# Patient Record
Sex: Male | Born: 1940 | Race: Black or African American | Hispanic: No | Marital: Married | Smoking: Former smoker
Health system: Southern US, Community
[De-identification: ages and names within clinical notes are randomized; demographics above are authoritative.]

## PROBLEM LIST (undated history)

## (undated) DIAGNOSIS — Z8669 Personal history of other diseases of the nervous system and sense organs: Secondary | ICD-10-CM

## (undated) DIAGNOSIS — E119 Type 2 diabetes mellitus without complications: Secondary | ICD-10-CM

## (undated) DIAGNOSIS — I6529 Occlusion and stenosis of unspecified carotid artery: Secondary | ICD-10-CM

## (undated) DIAGNOSIS — E785 Hyperlipidemia, unspecified: Secondary | ICD-10-CM

## (undated) DIAGNOSIS — I10 Essential (primary) hypertension: Secondary | ICD-10-CM

## (undated) HISTORY — DX: Personal history of other diseases of the nervous system and sense organs: Z86.69

## (undated) HISTORY — DX: Occlusion and stenosis of unspecified carotid artery: I65.29

## (undated) HISTORY — DX: Hyperlipidemia, unspecified: E78.5

## (undated) HISTORY — DX: Type 2 diabetes mellitus without complications: E11.9

## (undated) HISTORY — DX: Essential (primary) hypertension: I10

---

## 1998-04-22 ENCOUNTER — Ambulatory Visit (HOSPITAL_COMMUNITY): Admission: RE | Admit: 1998-04-22 | Discharge: 1998-04-22 | Payer: Self-pay | Admitting: General Surgery

## 1999-12-16 ENCOUNTER — Encounter: Payer: Self-pay | Admitting: *Deleted

## 1999-12-16 ENCOUNTER — Inpatient Hospital Stay (HOSPITAL_COMMUNITY): Admission: EM | Admit: 1999-12-16 | Discharge: 1999-12-17 | Payer: Self-pay | Admitting: *Deleted

## 2009-08-12 HISTORY — PX: COLONOSCOPY: SHX174

## 2010-11-17 ENCOUNTER — Ambulatory Visit (INDEPENDENT_AMBULATORY_CARE_PROVIDER_SITE_OTHER): Payer: Medicare Other | Admitting: Family Medicine

## 2010-11-17 ENCOUNTER — Encounter: Payer: Self-pay | Admitting: Family Medicine

## 2010-11-17 VITALS — BP 140/90 | HR 60 | Temp 98.3°F | Ht 67.0 in | Wt 206.0 lb

## 2010-11-17 DIAGNOSIS — M79604 Pain in right leg: Secondary | ICD-10-CM | POA: Insufficient documentation

## 2010-11-17 DIAGNOSIS — R683 Clubbing of fingers: Secondary | ICD-10-CM

## 2010-11-17 DIAGNOSIS — I10 Essential (primary) hypertension: Secondary | ICD-10-CM | POA: Insufficient documentation

## 2010-11-17 DIAGNOSIS — M79609 Pain in unspecified limb: Secondary | ICD-10-CM

## 2010-11-17 DIAGNOSIS — M79606 Pain in leg, unspecified: Secondary | ICD-10-CM

## 2010-11-17 DIAGNOSIS — L608 Other nail disorders: Secondary | ICD-10-CM

## 2010-11-17 MED ORDER — HYDROCHLOROTHIAZIDE 25 MG PO TABS: 25.0000 mg | ORAL_TABLET | Freq: Every day | ORAL | Status: DC

## 2010-11-17 MED ORDER — ATENOLOL 50 MG PO TABS: 50.0000 mg | ORAL_TABLET | Freq: Every day | ORAL | Status: DC

## 2010-11-17 NOTE — Assessment & Plan Note (Signed)
Borderline on check today. On atenolol daily and HCTZ daily. Not big fan of atenolol daily. Will await records and review prior to making any changes today (new pt, first time meeting new doctor). ROS neg.

## 2010-11-17 NOTE — Assessment & Plan Note (Signed)
Will need to check O2 sat next visit, consider xray.

## 2010-11-17 NOTE — Assessment & Plan Note (Signed)
Seems to be stemming from lower back.  Anticipate component of DDD.  No claudication sxs.   Recommended tylenol (pt states no h/o liver problems) instead of alleve to prevent worsening of HTN.   Stretching/strengthening exercises for lower back pain from [facility] pt advisor provided today. If not improved, begin with lumbar xrays. Neuro nonfocal today, no red flags.  Also - followed by urologist for yearly prostate check.  Will see if records in old PCP chart regarding reason for f/u with uro.  No fmhx prostate cancer, denies personal hx cancer.

## 2010-11-17 NOTE — Progress Notes (Addendum)
Subjective:    Patient ID: Jeffrey Villarreal, male    DOB: [birth year], 70 y.o.   MRN: [id number]  HPI CC: new medicare patient, establish  Presents alone today.  Previously saw [facility] on [address].  Leg pain - >1 mo h/o this.  When walking, gets pain lower back L>R then may travel bilateral outer thighs and down to anterior legs.  Worse when standing from sitting as well as first steps.  No numbness or weakness in legs.  Some lower back pain left sided.  No bowel/bladder accidents.  No fevers/chills.  No claudication sxs.  Worse in am first steps stiff.  Takes alleve prn.  No other joint pains, no erythema, swelling or redness of joints.  Preventative: Colonoscopy 05/2010, told good for 10 years. Prostate - gets yearly in September. Gets done at [facility]. Tetanus - unsure Unsure last CPE. Blood work a few months ago.  Medications and allergies reviewed and updated in chart. Past Medical History  Diagnosis Date  . Hypertension    No past surgical history on file.  reports that he quit smoking about 25 years ago. He has never used smokeless tobacco. He reports that he does not drink alcohol or use illicit drugs. family history includes Hypertension in his father and mother.  There is no history of Cancer, and Coronary artery disease, and Stroke, and Diabetes, and Kidney disease, and Hyperlipidemia, . No Known Allergies  Review of Systems  Constitutional: Negative for fever, chills, activity change, appetite change, fatigue and unexpected weight change.  HENT: Negative for hearing loss and neck pain.   Eyes: Negative for visual disturbance.  Respiratory: Negative for cough, choking, chest tightness, shortness of breath and wheezing.   Cardiovascular: Negative for chest pain, palpitations and leg swelling.  Gastrointestinal: Positive for anal bleeding (h/o hemmorhoids, seen at [facility], resolved). Negative for nausea, vomiting, abdominal pain, diarrhea, constipation, blood in stool  and abdominal distention.  Genitourinary: Negative for hematuria and difficulty urinating.  Musculoskeletal: Negative for myalgias and arthralgias.  Skin: Negative for rash.  Neurological: Negative for dizziness, seizures, syncope and headaches.  Hematological: Does not bruise/bleed easily.  Psychiatric/Behavioral: Negative for dysphoric mood. The patient is not nervous/anxious.        Objective:   Physical Exam  Nursing note and vitals reviewed. Constitutional: He is oriented to person, place, and time. He appears well-developed and well-nourished. No distress.  HENT:  Head: Normocephalic and atraumatic.  Right Ear: External ear normal.  Left Ear: External ear normal.  Nose: Nose normal.  Mouth/Throat: Oropharynx is clear and moist.       Cerumen covering TMs bilateraly  Eyes: Conjunctivae and EOM are normal. Pupils are equal, round, and reactive to light.  Neck: Normal range of motion. Neck supple. Carotid bruit is not present. No thyromegaly present.  Cardiovascular: Normal rate, regular rhythm, normal heart sounds and intact distal pulses.   No murmur heard. Pulses:      Radial pulses are 2+ on the right side, and 2+ on the left side.  Pulmonary/Chest: Effort normal and breath sounds normal. No respiratory distress. He has no wheezes. He has no rales.  Abdominal: Soft. Bowel sounds are normal. He exhibits no distension and no mass. There is no tenderness. There is no rebound and no guarding.       No abd/renal bruits  Musculoskeletal: Normal range of motion.       No midline spinal tenderness.  No paraspinous mm tenderness/tightness. No SIJ, GTB pain. Neg SLR  bilaterally. FROM at hip and knee. FROM at spine. Minimal tenderness with testing bilat hips int rotation.  Lymphadenopathy:    He has no cervical adenopathy.  Neurological: He is alert and oriented to person, place, and time. He has normal strength. No sensory deficit. Coordination normal.  Reflex Scores:       Patellar reflexes are 2+ on the right side and 2+ on the left side.      Achilles reflexes are 1+ on the right side and 1+ on the left side.      CN grossly intact, station and gait intact  Skin: Skin is warm and dry. No rash noted.       clubbing  Psychiatric: He has a normal mood and affect. His behavior is normal. Judgment and thought content normal.          Assessment & Plan:

## 2010-11-17 NOTE — Patient Instructions (Addendum)
Return in 1-2 months for blood pressure recheck with me. I will request records from [facility]. Continue meds for now. Call us with questions.  Good to meet you today. For back - tylenol extra strength 500mg  2-3 times daily as needed.  Stretching exercises provided.  Reassess next visit.

## 2010-11-27 ENCOUNTER — Telehealth: Payer: Self-pay | Admitting: *Deleted

## 2010-11-27 MED ORDER — TRAMADOL HCL 50 MG PO TABS: 50.0000 mg | ORAL_TABLET | Freq: Two times a day (BID) | ORAL | Status: AC | PRN

## 2010-11-27 NOTE — Telephone Encounter (Signed)
Patient was seen on 11-18-10. Wife says that the tylenol is not helping with patients back pain at all. She is asking if he can get something called in. Please advise. Uses [facility] on [address].

## 2010-11-27 NOTE — Telephone Encounter (Signed)
Is he taking tylenol 500mg  tid?  If so may increase to 1000mg  tid.  May add alleve to tylenol as well. If not helping, have sent stronger medicine (tramadol) to pharmacy, sedation precautions though.

## 2010-11-27 NOTE — Telephone Encounter (Signed)
Patient notified. He was taking 500 mg tid, he is going to increase to 1000 tid before trying the tramadol.

## 2011-01-22 ENCOUNTER — Ambulatory Visit: Payer: Medicare Other | Admitting: Family Medicine

## 2011-01-25 ENCOUNTER — Ambulatory Visit: Payer: Medicare Other | Admitting: Family Medicine

## 2011-03-18 ENCOUNTER — Ambulatory Visit: Payer: Medicare Other | Admitting: Family Medicine

## 2011-06-15 HISTORY — PX: CATARACT EXTRACTION: SUR2

## 2011-08-05 ENCOUNTER — Other Ambulatory Visit: Payer: Self-pay | Admitting: Family Medicine

## 2011-08-05 NOTE — Telephone Encounter (Signed)
Triage Record Num: [id number] Operator: Jeraldine Loots Patient Name: Jeffrey Villarreal Call Date & Time: 08/05/2011 12:13:31PM Patient Phone: [phone] PCP: Eustaquio Boyden Patient Gender: Male PCP Fax : [phone] Patient DOB: 04-01-1941 Practice Name: [facility] Reason for Call: Caller: Ruby/Spouse; CB#: [phone]. [facility] on [address]. Needs a refill on the HCTZ and Atenolol. He is completely out and has been x 3 days. No future appt. scheduled. She will have him call to make an appt. Protocol(s) Used: Office Note Recommended Outcome per Protocol: Information Noted and Sent to Office Reason for Outcome: Caller information to office

## 2011-08-05 NOTE — Telephone Encounter (Signed)
Already refilled

## 2011-08-26 ENCOUNTER — Encounter: Payer: Self-pay | Admitting: Family Medicine

## 2011-08-26 ENCOUNTER — Ambulatory Visit (INDEPENDENT_AMBULATORY_CARE_PROVIDER_SITE_OTHER): Payer: Medicare Other | Admitting: Family Medicine

## 2011-08-26 VITALS — BP 140/88 | HR 60 | Temp 97.8°F | Wt 201.5 lb

## 2011-08-26 DIAGNOSIS — I10 Essential (primary) hypertension: Secondary | ICD-10-CM

## 2011-08-26 DIAGNOSIS — H6120 Impacted cerumen, unspecified ear: Secondary | ICD-10-CM

## 2011-08-26 DIAGNOSIS — M79606 Pain in leg, unspecified: Secondary | ICD-10-CM

## 2011-08-26 DIAGNOSIS — M79609 Pain in unspecified limb: Secondary | ICD-10-CM

## 2011-08-26 MED ORDER — ATENOLOL 50 MG PO TABS: 50.0000 mg | ORAL_TABLET | Freq: Every day | ORAL | Status: DC

## 2011-08-26 MED ORDER — NAPROXEN 500 MG PO TABS: ORAL_TABLET | ORAL | Status: DC

## 2011-08-26 MED ORDER — HYDROCHLOROTHIAZIDE 25 MG PO TABS: 25.0000 mg | ORAL_TABLET | Freq: Every day | ORAL | Status: DC

## 2011-08-26 NOTE — Assessment & Plan Note (Signed)
Exam today consistent with left sciatica. Doubt stemming from lumbar spine but possibility. No red flags today. Endorses 2 mo h/o sxs. Treat with NSAIDs, stretching exercises for piriformis syndrome provided from [facility] pt advisor. If not better with this, check lumbar xrays, consider PT.

## 2011-08-26 NOTE — Assessment & Plan Note (Signed)
Chronic, stable. Refilled meds.  Due for blood work.  Asked to return fasting for blood work, afterwards for [employer] visit.

## 2011-08-26 NOTE — Progress Notes (Signed)
  Subjective:    Patient ID: Jeffrey Villarreal, male    DOB: 04/11/1941, 71 y.o.   MRN: [id number]  HPI CC: L leg pain, check hearing  HTN - keeps eye at home.  Running ok.  No HA, vision changes, CP/tightness, SOB, leg swelling.  Requests 1 year script renewal of meds.  L leg pain - for 2 months now - last visit here 11/2010 also endorsed leg pain.  This pain starts in lower back and then travels down left leg to mid calf, pain described as ache.  Has been taking tylenol/aleve.  Denies tingling or numbness down leg.  Denies fevers/chills, bowel/bladder incontinence.  Occasionally unstable on left leg.  Denies inciting injury/trauma.  The more he walks the better pain gets.  pain worse when standing up from prolonged sitting.  Hearing - trouble hearing out of left ear.  Normal screen on right today (25Hz  at all decibels), failed left side today.  Endorses blood in stool, told hemorrhoid related.  Colonoscopy done 2 yrs ago by Dr. Elnoria Howard in [place].  Told good for 10 yrs. Prostate - sees urologist at [facility].  Told good exam 02/2011.  Never received records from [facility].  Review of Systems Per HPI    Objective:   Physical Exam  Vitals reviewed. Constitutional: He appears well-developed and well-nourished. No distress.  HENT:  Head: Normocephalic and atraumatic.  Right Ear: Hearing and external ear normal.  Left Ear: External ear normal. Decreased hearing is noted.  Mouth/Throat: Oropharynx is clear and moist. No oropharyngeal exudate.       Bilateral cerumen impaction L>R   Eyes: Conjunctivae and EOM are normal. Pupils are equal, round, and reactive to light. No scleral icterus.  Neck: Normal range of motion. Neck supple. Carotid bruit is not present.  Cardiovascular: Normal rate, regular rhythm, normal heart sounds and intact distal pulses.   No murmur heard. Pulmonary/Chest: Breath sounds normal. No respiratory distress. He has no wheezes. He has no rales.  Abdominal: Soft. There is no  tenderness.       No abd/renal bruits  Musculoskeletal:       No midline spine pain. No paraspinous mm tenderness. No pain with palpation at GTB, SIJ. + tender L sciatic notch. Neg SLR bilaterally. No pain with int/ext rotation at hip. Neg FABER test.  Neurological: He has normal strength. No sensory deficit. Coordination and gait normal.       Decreased DTRs BLE symmetrical  Skin: Skin is warm and dry. No rash noted.       Assessment & Plan:

## 2011-08-26 NOTE — Patient Instructions (Signed)
You're due for physical - return at your convenience for this, return a few days prior fasting for blood work. Sign release form for records of colonoscopy (Dr. Elnoria Howard) and [facility] For hearing - cleaned out left ear. For left leg - I think you have left sciatica.  Treat with course of naprosyn twice daily with food for 5-7 days then as needed.  Do stretching exercises provided.  If not better in 3-4 weeks, let us know.  If any worsening or numbness or weakness of leg, please return sooner. Good to see you today, call us with questions.

## 2011-08-26 NOTE — Assessment & Plan Note (Signed)
L ear canal irrigated.  Resolved hearing loss. Still unable to visualize TM, asked pt to use dilute H2O2 at home 3-4 drops per week for next 1-2 mo. Will recheck next visit.

## 2011-09-15 ENCOUNTER — Other Ambulatory Visit: Payer: Self-pay | Admitting: Family Medicine

## 2011-09-15 DIAGNOSIS — Z125 Encounter for screening for malignant neoplasm of prostate: Secondary | ICD-10-CM

## 2011-09-15 DIAGNOSIS — I10 Essential (primary) hypertension: Secondary | ICD-10-CM

## 2011-09-17 ENCOUNTER — Other Ambulatory Visit (INDEPENDENT_AMBULATORY_CARE_PROVIDER_SITE_OTHER): Payer: Medicare Other

## 2011-09-17 DIAGNOSIS — Z125 Encounter for screening for malignant neoplasm of prostate: Secondary | ICD-10-CM

## 2011-09-17 DIAGNOSIS — I10 Essential (primary) hypertension: Secondary | ICD-10-CM

## 2011-09-17 LAB — CBC WITH DIFFERENTIAL/PLATELET
Basophils Relative: 0.4 % (ref 0.0–3.0)
Eosinophils Absolute: 0.4 10*3/uL (ref 0.0–0.7)
Lymphocytes Relative: 29.5 % (ref 12.0–46.0)
MCHC: 33.1 g/dL (ref 30.0–36.0)
MCV: 85.1 fl (ref 78.0–100.0)
Monocytes Absolute: 0.4 10*3/uL (ref 0.1–1.0)
Neutrophils Relative %: 56.1 % (ref 43.0–77.0)
Platelets: 258 10*3/uL (ref 150.0–400.0)
RBC: 5.02 Mil/uL (ref 4.22–5.81)
WBC: 5.5 10*3/uL (ref 4.5–10.5)

## 2011-09-17 LAB — LIPID PANEL: Cholesterol: 204 mg/dL — ABNORMAL HIGH (ref 0–200)

## 2011-09-17 LAB — COMPREHENSIVE METABOLIC PANEL
ALT: 41 U/L (ref 0–53)
Albumin: 3.8 g/dL (ref 3.5–5.2)
BUN: 15 mg/dL (ref 6–23)
CO2: 27 mEq/L (ref 19–32)
Calcium: 9.1 mg/dL (ref 8.4–10.5)
Chloride: 103 mEq/L (ref 96–112)
Creatinine, Ser: 1.3 mg/dL (ref 0.4–1.5)
GFR: 70.64 mL/min (ref >60.00)
Potassium: 3.7 mEq/L (ref 3.5–5.1)

## 2011-09-17 LAB — LDL CHOLESTEROL, DIRECT: Direct LDL: 144.5 mg/dL

## 2011-09-18 LAB — PSA, MEDICARE: PSA: 1.75 ng/mL (ref <=4.00)

## 2011-09-19 ENCOUNTER — Encounter: Payer: Self-pay | Admitting: Family Medicine

## 2011-09-24 ENCOUNTER — Encounter: Payer: Self-pay | Admitting: Family Medicine

## 2011-09-24 ENCOUNTER — Ambulatory Visit (INDEPENDENT_AMBULATORY_CARE_PROVIDER_SITE_OTHER): Payer: Medicare Other | Admitting: Family Medicine

## 2011-09-24 VITALS — BP 140/80 | HR 58 | Temp 97.5°F | Ht 67.0 in | Wt 200.4 lb

## 2011-09-24 DIAGNOSIS — I10 Essential (primary) hypertension: Secondary | ICD-10-CM

## 2011-09-24 DIAGNOSIS — E1169 Type 2 diabetes mellitus with other specified complication: Secondary | ICD-10-CM | POA: Insufficient documentation

## 2011-09-24 DIAGNOSIS — Z0000 Encounter for general adult medical examination without abnormal findings: Secondary | ICD-10-CM

## 2011-09-24 DIAGNOSIS — H918X9 Other specified hearing loss, unspecified ear: Secondary | ICD-10-CM

## 2011-09-24 DIAGNOSIS — R7309 Other abnormal glucose: Secondary | ICD-10-CM

## 2011-09-24 DIAGNOSIS — R739 Hyperglycemia, unspecified: Secondary | ICD-10-CM

## 2011-09-24 DIAGNOSIS — H6120 Impacted cerumen, unspecified ear: Secondary | ICD-10-CM

## 2011-09-24 DIAGNOSIS — E785 Hyperlipidemia, unspecified: Secondary | ICD-10-CM

## 2011-09-24 NOTE — Progress Notes (Signed)
Subjective:    Patient ID: Jeffrey Villarreal, male    DOB: 1940-08-31, 71 y.o.   MRN: [id number]  HPI CC: medicare wellness  No concerns today.  Leg pain much improved, intermittent use of NSAID.  No falls in last year. Denies depression, anhedonia.  Caffeine: 1 cup coffee. Lives with wife, step-daughter (29yo), no pets Occupation: retired Activity: walks daily Diet: good water, fruits/vegetables daily  Preventative:  Colonoscopy 08/2009, rpt due 10 yrs (Dr. Elnoria Howard) Prostate - gets yearly in September. Gets done at [facility].  No h/o prostate problems, no family hx prostate cancer.  Nocturia x1.  Regular stream. Tetanus - unsure, thinks 3-4 yrs ago (?2009). zostavax - never received.  Not currently intersted Pneumovax - never received.  Discussed reasons to immunize.  Currently declines. Never taken flu shot. Hearing - doing well.  Cerumen disimpaction performed last visit.  Denies problems with hearing. Vision doing well - 07/2011 vision screen, told normal.  Cataract removed this year.  Placed in glasses.  Thinks checked for glaucoma. Advanced directives - no HCPOA . Would want wife to make decisions for him.  Provided with advanced directive handout.  Medications and allergies reviewed and updated in chart.  Past histories reviewed and updated if relevant as below. Patient Active Problem List  Diagnoses  . Hypertension  . Leg pain  . Clubbing of nails  . 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 Hypertension   . Hyperglycemia    Past Surgical History  Procedure Date  . Colonoscopy 08/2009    large int hemorrhoids, benign lymphoid polyp, rpt due 10 yrs Elnoria Howard)   History  Substance Use Topics  . Smoking status: Former Smoker    Quit date: 06/14/1985  . Smokeless tobacco: Never Used  . Alcohol Use: No     Quit 1987   Family History  Problem Relation Age of Onset  . Hypertension Mother   . Hypertension Father   .  Cancer Neg Hx   . Coronary artery disease Neg Hx   . Stroke Neg Hx   . Diabetes Neg Hx   . Kidney disease Neg Hx   . Hyperlipidemia Neg Hx    No Known Allergies Current Outpatient Prescriptions on File Prior to Visit  Medication Sig Dispense Refill  . atenolol (TENORMIN) 50 MG tablet Take 1 tablet (50 mg total) by mouth daily.  90 tablet  3  . hydrochlorothiazide (HYDRODIURIL) 25 MG tablet Take 1 tablet (25 mg total) by mouth daily.  90 tablet  3  . naproxen (NAPROSYN) 500 MG tablet Take one po bid x 1 week then prn pain, take with food  60 tablet  0    Review of Systems  Constitutional: Negative for fever, chills, activity change, appetite change, fatigue and unexpected weight change.  HENT: Negative for hearing loss and neck pain.   Eyes: Negative for visual disturbance.  Respiratory: Negative for cough, chest tightness, shortness of breath and wheezing.   Cardiovascular: Negative for chest pain, palpitations and leg swelling.  Gastrointestinal: Negative for nausea, vomiting, abdominal pain, diarrhea, constipation, blood in stool and abdominal distention.  Genitourinary: Negative for hematuria and difficulty urinating.  Musculoskeletal: Negative for myalgias and arthralgias.  Skin: Negative for rash.  Neurological: Negative for dizziness, seizures, syncope and headaches.  Hematological: Does not bruise/bleed easily.  Psychiatric/Behavioral: Negative for dysphoric mood. The patient is not nervous/anxious.        Objective:   Physical Exam  Nursing note and vitals reviewed. Constitutional: He is oriented to person, place, and time. He appears well-developed and well-nourished. No distress.  HENT:  Head: Normocephalic and atraumatic.  Right Ear: External ear normal.  Left Ear: Tympanic membrane and external ear normal.  Nose: Nose normal. No mucosal edema or rhinorrhea.  Mouth/Throat: Uvula is midline, oropharynx is clear and moist and mucous membranes are normal. No  oropharyngeal exudate, posterior oropharyngeal edema, posterior oropharyngeal erythema or tonsillar abscesses.       Cerumen impaction bilaterally but able to visualize L TM.  Attempted cerumen disimpaction with curette, pt did not tolerate well so D/C without full cleaning.  Eyes: Conjunctivae and EOM are normal. Pupils are equal, round, and reactive to light. No scleral icterus.  Neck: Normal range of motion. Neck supple. Carotid bruit is not present.  Cardiovascular: Normal rate, regular rhythm, normal heart sounds and intact distal pulses.   No murmur heard. Pulses:      Radial pulses are 2+ on the right side, and 2+ on the left side.  Pulmonary/Chest: Effort normal and breath sounds normal. No respiratory distress. He has no wheezes. He has no rales.  Abdominal: Soft. Bowel sounds are normal. He exhibits no distension and no mass. There is no tenderness. There is no rebound and no guarding.  Genitourinary:       Deferred: per pt gets yearly exam with urology every september  Musculoskeletal: Normal range of motion. He exhibits no edema.  Lymphadenopathy:    He has no cervical adenopathy.  Neurological: He is alert and oriented to person, place, and time.       CN grossly intact, station and gait intact  Skin: Skin is warm and dry. No rash noted.  Psychiatric: He has a normal mood and affect. His behavior is normal. Judgment and thought content normal.      Assessment & Plan:

## 2011-09-24 NOTE — Patient Instructions (Addendum)
Low cholesterol diet handout provided. Sugar was too high - possible prediabetes - work on backing off of sweets, sugars, too many carbs. Look into information on advanced directives provided today. Return in 6 months for follow up, a few days prior for fasting blood work to recheck sugar and cholesterol. you did not hear well out of left ear today but I was unable to fully clean out all the wax in your ears. Let us know how ears are doing - if noted worsening hearing, let me know for referral to ENT doctor. Good to see you today, call us with questions.

## 2011-09-25 DIAGNOSIS — E785 Hyperlipidemia, unspecified: Secondary | ICD-10-CM | POA: Insufficient documentation

## 2011-09-25 DIAGNOSIS — Z0000 Encounter for general adult medical examination without abnormal findings: Secondary | ICD-10-CM | POA: Insufficient documentation

## 2011-09-25 NOTE — Assessment & Plan Note (Signed)
Again unable to fully clean ears with curette 2/2 poor tolerance by patient. Last visit passed hearing screen, this visit fails. Anticipate due to cerumen. Discussed referrral to ENT for cleaning, as pt doesn't notice problem with hearing, prefers to monitor for now.   If changes mind, will let me know.

## 2011-09-25 NOTE — Assessment & Plan Note (Addendum)
Reviewed blood work, discussed goal LDL <130. rtc 6 mo for recheck. Discussed low chol diet and provided with handout

## 2011-09-25 NOTE — Assessment & Plan Note (Signed)
Discussed elevated sugar - return in 6 months for recheck chol and sugar - check A1c. rec dietary changes to lower glucose.

## 2011-09-25 NOTE — Assessment & Plan Note (Addendum)
I have personally reviewed the Medicare Annual Wellness questionnaire and have noted 1. The patient's medical and social history 2. Their use of alcohol, tobacco or illicit drugs 3. Their current medications and supplements 4. The patient's functional ability including ADL's, fall risks, home safety risks and hearing or visual impairment. 5. Diet and physical activities 6. Evidence for depression or mood disorders The patients weight, height, BMI have been recorded in the chart.  Hearing and vision has been addressed. I have made referrals, counseling and provided education to the patient based review of the above and I have provided the pt with a written personalized care plan for preventive services. See scanned form.  Preventative protocols reviewed and updated unless pt declined. Blood work reviewed in detail. Hearing- see below. Vision - gets yearly ophtho screen since cataract surgery. Prostate exam with [facility] - will fax PSA result to them. Return in 6 months for recheck glu and LDL.

## 2011-09-25 NOTE — Assessment & Plan Note (Signed)
Stable on current regimen, continue. 

## 2011-11-05 ENCOUNTER — Ambulatory Visit (INDEPENDENT_AMBULATORY_CARE_PROVIDER_SITE_OTHER)
Admission: RE | Admit: 2011-11-05 | Discharge: 2011-11-05 | Disposition: A | Discharge status: Home / Self Care | Payer: Medicare Other | Source: Ambulatory Visit | Attending: Family Medicine | Admitting: Family Medicine

## 2011-11-05 ENCOUNTER — Encounter: Payer: Self-pay | Admitting: Family Medicine

## 2011-11-05 ENCOUNTER — Ambulatory Visit (INDEPENDENT_AMBULATORY_CARE_PROVIDER_SITE_OTHER): Payer: Medicare Other | Admitting: Family Medicine

## 2011-11-05 VITALS — BP 114/72 | HR 60 | Temp 98.2°F | Wt 205.5 lb

## 2011-11-05 DIAGNOSIS — M79605 Pain in left leg: Secondary | ICD-10-CM

## 2011-11-05 DIAGNOSIS — M79609 Pain in unspecified limb: Secondary | ICD-10-CM

## 2011-11-05 DIAGNOSIS — M79604 Pain in right leg: Secondary | ICD-10-CM

## 2011-11-05 MED ORDER — CYCLOBENZAPRINE HCL 10 MG PO TABS: 10.0000 mg | ORAL_TABLET | Freq: Two times a day (BID) | ORAL | Status: AC | PRN

## 2011-11-05 MED ORDER — NAPROXEN 500 MG PO TABS: 500.0000 mg | ORAL_TABLET | Freq: Two times a day (BID) | ORAL | Status: DC

## 2011-11-05 NOTE — Patient Instructions (Addendum)
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 Treat with flexeril (muscle relaxant that may make you sleepy so take at home and don't drive with this) and refilled naprosyn.  If naprosyn is not helping, let me know for stronger pain medicine. Pass by Marion's office for referral to physical therapy. I also recommend starting regular pool exercise as this will likely help overall. Return in 1-2 months for follow up.

## 2011-11-05 NOTE — Progress Notes (Signed)
  Subjective:    Patient ID: Jeffrey Villarreal, male    DOB: June 14, 1941, 71 y.o.   MRN: [id number]  HPI CC: leg pains  Longstanding h/o leg pains (since at least 11/2010).  Initially left sided thought sciatica, now bilateral.  Exacerbated over last 2-3 wks.  Denies falls/injury to incite.  Pain starts lower left back, travels down lateral legs bilaterally, goes down to knees.  Prior treated with naprosyn and improved.  Worse with more pressure on legs.  Worst pain is at lower buttock.  Walking improves pain.  Laying down with pressure on buttocks causes worsening of pain.  Currently taking naprosyn some.  Denies leg numbness or weakness.  No fevers/chills, bowel/bladder incontinence.  No nausea/vomiting.  [facility]  Wt Readings from Last 3 Encounters:  11/05/11 205 lb 8 oz (93.214 kg)  09/24/11 200 lb 6.4 oz (90.901 kg)  08/26/11 201 lb 8 oz (91.4 kg)    No h/o xrays per pt.  Past Medical History  Diagnosis Date  . Hypertension   . Hyperglycemia     Review of Systems Per HPI    Objective:   Physical Exam  Nursing note and vitals reviewed. Constitutional: He appears well-developed and well-nourished. No distress.  Musculoskeletal:       Midline mid lumbar pain to palpation as well as left paraspinous mm tenderness and tightness. All muscles significantly tight, but no outright spasm appreciated. Mildly tender at SIJ, GTB, and sciatic notch bilaterally. Neg FABER. Mildly tender at hip with int/ext rotation bilaterally Neg SLR bilaterally.  Neurological: He has normal strength. No sensory deficit. Gait (stiff) abnormal. Coordination normal.       Diminished DTRs throughout upper and lower extremities Able to heel and toe walk.  Heel walk elicits pain.       Assessment & Plan:

## 2011-11-05 NOTE — Assessment & Plan Note (Addendum)
Longstanding leg pain, waxing and waning, initially left now bilateral. Prior though sciatica and did improve with naprosyn. No red flags on story or neurological deficits on exam. Will start with lumbar spine film to eval arthritic load, and refer to physical therapy. Significant muscle tightness throughout exam of lower extremities.  Will treat with naprosyn refill and flexeril - sedation precautions discussed. Return in 1-2 mo for f/u.  If no better, consider referral. Lab Results  Component Value Date   CREATININE 1.3 09/17/2011

## 2012-01-07 ENCOUNTER — Ambulatory Visit: Payer: Medicare Other | Admitting: Family Medicine

## 2012-01-17 ENCOUNTER — Ambulatory Visit: Payer: Medicare Other | Admitting: Family Medicine

## 2012-03-24 ENCOUNTER — Other Ambulatory Visit: Payer: Medicare Other

## 2012-03-31 ENCOUNTER — Ambulatory Visit: Payer: Medicare Other | Admitting: Family Medicine

## 2012-05-17 ENCOUNTER — Ambulatory Visit: Payer: Medicare Other | Admitting: Family Medicine

## 2012-08-28 ENCOUNTER — Other Ambulatory Visit: Payer: Self-pay | Admitting: Family Medicine

## 2012-08-28 ENCOUNTER — Telehealth: Payer: Self-pay

## 2012-08-28 NOTE — Telephone Encounter (Signed)
Pt called to request refill of bp meds until 07/14 appt. Advised pt he has fasting lab appt on 09/21/12 at 9:15 am and 09/28/12 CPX'; refill of bp meds already sent to [facility]. Pt voiced understanding.

## 2012-09-21 ENCOUNTER — Other Ambulatory Visit (INDEPENDENT_AMBULATORY_CARE_PROVIDER_SITE_OTHER): Payer: Medicare Other

## 2012-09-21 DIAGNOSIS — R7309 Other abnormal glucose: Secondary | ICD-10-CM

## 2012-09-21 DIAGNOSIS — E785 Hyperlipidemia, unspecified: Secondary | ICD-10-CM

## 2012-09-21 DIAGNOSIS — R739 Hyperglycemia, unspecified: Secondary | ICD-10-CM

## 2012-09-21 LAB — BASIC METABOLIC PANEL
BUN: 18 mg/dL (ref 6–23)
Calcium: 9.2 mg/dL (ref 8.4–10.5)
GFR: 68.59 mL/min (ref >60.00)
Glucose, Bld: 116 mg/dL — ABNORMAL HIGH (ref 70–99)
Potassium: 4.1 mEq/L (ref 3.5–5.1)
Sodium: 138 mEq/L (ref 135–145)

## 2012-09-21 LAB — LDL CHOLESTEROL, DIRECT: Direct LDL: 213.6 mg/dL

## 2012-09-28 ENCOUNTER — Ambulatory Visit (INDEPENDENT_AMBULATORY_CARE_PROVIDER_SITE_OTHER): Payer: Medicare Other | Admitting: Family Medicine

## 2012-09-28 ENCOUNTER — Encounter: Payer: Self-pay | Admitting: Family Medicine

## 2012-09-28 VITALS — BP 140/80 | HR 60 | Temp 97.9°F | Ht 66.0 in | Wt 204.5 lb

## 2012-09-28 DIAGNOSIS — E119 Type 2 diabetes mellitus without complications: Secondary | ICD-10-CM

## 2012-09-28 DIAGNOSIS — E785 Hyperlipidemia, unspecified: Secondary | ICD-10-CM

## 2012-09-28 DIAGNOSIS — I10 Essential (primary) hypertension: Secondary | ICD-10-CM

## 2012-09-28 DIAGNOSIS — Z0000 Encounter for general adult medical examination without abnormal findings: Secondary | ICD-10-CM

## 2012-09-28 MED ORDER — ATENOLOL 50 MG PO TABS: ORAL_TABLET | ORAL | Status: DC

## 2012-09-28 MED ORDER — HYDROCHLOROTHIAZIDE 25 MG PO TABS: ORAL_TABLET | ORAL | Status: DC

## 2012-09-28 NOTE — Progress Notes (Signed)
Subjective:    Patient ID: Jeffrey Villarreal, male    DOB: [birth year], 72 y.o.   MRN: [id number]  HPI CC: medicare wellness  Wt Readings from Last 3 Encounters:  09/28/12 204 lb 8 oz (92.761 kg)  11/05/11 205 lb 8 oz (93.214 kg)  09/24/11 200 lb 6.4 oz (90.901 kg)   Body mass index is 33.02 kg/(m^2).  No falls in last year. Denies depression, anhedonia. Hearing - failed L side today.  H/o cerumen impaction, will irrigate today.  Denies problems with hearing. Vision doing well - failed left eye.  S/p R cataract extraction.  Pending L cataract surgery ([facility]).  Caffeine: 1 cup coffee.  Lives with wife, step-daughter (72yo), no pets  Occupation: retired  Activity: walks daily  Diet: good water, fruits/vegetables daily  Seal belt use 100%.  Preventative:  Colonoscopy 08/2009, rpt due 10 yrs (Dr. Elnoria Howard)  Prostate - gets yearly in September. Gets done at [facility] u[facility]. No h/o prostate problems, no family hx prostate cancer. Nocturia x1. Regular stream.  Tetanus - unsure, thinks 4-5 yrs ago (?2009).  zostavax - never received. Not currently interested  Pneumovax - never received. Discussed reasons to immunize. Currently declines.  Never taken flu shot. Advanced directives - no HCPOA . Would want wife to make decisions for him. Provided with advanced directive handout.  Medications and allergies reviewed and updated in chart.  Past histories reviewed and updated if relevant as below. Patient Active Problem List  Diagnosis  . Hypertension  . Leg pain, bilateral  . Clubbing of nails  . 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 Hyperglycemia  . Medicare annual wellness visit, initial  . Hyperlipidemia, mild off meds   Past Medical History  Diagnosis Date  . Hypertension   . Hyperglycemia    Past Surgical History  Procedure Laterality Date  . Colonoscopy  08/2009    large int hemorrhoids, benign lymphoid polyp, rpt due 10 yrs Elnoria Howard)   History  Substance Use Topics  .  Smoking status: Former Smoker    Quit date: 06/14/1985  . Smokeless tobacco: Never Used  . Alcohol Use: No     Comment: Quit 1987   Family History  Problem Relation Age of Onset  . Hypertension Mother   . Hypertension Father   . Cancer Neg Hx   . Coronary artery disease Neg Hx   . Stroke Neg Hx   . Diabetes Neg Hx   . Kidney disease Neg Hx   . Hyperlipidemia Neg Hx    No Known Allergies Current Outpatient Prescriptions on File Prior to Visit  Medication Sig Dispense Refill  . atenolol (TENORMIN) 50 MG tablet TAKE ONE TABLET BY MOUTH EVERY DAY  30 tablet  0  . hydrochlorothiazide (HYDRODIURIL) 25 MG tablet TAKE ONE TABLET BY MOUTH EVERY DAY  30 tablet  0   No current facility-administered medications on file prior to visit.     Review of Systems  Constitutional: Negative for fever, chills, activity change, appetite change, fatigue and unexpected weight change.  HENT: Negative for hearing loss and neck pain.   Eyes: Negative for visual disturbance.  Respiratory: Negative for cough (light), chest tightness, shortness of breath and wheezing.   Cardiovascular: Negative for chest pain, palpitations and leg swelling.  Gastrointestinal: Negative for nausea, vomiting, abdominal pain, diarrhea, constipation, blood in stool and abdominal distention.  Genitourinary: Negative for hematuria and difficulty urinating.  Musculoskeletal: Negative for myalgias and arthralgias.  Skin: Negative for rash.  Neurological: Negative  for dizziness, seizures, syncope and headaches.  Hematological: Does not bruise/bleed easily.  Psychiatric/Behavioral: Negative for dysphoric mood. The patient is not nervous/anxious.        Objective:   Physical Exam  Nursing note and vitals reviewed. Constitutional: He is oriented to person, place, and time. He appears well-developed and well-nourished. No distress.  HENT:  Head: Normocephalic and atraumatic.  Right Ear: Hearing and external ear normal.  Left  Ear: Hearing and external ear normal.  Nose: Nose normal.  Mouth/Throat: Oropharynx is clear and moist. No oropharyngeal exudate.  Cerumen impaction bilaterally L>R  Eyes: Conjunctivae and EOM are normal. Pupils are equal, round, and reactive to light. No scleral icterus.  Neck: Normal range of motion. Neck supple. Carotid bruit is not present. No thyromegaly present.  Cardiovascular: Normal rate, regular rhythm, normal heart sounds and intact distal pulses.   No murmur heard. Pulses:      Radial pulses are 2+ on the right side, and 2+ on the left side.  Pulmonary/Chest: Effort normal and breath sounds normal. No respiratory distress. He has no wheezes. He has no rales.  Abdominal: Soft. Bowel sounds are normal. He exhibits no distension and no mass. There is no tenderness. There is no rebound and no guarding.  Genitourinary:  Deferred - gets done at [facility]  Musculoskeletal: Normal range of motion. He exhibits no edema.  Lymphadenopathy:    He has no cervical adenopathy.  Neurological: He is alert and oriented to person, place, and time.  CN grossly intact, station and gait intact  Skin: Skin is warm and dry. No rash noted.  Psychiatric: He has a normal mood and affect. His behavior is normal. Judgment and thought content normal.       Assessment & Plan:

## 2012-09-28 NOTE — Patient Instructions (Addendum)
Start aspirin 81mg  enteric coated daily. Let's start colace or docusate over the counter (stool softeners) ok to take daily. Sugar returned elevated with A1c 6.6% - concern for developing diabetes.  Work on avoiding added sugars and simple carbs (white bread, pasta, rice and potatoes - change to whole grain bread, whole wheat pasta, brown rice, sweet potatoes). Cholesterol was also very high - low chol hand out provided. Advance directive packet provided. Return in 4 months for fasting blood work , and afterwards for follow up visit to discuss sugar and cholesterol levels

## 2012-09-29 ENCOUNTER — Encounter: Payer: Self-pay | Admitting: Family Medicine

## 2012-09-29 NOTE — Assessment & Plan Note (Signed)
I have personally reviewed the Medicare Annual Wellness questionnaire and have noted 1. The patient's medical and social history 2. Their use of alcohol, tobacco or illicit drugs 3. Their current medications and supplements 4. The patient's functional ability including ADL's, fall risks, home safety risks and hearing or visual impairment. 5. Diet and physical activity 6. Evidence for depression or mood disorders The patients weight, height, BMI have been recorded in the chart.  Hearing and vision has been addressed. I have made referrals, counseling and provided education to the patient based review of the above and I have provided the pt with a written personalized care plan for preventive services. See scanned questionairre. Advanced directives discussed: provided with handout.  Reviewed preventative protocols and updated unless pt declined. Hearing - did not perform irrigation - will need next visit along with hearing recheck.  Pt denies problems with hearing. Vision failed, pending cataract surgery. Prostate exam with [facility]. UTD colonoscopy.

## 2012-09-29 NOTE — Assessment & Plan Note (Signed)
Chronic, stable. Continue med. BP Readings from Last 3 Encounters:  09/28/12 140/80  11/05/11 114/72  09/24/11 140/80

## 2012-09-29 NOTE — Assessment & Plan Note (Signed)
Reviewed direct LDL of 200s, discussed goal in diabetes <100. Provided with low chol handout, if not better by next visit, discussed will recommend starting statin.

## 2012-09-29 NOTE — Assessment & Plan Note (Signed)
Has developed diabetes with A1c 6.6%.  Discussed this. Discussed dietary change necessary for diabetes to remain controlled. rtc 4 mo to recheck and further discuss, consider referring to diabetes education.

## 2013-01-30 ENCOUNTER — Ambulatory Visit: Payer: Medicare Other | Admitting: Family Medicine

## 2013-02-01 ENCOUNTER — Ambulatory Visit (INDEPENDENT_AMBULATORY_CARE_PROVIDER_SITE_OTHER): Payer: Medicare Other | Admitting: Family Medicine

## 2013-02-01 ENCOUNTER — Other Ambulatory Visit: Payer: Self-pay | Admitting: Family Medicine

## 2013-02-01 ENCOUNTER — Encounter: Payer: Self-pay | Admitting: Family Medicine

## 2013-02-01 VITALS — BP 146/92 | HR 60 | Temp 98.3°F | Wt 202.2 lb

## 2013-02-01 DIAGNOSIS — E785 Hyperlipidemia, unspecified: Secondary | ICD-10-CM

## 2013-02-01 DIAGNOSIS — I10 Essential (primary) hypertension: Secondary | ICD-10-CM

## 2013-02-01 DIAGNOSIS — E119 Type 2 diabetes mellitus without complications: Secondary | ICD-10-CM

## 2013-02-01 LAB — BASIC METABOLIC PANEL
BUN: 12 mg/dL (ref 6–23)
CO2: 30 mEq/L (ref 19–32)
Chloride: 98 mEq/L (ref 96–112)
Creatinine, Ser: 1.3 mg/dL (ref 0.4–1.5)
Glucose, Bld: 98 mg/dL (ref 70–99)
Potassium: 3.5 mEq/L (ref 3.5–5.1)

## 2013-02-01 LAB — LIPID PANEL: Cholesterol: 238 mg/dL — ABNORMAL HIGH (ref 0–200)

## 2013-02-01 MED ORDER — ATORVASTATIN CALCIUM 40 MG PO TABS: 40.0000 mg | ORAL_TABLET | Freq: Every day | ORAL | Status: DC

## 2013-02-01 MED ORDER — LISINOPRIL 10 MG PO TABS: 10.0000 mg | ORAL_TABLET | Freq: Every day | ORAL | Status: DC

## 2013-02-01 NOTE — Assessment & Plan Note (Signed)
Recent onset diabetes - discussed dx. Recheck A1c today - if persistently >6.5% - will refer to diabetes education. Pt agrees with plan. Discussed pathophysiology of disease as well as monitoring parameters. No glucometer for now.

## 2013-02-01 NOTE — Progress Notes (Signed)
  Subjective:    Patient ID: Jeffrey Villarreal, male    DOB: 11-27-1940, 72 y.o.   MRN: [id number]  HPI CC: 4 mo f/u  At last medicare wellness visit, was found to have new dx DM with A1c 6.6%.  Presents today for f/u Lab Results  Component Value Date   HGBA1C 6.6* 09/21/2012    DM - denies paresthesias.  Does not check sugars.  Recent dx.  No recent eye or foot exam.  HTN - No HA, vision changes, CP/tightness, SOB, leg swelling. Compliant with atenolol and hydrochlorothiazide once daily. BP Readings from Last 3 Encounters:  02/01/13 146/92  09/28/12 140/80  11/05/11 114/72    HLD - LDL found to be 200s last check, prior 140s.  Endorses improving diet slowly.  Wt Readings from Last 3 Encounters:  02/01/13 202 lb 4 oz (91.74 kg)  09/28/12 204 lb 8 oz (92.761 kg)  11/05/11 205 lb 8 oz (93.214 kg)    Past Medical History  Diagnosis Date  . Hypertension   . Diet-controlled type 2 diabetes mellitus   . Hyperlipidemia      Review of Systems Per HPI    Objective:   Physical Exam  Nursing note and vitals reviewed. Constitutional: He appears well-developed and well-nourished. No distress.  HENT:  Head: Normocephalic and atraumatic.  Mouth/Throat: Oropharynx is clear and moist. No oropharyngeal exudate.  Eyes: Conjunctivae and EOM are normal. Pupils are equal, round, and reactive to light. No scleral icterus.  Neck: Normal range of motion. Neck supple.  Cardiovascular: Normal rate, regular rhythm, normal heart sounds and intact distal pulses.   No murmur heard. Pulmonary/Chest: Effort normal and breath sounds normal. No respiratory distress. He has no wheezes. He has no rales.  Musculoskeletal: He exhibits no edema.  Diabetic foot exam: skin breakdown between 4th/5th digits bilateraly calluses  Present bilaterally Normal DP/PT pulses Normal sensation to light touch and monofilament Nails thickened elongated and onychomycotic  Skin: Skin is warm and dry. No rash noted.   Psychiatric: He has a normal mood and affect.       Assessment & Plan:

## 2013-02-01 NOTE — Assessment & Plan Note (Signed)
Chronic, elevated today. Recommended start lisinopril 10mg  daily.discussed common side effects including but not limited to cough, angioedema. Return in 10d for recheck Cr.  Check Cr today. Hopeful to transition to ACEI/HCTZ combo pill alone for blood pressure control.

## 2013-02-01 NOTE — Patient Instructions (Signed)
Start lisinopril at 10 mg daily.  Watch for dry cough (side effect).  If any swelling, stop right away and let us know. Prescription for lipitor 40 (atorvatatin) to take after you've been on new blood pressure medicine for at least 1 month. We will draw blood work today to check kidneys and sugar. Return in 10 days to recheck kidney function. Good to see you today, call us with questions. Return to see me in 4 months.

## 2013-02-01 NOTE — Assessment & Plan Note (Signed)
Very high on last check - discussed statin. Will start ACEI and 1 mo after this start statin.  Provided with prescription for lipitor 40mg 

## 2013-02-08 ENCOUNTER — Other Ambulatory Visit: Payer: Self-pay | Admitting: Family Medicine

## 2013-02-08 DIAGNOSIS — E119 Type 2 diabetes mellitus without complications: Secondary | ICD-10-CM

## 2013-02-08 DIAGNOSIS — I10 Essential (primary) hypertension: Secondary | ICD-10-CM

## 2013-02-13 ENCOUNTER — Other Ambulatory Visit (INDEPENDENT_AMBULATORY_CARE_PROVIDER_SITE_OTHER): Payer: Medicare Other

## 2013-02-13 ENCOUNTER — Telehealth: Payer: Self-pay | Admitting: *Deleted

## 2013-02-13 DIAGNOSIS — I10 Essential (primary) hypertension: Secondary | ICD-10-CM

## 2013-02-13 NOTE — Telephone Encounter (Signed)
Pt came in c/o severe dizziness for one day after taking lisinopril 10 mg once, over a week ago. He took the pill between 9:30 and 10am  and c/o dizziness until the next day. He d/c med after that. He says he has been checking his blood pressure at home and it "hasn't been too bad", but he couldn't recall any actual readings.  Pt also had labs today to check creatinine, should it still be sent out since he only took med for one day? Please advise?

## 2013-02-13 NOTE — Telephone Encounter (Signed)
We do not need to send lab off as pt did not take lisinopril regularly. I'd suggest we slowly take him off atenolol - he should take 1/2 tablet of atenolol 50mg  for 1 week then stop, and start 1/2 tablet of lisinopril 10mg  daily. Return in 10 days to recheck kidney levels

## 2013-02-15 ENCOUNTER — Encounter: Payer: Self-pay | Admitting: *Deleted

## 2013-02-15 NOTE — Telephone Encounter (Signed)
Pt and his wife advised, he will call back to schedule appt. The labs were already sent in, but I will send in a test credit request. I will also mail a copy of the new med instructions to him.

## 2013-02-15 NOTE — Telephone Encounter (Signed)
Noted. Thanks.

## 2013-02-16 ENCOUNTER — Encounter: Payer: Self-pay | Admitting: *Deleted

## 2013-02-26 LAB — PSA: PSA: 0.49

## 2013-03-13 ENCOUNTER — Encounter: Payer: Medicare Other | Attending: Family Medicine | Admitting: *Deleted

## 2013-03-13 ENCOUNTER — Encounter: Payer: Self-pay | Admitting: *Deleted

## 2013-03-13 VITALS — Ht 67.5 in | Wt 199.6 lb

## 2013-03-13 DIAGNOSIS — E119 Type 2 diabetes mellitus without complications: Secondary | ICD-10-CM | POA: Insufficient documentation

## 2013-03-13 DIAGNOSIS — Z713 Dietary counseling and surveillance: Secondary | ICD-10-CM | POA: Insufficient documentation

## 2013-03-13 NOTE — Progress Notes (Signed)
Appt start time: 1130 end time:  1300.  Assessment:  Patient was seen on  03/13/13 for individual diabetes education. Lives with wife Mora Bellman who is here with him, and daughter. Daughter shops for food and he prepares the meals. He cares for his mother in her own home from 9:30 to 4:30 most days of the week. Newly diagnosed with diabetes, not monitoring BG yet.   Current HbA1c: 6.6%  MEDICATIONS: see list   DIETARY INTAKE:  Usual eating pattern includes 3 meals and 0-2 snacks per day.  Everyday foods include easily prepared foods.  Avoided foods include white bread, most starchy foods since diagnosed with DM.    24-hr recall:  B ( AM): bologna sandwich to eat with medications in AM, regular soda, coffee with Splenda or juice  Snk ( AM): occasionally another sandwich  L ( PM): varies daily, fresh fruit, snack foods at [employer] house Snk ( PM): same as AM D ( PM): lean meat, vegetables, occasionally bread, regular clear soda or lite fruit juice Snk ( PM): snack foods or sandwich Beverages: soda, coffee or fruit juice  Usual physical activity: nothing lately. Wife is a member of [employer].  Estimated energy needs: 1500 calories 170 g carbohydrates 112 g protein 42 g fat  Progress Towards Goal(s):  In progress.   Nutritional Diagnosis:  NB-1.1 Food and nutrition-related knowledge deficit As related to new diagnosis of DM2.  As evidenced by A1c of 6.6%.    Intervention:  Nutrition counseling provided.  Discussed diabetes disease process and treatment options.  Discussed physiology of diabetes and role of obesity on insulin resistance.  Encouraged moderate weight reduction to improve glucose levels.  Discussed role of medications and diet in glucose control  Provided education on macronutrients on glucose levels.  Provided education on carb counting, importance of regularly scheduled meals/snacks, and meal planning  Discussed effects of physical activity on glucose levels and  long-term glucose control.  Recommended 150 minutes of physical activity/week.  Discussed blood glucose monitoring and interpretation.  Discussed recommended target ranges and individual ranges.    Provided info on short-term complications: hyper- and hypo-glycemia.  Discussed causes,symptoms, and treatment options.  Provided info on prevention, detection, and treatment of long-term complications.  Discussed the role of prolonged elevated glucose levels on body systems.  Provided info on role of stress on blood glucose levels and discussed strategies to manage psychosocial issues.  Provided info on recommendations for long-term diabetes self-care.  Established checklist for medical, dental, and emotional self-care. Plan:  Aim for 3 Carb Choices per meal (45 grams) +/- 1 either way  Aim for 0-2 Carbs per snack if hungry  Consider options on how you would like to increase your activity level by walking daily as tolerated  Handouts given during visit include: Living Well with Diabetes Carb Counting and Food Label handouts Meal Plan Card  Barriers to learning/adherance to lifestyle change: patient dealing with new diagnosis of diabetes  Diabetes self-care support plan:   [facility] support group available  Monitoring/Evaluation:  Dietary intake, exercise, reading food labels, and body weight prn. Patient to call after next A1c as needed

## 2013-03-13 NOTE — Patient Instructions (Addendum)
Plan:  Aim for 3 Carb Choices per meal (45 grams) +/- 1 either way  Aim for 0-2 Carbs per snack if hungry  Consider options on how you would like to increase your activity level by walking daily as tolerated

## 2013-03-14 ENCOUNTER — Encounter: Payer: Self-pay | Admitting: *Deleted

## 2013-03-20 ENCOUNTER — Ambulatory Visit (INDEPENDENT_AMBULATORY_CARE_PROVIDER_SITE_OTHER): Payer: Medicare Other | Admitting: Family Medicine

## 2013-03-20 ENCOUNTER — Encounter: Payer: Self-pay | Admitting: Family Medicine

## 2013-03-20 VITALS — BP 118/72 | HR 64 | Temp 98.1°F | Wt 197.8 lb

## 2013-03-20 DIAGNOSIS — E785 Hyperlipidemia, unspecified: Secondary | ICD-10-CM

## 2013-03-20 DIAGNOSIS — E119 Type 2 diabetes mellitus without complications: Secondary | ICD-10-CM

## 2013-03-20 DIAGNOSIS — I10 Essential (primary) hypertension: Secondary | ICD-10-CM

## 2013-03-20 MED ORDER — LISINOPRIL 5 MG PO TABS: 5.0000 mg | ORAL_TABLET | Freq: Every day | ORAL | Status: DC

## 2013-03-20 MED ORDER — HYDROCHLOROTHIAZIDE 25 MG PO TABS: ORAL_TABLET | ORAL | Status: DC

## 2013-03-20 MED ORDER — ATORVASTATIN CALCIUM 40 MG PO TABS: 40.0000 mg | ORAL_TABLET | Freq: Every day | ORAL | Status: DC

## 2013-03-20 NOTE — Progress Notes (Signed)
  Subjective:    Patient ID: Jeffrey Villarreal, male    DOB: 12-11-1940, 72 y.o.   MRN: [id number]  HPI CC: DM f/u 2 mo  At last medicare wellness visit, was found to have new dx DM with A1c 6.6%. Presents today for f/u. Lab Results   Component  Value  Date    HGBA1C  6.6*  09/21/2012   DM - denies paresthesias. Does not check sugars. Recent dx - no meds needed. No recent eye exam.   HTN - No HA, vision changes, CP/tightness, SOB, leg swelling. Compliant with lisinopril 5mg  and hydrochlorothiazide once daily.   HLD- LDL found to be 200s last check, prior 140s. Endorses improving diet slowly.  Has not started lipitor.  Declines flu and pneumonia shots today.  Past Medical History  Diagnosis Date  . Hypertension   . Diet-controlled type 2 diabetes mellitus     DSME 02/2013  . Hyperlipidemia     Past Surgical History  Procedure Laterality Date  . Colonoscopy  08/2009    large int hemorrhoids, benign lymphoid polyp, rpt due 10 yrs Elnoria Howard)   Review of Systems Per HPI    Objective:   Physical Exam  Nursing note and vitals reviewed. Constitutional: He appears well-developed and well-nourished.  HENT:  Mouth/Throat: Oropharynx is clear and moist. No oropharyngeal exudate.  Eyes: Conjunctivae and EOM are normal. Pupils are equal, round, and reactive to light.  Cardiovascular: Normal rate, regular rhythm, normal heart sounds and intact distal pulses.   No murmur heard. Pulmonary/Chest: Effort normal and breath sounds normal. No respiratory distress. He has no wheezes. He has no rales.  Musculoskeletal: He exhibits no edema.       Assessment & Plan:

## 2013-03-20 NOTE — Assessment & Plan Note (Signed)
Chronic, stable. Wonderful control today with transition from atenolol to lisinopril. Continue hctz 25mg  daily. Continue lisinopril 5mg  daily.

## 2013-03-20 NOTE — Assessment & Plan Note (Signed)
Recently went to diabetes education.  Changing diet. Anticipate persistent good control of diet controlled diabetes. Discussed let eye doctor know about new dx at upcoming appt.

## 2013-03-20 NOTE — Assessment & Plan Note (Signed)
rec start lipitor - has not picked up from pharmacy.

## 2013-03-20 NOTE — Patient Instructions (Signed)
I've sent in lisinopril 5mg  daily as well as refilled hydrochlorothiazide 25mg  daily. Start lipitor daily (for cholesterol). Let your eye doctor know about new diagnosis of diabetes. Good to see you today, call us with questions.  Return in 4 months for blood work and office visit.

## 2013-06-22 ENCOUNTER — Ambulatory Visit: Payer: Medicare Other | Admitting: Family Medicine

## 2013-07-15 ENCOUNTER — Other Ambulatory Visit: Payer: Self-pay | Admitting: Family Medicine

## 2013-07-15 DIAGNOSIS — E785 Hyperlipidemia, unspecified: Secondary | ICD-10-CM

## 2013-07-15 DIAGNOSIS — I10 Essential (primary) hypertension: Secondary | ICD-10-CM

## 2013-07-15 DIAGNOSIS — E119 Type 2 diabetes mellitus without complications: Secondary | ICD-10-CM

## 2013-07-17 ENCOUNTER — Other Ambulatory Visit: Payer: Medicare Other

## 2013-07-17 ENCOUNTER — Other Ambulatory Visit (INDEPENDENT_AMBULATORY_CARE_PROVIDER_SITE_OTHER): Payer: Medicare Other

## 2013-07-17 DIAGNOSIS — E785 Hyperlipidemia, unspecified: Secondary | ICD-10-CM

## 2013-07-17 DIAGNOSIS — I10 Essential (primary) hypertension: Secondary | ICD-10-CM

## 2013-07-17 DIAGNOSIS — E119 Type 2 diabetes mellitus without complications: Secondary | ICD-10-CM

## 2013-07-17 LAB — LIPID PANEL
Cholesterol: 209 mg/dL — ABNORMAL HIGH (ref 0–200)
HDL: 42.2 mg/dL (ref >39.00)
Total CHOL/HDL Ratio: 5
Triglycerides: 99 mg/dL (ref 0.0–149.0)
VLDL: 19.8 mg/dL (ref 0.0–40.0)

## 2013-07-17 LAB — COMPREHENSIVE METABOLIC PANEL
ALT: 34 U/L (ref 0–53)
AST: 30 U/L (ref 0–37)
Albumin: 4 g/dL (ref 3.5–5.2)
Alkaline Phosphatase: 95 U/L (ref 39–117)
BUN: 14 mg/dL (ref 6–23)
CHLORIDE: 104 meq/L (ref 96–112)
CO2: 29 meq/L (ref 19–32)
Calcium: 9.5 mg/dL (ref 8.4–10.5)
Creatinine, Ser: 1.2 mg/dL (ref 0.4–1.5)
GFR: 74.95 mL/min (ref >60.00)
Glucose, Bld: 107 mg/dL — ABNORMAL HIGH (ref 70–99)
Potassium: 3.8 mEq/L (ref 3.5–5.1)
SODIUM: 140 meq/L (ref 135–145)
TOTAL PROTEIN: 7.5 g/dL (ref 6.0–8.3)
Total Bilirubin: 0.5 mg/dL (ref 0.3–1.2)

## 2013-07-17 LAB — HEMOGLOBIN A1C: Hgb A1c MFr Bld: 6.3 % (ref 4.6–6.5)

## 2013-07-17 LAB — LDL CHOLESTEROL, DIRECT: Direct LDL: 151.9 mg/dL

## 2013-07-24 ENCOUNTER — Ambulatory Visit (INDEPENDENT_AMBULATORY_CARE_PROVIDER_SITE_OTHER): Payer: Medicare Other | Admitting: Family Medicine

## 2013-07-24 ENCOUNTER — Encounter: Payer: Self-pay | Admitting: Family Medicine

## 2013-07-24 VITALS — BP 128/66 | HR 72 | Temp 98.1°F | Wt 198.5 lb

## 2013-07-24 DIAGNOSIS — E119 Type 2 diabetes mellitus without complications: Secondary | ICD-10-CM

## 2013-07-24 DIAGNOSIS — E785 Hyperlipidemia, unspecified: Secondary | ICD-10-CM

## 2013-07-24 DIAGNOSIS — I10 Essential (primary) hypertension: Secondary | ICD-10-CM

## 2013-07-24 MED ORDER — ATORVASTATIN CALCIUM 20 MG PO TABS: 20.0000 mg | ORAL_TABLET | Freq: Every day | ORAL | Status: DC

## 2013-07-24 MED ORDER — LISINOPRIL 5 MG PO TABS: 5.0000 mg | ORAL_TABLET | Freq: Every day | ORAL | Status: DC

## 2013-07-24 MED ORDER — HYDROCHLOROTHIAZIDE 25 MG PO TABS: ORAL_TABLET | ORAL | Status: DC

## 2013-07-24 NOTE — Assessment & Plan Note (Signed)
Chronic, stable. Controlled on lisinopril and hctz.  Continue meds.

## 2013-07-24 NOTE — Assessment & Plan Note (Signed)
Wonderful control with diet changes.  Did complete DSME 02/2013.  Now prediabetic. rtc 6 mo for AMW, recheck at that time.

## 2013-07-24 NOTE — Patient Instructions (Signed)
I've refilled your lipitor (cholesterol) and blood pressure medicines Good job with sugar control! Keep up the good work.  You have controlled your sugars and are now back in Pre-diabetes range. Return in 6 months for next wellness exam.

## 2013-07-24 NOTE — Progress Notes (Signed)
BP 128/66  Pulse 72  Temp(Src) 98.1 F (36.7 C) (Oral)  Wt 198 lb 8 oz (90.039 kg)   CC: 4 mo f/u  Subjective:    Patient ID: Jeffrey Villarreal, male    DOB: 31-Oct-1940, 73 y.o.   MRN: [id number]  HPI: Jeffrey Villarreal is a 73 y.o. male presenting on 07/24/2013 with 4 month Follow-up  HTN - Compliant with current antihypertensive regimen of lisinopril 5mg  daily.  Ran out of hctz 25.  Atenolol is on his list but he states he is not taking atenolol.  Does not check blood pressures at home.  No low blood pressure symptoms of dizziness/syncope.  Denies HA, vision changes, CP/tightness, SOB, leg swelling.    DM - regularly does not check sugars.  Compliant with antihyperglycemic regimen which includes: diet changes.  Denies low sugars or hypoglycemic symptoms.  Denies paresthesias. Last diabetic eye exam ?.  Pneumovax: declines.     HLD - he thinks he did try lipitorbut has run out over last month.  Denies myalgias when he was taking med.  Some R lower back pain - improved with aleve.  Relevant past medical, surgical, family and social history reviewed and updated. Allergies and medications reviewed and updated. Current Outpatient Prescriptions on File Prior to Visit  Medication Sig  . aspirin EC 81 MG tablet Take 81 mg by mouth daily.   No current facility-administered medications on file prior to visit.    Review of Systems Per HPI unless specifically indicated above    Objective:    BP 128/66  Pulse 72  Temp(Src) 98.1 F (36.7 C) (Oral)  Wt 198 lb 8 oz (90.039 kg)  Physical Exam  Nursing note and vitals reviewed. Constitutional: He appears well-developed and well-nourished. No distress.  Neck: Carotid bruit is not present.  Cardiovascular: Normal rate, regular rhythm, normal heart sounds and intact distal pulses.   No murmur heard. Pulmonary/Chest: Effort normal and breath sounds normal. No respiratory distress. He has no wheezes. He has no rales.  Musculoskeletal: He  exhibits no edema.   Results for orders placed in visit on 07/17/13  HEMOGLOBIN A1C      Result Value Range   Hemoglobin A1C 6.3  4.6 - 6.5 %  LIPID PANEL      Result Value Range   Cholesterol 209 (*) 0 - 200 mg/dL   Triglycerides 40.9  0.0 - 149.0 mg/dL   HDL 81.19  >14.78 mg/dL   VLDL 29.5  0.0 - 62.1 mg/dL   Total CHOL/HDL Ratio 5    COMPREHENSIVE METABOLIC PANEL      Result Value Range   Sodium 140  135 - 145 mEq/L   Potassium 3.8  3.5 - 5.1 mEq/L   Chloride 104  96 - 112 mEq/L   CO2 29  19 - 32 mEq/L   Glucose, Bld 107 (*) 70 - 99 mg/dL   BUN 14  6 - 23 mg/dL   Creatinine, Ser 1.2  0.4 - 1.5 mg/dL   Total Bilirubin 0.5  0.3 - 1.2 mg/dL   Alkaline Phosphatase 95  39 - 117 U/L   AST 30  0 - 37 U/L   ALT 34  0 - 53 U/L   Total Protein 7.5  6.0 - 8.3 g/dL   Albumin 4.0  3.5 - 5.2 g/dL   Calcium 9.5  8.4 - 30.8 mg/dL   GFR 65.78  >46.96 mL/min  LDL CHOLESTEROL, DIRECT      Result Value  Range   Direct LDL 151.9        Assessment & Plan:   Problem List Items Addressed This Visit   Diabetes type 2, controlled     Wonderful control with diet changes.  Did complete DSME 02/2013.  Now prediabetic. rtc 6 mo for AMW, recheck at that time.    Relevant Medications      lisinopril (PRINIVIL,ZESTRIL) tablet      atorvastatin (LIPITOR) tablet   Hyperlipidemia     He did take lipitor for a few months but has been off this for last month. Recommended restart, ok to start at lower dose of 20mg  daily - and sent this into his pharmacy.    Relevant Medications      hydrochlorothiazide tablet   Hypertension - Primary (Chronic)     Chronic, stable. Controlled on lisinopril and hctz.  Continue meds.        Follow up plan: Return in about 6 months (around 01/21/2014), or as needed, for wellness exam, prior fasting for blood work.

## 2013-07-24 NOTE — Assessment & Plan Note (Signed)
He did take lipitor for a few months but has been off this for last month. Recommended restart, ok to start at lower dose of 20mg  daily - and sent this into his pharmacy.

## 2013-07-24 NOTE — Progress Notes (Signed)
Pre-visit discussion using our clinic review tool. No additional management support is needed unless otherwise documented below in the visit note.  

## 2013-07-25 ENCOUNTER — Telehealth: Payer: Self-pay | Admitting: Family Medicine

## 2013-07-25 NOTE — Telephone Encounter (Signed)
Relevant patient education mailed to patient.  

## 2013-12-13 ENCOUNTER — Ambulatory Visit (INDEPENDENT_AMBULATORY_CARE_PROVIDER_SITE_OTHER): Payer: Medicare Other | Admitting: Family Medicine

## 2013-12-13 ENCOUNTER — Encounter: Payer: Self-pay | Admitting: Family Medicine

## 2013-12-13 VITALS — BP 126/78 | HR 58 | Temp 98.0°F | Wt 195.5 lb

## 2013-12-13 DIAGNOSIS — K5900 Constipation, unspecified: Secondary | ICD-10-CM

## 2013-12-13 MED ORDER — MAGNESIUM HYDROXIDE 400 MG/5ML PO SUSP: 15.0000 mL | Freq: Every day | ORAL | Status: DC | PRN

## 2013-12-13 MED ORDER — POLYETHYLENE GLYCOL 3350 17 GM/SCOOP PO POWD: 17.0000 g | Freq: Every day | ORAL | Status: DC | PRN

## 2013-12-13 NOTE — Assessment & Plan Note (Signed)
No red flags. Treat with miralax 17gm in 8oz water daily prn constipation, hold for diarrhea. Discussed need to increase water with miralax. If not improved after a few days of this, may add on MOM. Update if not improving or if any worsening sxs. Pt agrees with plan.

## 2013-12-13 NOTE — Patient Instructions (Addendum)
Let's start miralax 1 capful daily for constipation (hold for diarrhea). Take this for next few weeks. If no improvement after a few days of miralax, may add on milk of magnesium (over the counter) which will cause diarrhea but also help with stool. If any worsening pain or not improving with this, please let me know.  Constipation Constipation is when a person has fewer than three bowel movements a week, has difficulty having a bowel movement, or has stools that are dry, hard, or larger than normal. As people grow older, constipation is more common. If you try to fix constipation with medicines that make you have a bowel movement (laxatives), the problem may get worse. Long-term laxative use may cause the muscles of the colon to become weak. A low-fiber diet, not taking in enough fluids, and taking certain medicines may make constipation worse.  CAUSES   Certain medicines, such as antidepressants, pain medicine, iron supplements, antacids, and water pills.   Certain diseases, such as diabetes, irritable bowel syndrome (IBS), thyroid disease, or depression.   Not drinking enough water.   Not eating enough fiber-rich foods.   Stress or travel.   Lack of physical activity or exercise.   Ignoring the urge to have a bowel movement.   Using laxatives too much.  SIGNS AND SYMPTOMS   Having fewer than three bowel movements a week.   Straining to have a bowel movement.   Having stools that are hard, dry, or larger than normal.   Feeling full or bloated.   Pain in the lower abdomen.   Not feeling relief after having a bowel movement.  DIAGNOSIS  Your health care provider will take a medical history and perform a physical exam. Further testing may be done for severe constipation. Some tests may include:  A barium enema X-ray to examine your rectum, colon, and, sometimes, your small intestine.   A sigmoidoscopy to examine your lower colon.   A colonoscopy to examine  your entire colon. TREATMENT  Treatment will depend on the severity of your constipation and what is causing it. Some dietary treatments include drinking more fluids and eating more fiber-rich foods. Lifestyle treatments may include regular exercise. If these diet and lifestyle recommendations do not help, your health care provider may recommend taking over-the-counter laxative medicines to help you have bowel movements. Prescription medicines may be prescribed if over-the-counter medicines do not work.  HOME CARE INSTRUCTIONS   Eat foods that have a lot of fiber, such as fruits, vegetables, whole grains, and beans.  Limit foods high in fat and processed sugars, such as french fries, hamburgers, cookies, candies, and soda.   A fiber supplement may be added to your diet if you cannot get enough fiber from foods.   Drink enough fluids to keep your urine clear or pale yellow.   Exercise regularly or as directed by your health care provider.   Go to the restroom when you have the urge to go. Do not hold it.   Only take over-the-counter or prescription medicines as directed by your health care provider. Do not take other medicines for constipation without talking to your health care provider first.  SEEK IMMEDIATE MEDICAL CARE IF:   You have bright red blood in your stool.   Your constipation lasts for more than 4 days or gets worse.   You have abdominal or rectal pain.   You have thin, pencil-like stools.   You have unexplained weight loss. MAKE SURE YOU:   Understand these  instructions.  Will watch your condition.  Will get help right away if you are not doing well or get worse. Document Released: 02/27/2004 Document Revised: 06/05/2013 Document Reviewed: 03/12/2013 [facility]Exit[facility] Patient Information 2015 [place]ExitCare, [place]. This information is not intended to replace advice given to you by your health care provider. Make sure you discuss any questions you have with your health  care provider.

## 2013-12-13 NOTE — Progress Notes (Signed)
Pre visit review using our clinic review tool, if applicable. No additional management support is needed unless otherwise documented below in the visit note. 

## 2013-12-13 NOTE — Progress Notes (Signed)
   BP 126/78  Pulse 58  Temp(Src) 98 F (36.7 C) (Oral)  Wt 195 lb 8 oz (88.678 kg)  SpO2 98%   CC: constipation Subjective:    Patient ID: Jeffrey Villarreal Foutz, male    DOB: 05-25-1941, 73 y.o.   MRN: [id number]  HPI: Jeffrey Villarreal Reek is a 73 y.o. male presenting on 12/13/2013 for Constipation and GI Problem   Change in bowel habits over last week - occasionally good BM but occasionally just small solid or liquid stool. Incomplete emptying feeling. No abd pain, occasional RUQ pressure/bloating relieved with belching. Drinking carbonated diet soda helps. Has increased fiber in diet. Good vegetable intake. So far has tried no meds for this. Normal flatus.  Prior to last week, regular with 1 soft stool a day.  No fevers/chills, nausea/vomiting, dysphagia, heartburn. No weight loss, no early satiety, no indigestion. Wt Readings from Last 3 Encounters:  12/13/13 195 lb 8 oz (88.678 kg)  07/24/13 198 lb 8 oz (90.039 kg)  03/20/13 197 lb 12 oz (89.699 kg)  Body mass index is 30.15 kg/(m^2).  COLONOSCOPY Date: 08/2009 large int hemorrhoids, benign lymphoid polyp, rpt due 10 yrs El[facility]([place])  Relevant past medical, surgical, family and social history reviewed and updated as indicated.  Allergies and medications reviewed and updated. Current Outpatient Prescriptions on File Prior to Visit  Medication Sig  . aspirin EC 81 MG tablet Take 81 mg by mouth daily.  Marland Kitchen. atorvastatin (LIPITOR) 20 MG tablet Take 1 tablet (20 mg total) by mouth daily.  . hydrochlorothiazide (HYDRODIURIL) 25 MG tablet TAKE ONE TABLET BY MOUTH EVERY DAY  . lisinopril (PRINIVIL,ZESTRIL) 5 MG tablet Take 1 tablet (5 mg total) by mouth daily.   No current facility-administered medications on file prior to visit.    Review of Systems Per HPI unless specifically indicated above    Objective:    BP 126/78  Pulse 58  Temp(Src) 98 F (36.7 C) (Oral)  Wt 195 lb 8 oz (88.678 kg)  SpO2 98%  Physical Exam  Nursing note and vitals  reviewed. Constitutional: He appears well-developed and well-nourished. No distress.  HENT:  Mouth/Throat: Oropharynx is clear and moist. No oropharyngeal exudate.  Cardiovascular: Normal rate, regular rhythm, normal heart sounds and intact distal pulses.   No murmur heard. Pulmonary/Chest: Effort normal and breath sounds normal. No respiratory distress. He has no wheezes. He has no rales.  Abdominal: Soft. Normal appearance. He exhibits distension (mild). He exhibits no mass. Bowel sounds are increased. There is no hepatosplenomegaly. There is generalized tenderness (mild). There is no rigidity, no rebound, no guarding, no CVA tenderness and negative Murphy's sign. A hernia (umbilical) is present.       Assessment & Plan:   Problem List Items Addressed This Visit   Constipation - Primary     No red flags. Treat with miralax 17gm in 8oz water daily prn constipation, hold for diarrhea. Discussed need to increase water with miralax. If not improved after a few days of this, may add on MOM. Update if not improving or if any worsening sxs. Pt agrees with plan.        Follow up plan: Return if symptoms worsen or fail to improve, for follow up visit.

## 2014-01-13 ENCOUNTER — Other Ambulatory Visit: Payer: Self-pay | Admitting: Family Medicine

## 2014-01-13 DIAGNOSIS — I10 Essential (primary) hypertension: Secondary | ICD-10-CM

## 2014-01-13 DIAGNOSIS — E119 Type 2 diabetes mellitus without complications: Secondary | ICD-10-CM

## 2014-01-13 DIAGNOSIS — E785 Hyperlipidemia, unspecified: Secondary | ICD-10-CM

## 2014-01-18 ENCOUNTER — Other Ambulatory Visit: Payer: Medicare Other

## 2014-01-21 ENCOUNTER — Other Ambulatory Visit (INDEPENDENT_AMBULATORY_CARE_PROVIDER_SITE_OTHER): Payer: Medicare Other

## 2014-01-21 DIAGNOSIS — E119 Type 2 diabetes mellitus without complications: Secondary | ICD-10-CM

## 2014-01-21 DIAGNOSIS — E785 Hyperlipidemia, unspecified: Secondary | ICD-10-CM

## 2014-01-21 DIAGNOSIS — I10 Essential (primary) hypertension: Secondary | ICD-10-CM

## 2014-01-21 LAB — BASIC METABOLIC PANEL
BUN: 15 mg/dL (ref 6–23)
CO2: 30 meq/L (ref 19–32)
Calcium: 9.3 mg/dL (ref 8.4–10.5)
Chloride: 97 mEq/L (ref 96–112)
Creatinine, Ser: 1.3 mg/dL (ref 0.4–1.5)
GFR: 68.94 mL/min (ref >60.00)
GLUCOSE: 92 mg/dL (ref 70–99)
Potassium: 3.9 mEq/L (ref 3.5–5.1)
SODIUM: 136 meq/L (ref 135–145)

## 2014-01-21 LAB — LDL CHOLESTEROL, DIRECT: LDL DIRECT: 165.9 mg/dL

## 2014-01-21 LAB — HEMOGLOBIN A1C: Hgb A1c MFr Bld: 6.3 % (ref 4.6–6.5)

## 2014-01-22 ENCOUNTER — Telehealth: Payer: Self-pay | Admitting: Family Medicine

## 2014-01-22 NOTE — Telephone Encounter (Signed)
Pt says someone called stating he has high cholesterol and asked about a medication, but he doesn't know who it was. Could you please call him back? Thank you.

## 2014-01-22 NOTE — Telephone Encounter (Signed)
Spoke with patient.

## 2014-01-25 ENCOUNTER — Encounter: Payer: Medicare Other | Admitting: Family Medicine

## 2014-02-12 ENCOUNTER — Encounter: Payer: Medicare Other | Admitting: Family Medicine

## 2014-02-15 ENCOUNTER — Ambulatory Visit (INDEPENDENT_AMBULATORY_CARE_PROVIDER_SITE_OTHER): Payer: Medicare Other | Admitting: Family Medicine

## 2014-02-15 ENCOUNTER — Encounter: Payer: Self-pay | Admitting: Family Medicine

## 2014-02-15 VITALS — BP 136/82 | HR 64 | Temp 98.1°F | Ht 66.0 in | Wt 200.2 lb

## 2014-02-15 DIAGNOSIS — R7303 Prediabetes: Secondary | ICD-10-CM

## 2014-02-15 DIAGNOSIS — E785 Hyperlipidemia, unspecified: Secondary | ICD-10-CM

## 2014-02-15 DIAGNOSIS — H5462 Unqualified visual loss, left eye, normal vision right eye: Secondary | ICD-10-CM | POA: Insufficient documentation

## 2014-02-15 DIAGNOSIS — I10 Essential (primary) hypertension: Secondary | ICD-10-CM

## 2014-02-15 DIAGNOSIS — Z7189 Other specified counseling: Secondary | ICD-10-CM

## 2014-02-15 DIAGNOSIS — Z0000 Encounter for general adult medical examination without abnormal findings: Secondary | ICD-10-CM

## 2014-02-15 DIAGNOSIS — H9193 Unspecified hearing loss, bilateral: Secondary | ICD-10-CM

## 2014-02-15 MED ORDER — ATORVASTATIN CALCIUM 40 MG PO TABS: 40.0000 mg | ORAL_TABLET | Freq: Every day | ORAL | Status: DC

## 2014-02-15 NOTE — Progress Notes (Signed)
BP 136/82  Pulse 64  Temp(Src) 98.1 F (36.7 C) (Oral)  Ht  (1.676 m)  Wt 200 lb 4 oz (90.833 kg)  BMI 32.34 kg/m2   CC: medicare wellness Subjective:    Patient ID: Jeffrey Villarreal, male    DOB: [birth year], 73 y.o.   MRN: [id number]  HPI: Jeffrey Villarreal is a 73 y.o. male presenting on 02/15/2014 for Annual Exam   Wt Readings from Last 3 Encounters:  02/15/14 200 lb 4 oz (90.833 kg)  12/13/13 195 lb 8 oz (88.678 kg)  07/24/13 198 lb 8 oz (90.039 kg)   Body mass index is 32.34 kg/(m^2).  Hearing - failed today. H/o cerumen impaction, will evaluate today. Occasional problems with hearing.  Vision screen - failed left eye. S/p R cataract extraction. Pending L cataract surgery ([facility]). Due to f/u with [facility]. No falls in last year.  Denies depression, anhedonia.   Seat belt use 100%.   Preventative: Colonoscopy 08/2009, large int hemorrhoids, benign lymphoid polyp, rpt due 10 yrs Elnoria Howard)  Prostate - screening done at [facility] urology, has appt scheduled. No h/o prostate problems, no family hx prostate cancer. Nocturia x1. Regular stream.  Tetanus - unsure, thinks ~2009.  zostavax - never received. Not currently interested  Pneumovax - never received. Discussed reasons to immunize. Currently declines.  Never taken flu shot.  Advanced directives - no directive. Would want wife to be HCproxy.   Caffeine: 1 cup coffee.  Lives with wife, step-daughter (29yo), no pets  Occupation: retired  Activity: walks daily  Diet: good water, fruits/vegetables daily   Relevant past medical, surgical, family and social history reviewed and updated as indicated.  Allergies and medications reviewed and updated. Current Outpatient Prescriptions on File Prior to Visit  Medication Sig  . aspirin EC 81 MG tablet Take 81 mg by mouth daily.  . hydrochlorothiazide (HYDRODIURIL) 25 MG tablet TAKE ONE TABLET BY MOUTH EVERY DAY  . lisinopril (PRINIVIL,ZESTRIL) 5 MG tablet Take 1 tablet (5 mg total)  by mouth daily.  . magnesium hydroxide (MILK OF MAGNESIA) 400 MG/5ML suspension Take 15 mLs by mouth daily as needed for moderate constipation.  . polyethylene glycol powder (GLYCOLAX/MIRALAX) powder Take 17 g by mouth daily as needed for moderate constipation.   No current facility-administered medications on file prior to visit.    Review of Systems  Constitutional: Negative for fever, chills, activity change, appetite change, fatigue and unexpected weight change.  HENT: Negative for hearing loss.   Eyes: Negative for visual disturbance.  Respiratory: Positive for cough (minimal cough, not bothersome). Negative for chest tightness, shortness of breath and wheezing.   Cardiovascular: Negative for chest pain, palpitations and leg swelling.  Gastrointestinal: Negative for nausea, vomiting, abdominal pain, diarrhea, constipation, blood in stool and abdominal distention.  Genitourinary: Negative for hematuria and difficulty urinating.  Musculoskeletal: Negative for arthralgias, myalgias and neck pain.  Skin: Negative for rash.  Neurological: Negative for dizziness, seizures, syncope and headaches.  Hematological: Negative for adenopathy. Does not bruise/bleed easily.  Psychiatric/Behavioral: Negative for dysphoric mood. The patient is not nervous/anxious.    Per HPI unless specifically indicated above    Objective:    BP 136/82  Pulse 64  Temp(Src) 98.1 F (36.7 C) (Oral)  Ht  (1.676 m)  Wt 200 lb 4 oz (90.833 kg)  BMI 32.34 kg/m2  Physical Exam  Nursing note and vitals reviewed. Constitutional: He is oriented to person, place, and time. He appears well-developed and well-nourished. No  distress.  HENT:  Head: Normocephalic and atraumatic.  Right Ear: Hearing, tympanic membrane, external ear and ear canal normal.  Left Ear: Hearing, tympanic membrane, external ear and ear canal normal.  Nose: Nose normal.  Mouth/Throat: Uvula is midline, oropharynx is clear and moist and  mucous membranes are normal. No oropharyngeal exudate, posterior oropharyngeal edema or posterior oropharyngeal erythema.  Eyes: Conjunctivae and EOM are normal. Pupils are equal, round, and reactive to light. No scleral icterus.  Neck: Normal range of motion. Neck supple. Carotid bruit is not present. No thyromegaly present.  Cardiovascular: Normal rate, regular rhythm, normal heart sounds and intact distal pulses.   No murmur heard. Pulses:      Radial pulses are 2+ on the right side, and 2+ on the left side.  Pulmonary/Chest: Effort normal and breath sounds normal. No respiratory distress. He has no wheezes. He has no rales.  Abdominal: Soft. Bowel sounds are normal. He exhibits no distension and no mass. There is no tenderness. There is no rebound and no guarding.  Genitourinary:  Deferred - per urology  Musculoskeletal: Normal range of motion. He exhibits no edema.  Lymphadenopathy:    He has no cervical adenopathy.  Neurological: He is alert and oriented to person, place, and time.  CN grossly intact, station and gait intact Recall 3/3 Calculation 5/5 serial 3s  Skin: Skin is warm and dry. No rash noted.  Psychiatric: He has a normal mood and affect. His behavior is normal. Judgment and thought content normal.   Results for orders placed in visit on 01/21/14  BASIC METABOLIC PANEL      Result Value Ref Range   Sodium 136  135 - 145 mEq/L   Potassium 3.9  3.5 - 5.1 mEq/L   Chloride 97  96 - 112 mEq/L   CO2 30  19 - 32 mEq/L   Glucose, Bld 92  70 - 99 mg/dL   BUN 15  6 - 23 mg/dL   Creatinine, Ser 1.3  0.4 - 1.5 mg/dL   Calcium 9.3  8.4 - 16.1 mg/dL   GFR 09.60  >45.40 mL/min  HEMOGLOBIN A1C      Result Value Ref Range   Hemoglobin A1C 6.3  4.6 - 6.5 %  LDL CHOLESTEROL, DIRECT      Result Value Ref Range   Direct LDL 165.9        Assessment & Plan:   Problem List Items Addressed This Visit   Vision loss, left eye     In known cataract - pt will schedule appt with Dr.  Dione Booze.    Prediabetes     Wonderful control - actually prediabetic now with diet changes - will change diagnosis.    Medicare annual wellness visit, subsequent - Primary     I have personally reviewed the Medicare Annual Wellness questionnaire and have noted 1. The patient's medical and social history 2. Their use of alcohol, tobacco or illicit drugs 3. Their current medications and supplements 4. The patient's functional ability including ADL's, fall risks, home safety risks and hearing or visual impairment. 5. Diet and physical activity 6. Evidence for depression or mood disorders The patients weight, height, BMI have been recorded in the chart.  Hearing and vision has been addressed. I have made referrals, counseling and provided education to the patient based review of the above and I have provided the pt with a written personalized care plan for preventive services. Provider list updated - see scanned questionairre. Advanced directives -  no HCPOA. Would want wife to be HCproxy. Provided with advanced directive handout.   Reviewed preventative protocols and updated unless pt declined.    Hypertension (Chronic)     Chronic, stable. Continue lisinopril and hctz    Relevant Medications      atorvastatin (LIPITOR) tablet   Hyperlipidemia     Uncontrolled - increase lipitor to  daily, rec take at night time.    Relevant Medications      atorvastatin (LIPITOR) tablet   Health maintenance examination     Preventative protocols reviewed and updated unless pt declined. Discussed healthy diet and lifestyle.  Declines all immunizations.    Decreased hearing of both ears     Bilateral irrigation performed today.    Advanced care planning/counseling discussion     Advanced directives - no directive. Would want wife to be HCproxy.         Follow up plan: Return in about 6 months (around 08/16/2014), or as needed, for follow up visit.

## 2014-02-15 NOTE — Assessment & Plan Note (Signed)
Chronic, stable. Continue lisinopril and hctz

## 2014-02-15 NOTE — Assessment & Plan Note (Signed)
Bilateral irrigation performed today.

## 2014-02-15 NOTE — Assessment & Plan Note (Signed)
Wonderful control - actually prediabetic now with diet changes - will change diagnosis.

## 2014-02-15 NOTE — Progress Notes (Signed)
Pre visit review using our clinic review tool, if applicable. No additional management support is needed unless otherwise documented below in the visit note. 

## 2014-02-15 NOTE — Assessment & Plan Note (Signed)
In known cataract - pt will schedule appt with Dr. Dione Booze.

## 2014-02-15 NOTE — Assessment & Plan Note (Signed)
I have personally reviewed the Medicare Annual Wellness questionnaire and have noted 1. The patient's medical and social history 2. Their use of alcohol, tobacco or illicit drugs 3. Their current medications and supplements 4. The patient's functional ability including ADL's, fall risks, home safety risks and hearing or visual impairment. 5. Diet and physical activity 6. Evidence for depression or mood disorders The patients weight, height, BMI have been recorded in the chart.  Hearing and vision has been addressed. I have made referrals, counseling and provided education to the patient based review of the above and I have provided the pt with a written personalized care plan for preventive services. Provider list updated - see scanned questionairre. Advanced directives - no HCPOA. Would want wife to be HCproxy. Provided with advanced directive handout.   Reviewed preventative protocols and updated unless pt declined.

## 2014-02-15 NOTE — Assessment & Plan Note (Signed)
Uncontrolled - increase lipitor to  daily, rec take at night time.

## 2014-02-15 NOTE — Assessment & Plan Note (Signed)
Advanced directives - no directive. Would want wife to be HCproxy.

## 2014-02-15 NOTE — Patient Instructions (Addendum)
Call to schedule Dr. Dione Booze. Think about flu and pneumonia shots. Let's increase lipitor to  - try to take 1 tablet every night if you can remember. Good to see you today, call us with quesitons. Return in 6 months for follow up visit or sooner if needed.

## 2014-02-15 NOTE — Assessment & Plan Note (Addendum)
Preventative protocols reviewed and updated unless pt declined. Discussed healthy diet and lifestyle.  Declines all immunizations. 

## 2014-03-21 ENCOUNTER — Telehealth: Payer: Self-pay

## 2014-03-21 NOTE — Telephone Encounter (Signed)
Left a message for call back.  Called patient regarding diabetic eye exam.  When patient calls back please ask:  Have you had a recent (2014-2015) eye exam?    Date of Exam?  Where?    

## 2014-03-27 ENCOUNTER — Other Ambulatory Visit: Payer: Self-pay | Admitting: Family Medicine

## 2014-03-28 ENCOUNTER — Encounter (HOSPITAL_COMMUNITY): Payer: Self-pay | Admitting: Emergency Medicine

## 2014-03-28 ENCOUNTER — Emergency Department (HOSPITAL_COMMUNITY)
Admission: EM | Admit: 2014-03-28 | Discharge: 2014-03-29 | Disposition: A | Discharge status: Home / Self Care | Payer: Medicare Other | Attending: Emergency Medicine | Admitting: Emergency Medicine

## 2014-03-28 DIAGNOSIS — Z7982 Long term (current) use of aspirin: Secondary | ICD-10-CM | POA: Insufficient documentation

## 2014-03-28 DIAGNOSIS — Y9389 Activity, other specified: Secondary | ICD-10-CM | POA: Diagnosis not present

## 2014-03-28 DIAGNOSIS — E119 Type 2 diabetes mellitus without complications: Secondary | ICD-10-CM | POA: Insufficient documentation

## 2014-03-28 DIAGNOSIS — S299XXA Unspecified injury of thorax, initial encounter: Secondary | ICD-10-CM | POA: Diagnosis present

## 2014-03-28 DIAGNOSIS — E785 Hyperlipidemia, unspecified: Secondary | ICD-10-CM | POA: Diagnosis not present

## 2014-03-28 DIAGNOSIS — Z79899 Other long term (current) drug therapy: Secondary | ICD-10-CM | POA: Insufficient documentation

## 2014-03-28 DIAGNOSIS — I10 Essential (primary) hypertension: Secondary | ICD-10-CM | POA: Insufficient documentation

## 2014-03-28 DIAGNOSIS — Z87891 Personal history of nicotine dependence: Secondary | ICD-10-CM | POA: Insufficient documentation

## 2014-03-28 DIAGNOSIS — Y92410 Unspecified street and highway as the place of occurrence of the external cause: Secondary | ICD-10-CM | POA: Diagnosis not present
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
## 2014-03-29 ENCOUNTER — Emergency Department (HOSPITAL_COMMUNITY): Payer: Medicare Other

## 2014-03-29 MED ORDER — TRAMADOL HCL 50 MG PO TABS: 50.0000 mg | ORAL_TABLET | Freq: Four times a day (QID) | ORAL | Status: DC | PRN
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
## 2014-04-18 NOTE — Telephone Encounter (Signed)
Left a message for call back.  

## 2014-05-13 NOTE — Telephone Encounter (Signed)
No call back from patient.  Encounter closed.   

## 2014-05-20 ENCOUNTER — Other Ambulatory Visit: Payer: Self-pay | Admitting: Oncology

## 2014-06-01 ENCOUNTER — Telehealth: Payer: Self-pay

## 2014-06-01 NOTE — Telephone Encounter (Signed)
Spoke to pt and pt stated that he has an appt for his eye exam on 07/02/14.   [address] D[address].

## 2014-06-14 HISTORY — PX: DOBUTAMINE STRESS ECHO: SHX5426

## 2014-06-27 ENCOUNTER — Encounter: Payer: Self-pay | Admitting: Family Medicine

## 2014-06-27 ENCOUNTER — Ambulatory Visit (INDEPENDENT_AMBULATORY_CARE_PROVIDER_SITE_OTHER): Payer: Medicare Other | Admitting: Family Medicine

## 2014-06-27 VITALS — BP 130/70 | HR 64 | Temp 98.3°F | Wt 201.0 lb

## 2014-06-27 DIAGNOSIS — B9789 Other viral agents as the cause of diseases classified elsewhere: Principal | ICD-10-CM

## 2014-06-27 DIAGNOSIS — J069 Acute upper respiratory infection, unspecified: Secondary | ICD-10-CM

## 2014-06-27 MED ORDER — BENZONATATE 100 MG PO CAPS: 100.0000 mg | ORAL_CAPSULE | Freq: Three times a day (TID) | ORAL | Status: DC | PRN

## 2014-06-27 MED ORDER — AZITHROMYCIN 250 MG PO TABS: ORAL_TABLET | ORAL | Status: DC

## 2014-06-27 NOTE — Assessment & Plan Note (Signed)
Anticipate viral given duration and improving. Treat with supportive care as per instructions and tessalon perls. WASP for zpack provided today - with indications of when to fill. Pt agrees with plan.

## 2014-06-27 NOTE — Patient Instructions (Signed)
Sounds like you have a viral chest cold or possible early bronchitis, still viral. Antibiotics are not needed for this.  Viral infections usually take 7-10 days to resolve.  The cough can last a few weeks to go away. Use medication as prescribed: tessalon perls for cough. Fill zpack antibiotic (provided today) if not improving as expected or any worsening Push fluids and plenty of rest. Please return if you are not improving as expected, or if you have high fevers (>101.5) or difficulty swallowing or worsening productive cough. Call clinic with questions.  Good to see you today.

## 2014-06-27 NOTE — Progress Notes (Signed)
BP 130/70 mmHg  Pulse 64  Temp(Src) 98.3 F (36.8 C) (Oral)  Wt 201 lb (91.173 kg)  SpO2 96%   CC: chest cold  Subjective:    Patient ID: Jeffrey Villarreal, male    DOB: December 30, 1940, 74 y.o.   MRN: [id number]  HPI: Jeffrey Villarreal is a 74 y.o. male presenting on 06/27/2014 for URI   10d h/o chest cold, seems to be improving. Worse cough at night time, sinus congestion < chest congestion.   No fevers/chills, ear or tooth pain, dyspnea or wheezing, ST or PNdrainage, or headache.   Has tried OTC remedies of alka seltzer plus and OTC robitussin without improvement. This caused dizziness.  Patient has diet controlled diabetes. No sick contacts at home. No smokers at home.  Relevant past medical, surgical, family and social history reviewed and updated as indicated. Interim medical history since our last visit reviewed. Allergies and medications reviewed and updated. Current Outpatient Prescriptions on File Prior to Visit  Medication Sig  . aspirin EC 81 MG tablet Take 81 mg by mouth daily.  Marland Kitchen. atorvastatin (LIPITOR) 40 MG tablet Take 40 mg by mouth daily.  . hydrochlorothiazide (HYDRODIURIL) 25 MG tablet Take 25 mg by mouth daily.  Marland Kitchen. lisinopril (PRINIVIL,ZESTRIL) 5 MG tablet Take 5 mg by mouth daily.   No current facility-administered medications on file prior to visit.    Review of Systems Per HPI unless specifically indicated above     Objective:    BP 130/70 mmHg  Pulse 64  Temp(Src) 98.3 F (36.8 C) (Oral)  Wt 201 lb (91.173 kg)  SpO2 96%  Wt Readings from Last 3 Encounters:  06/27/14 201 lb (91.173 kg)  02/15/14 200 lb 4 oz (90.833 kg)  12/13/13 195 lb 8 oz (88.678 kg)    Physical Exam  Constitutional: He appears well-developed and well-nourished. No distress.  HENT:  Head: Normocephalic and atraumatic.  Right Ear: Hearing, tympanic membrane, external ear and ear canal normal.  Left Ear: Hearing, tympanic membrane, external ear and ear canal normal.  Nose:  Mucosal edema (mild) present. No rhinorrhea. Right sinus exhibits no maxillary sinus tenderness and no frontal sinus tenderness. Left sinus exhibits no maxillary sinus tenderness and no frontal sinus tenderness.  Mouth/Throat: Uvula is midline and mucous membranes are normal. Posterior oropharyngeal edema and posterior oropharyngeal erythema present. No oropharyngeal exudate or tonsillar abscesses.  Eyes: Conjunctivae and EOM are normal. Pupils are equal, round, and reactive to light. No scleral icterus.  Neck: Normal range of motion. Neck supple.  Cardiovascular: Normal rate, regular rhythm, normal heart sounds and intact distal pulses.   No murmur heard. Pulmonary/Chest: Effort normal and breath sounds normal. No respiratory distress. He has no wheezes. He has no rales.  Lymphadenopathy:    He has no cervical adenopathy.  Skin: Skin is warm and dry. No rash noted.  Nursing note and vitals reviewed.  Results for orders placed or performed in visit on 01/21/14  Basic metabolic panel  Result Value Ref Range   Sodium 136 135 - 145 mEq/L   Potassium 3.9 3.5 - 5.1 mEq/L   Chloride 97 96 - 112 mEq/L   CO2 30 19 - 32 mEq/L   Glucose, Bld 92 70 - 99 mg/dL   BUN 15 6 - 23 mg/dL   Creatinine, Ser 1.3 0.4 - 1.5 mg/dL   Calcium 9.3 8.4 - 40.910.5 mg/dL   GFR 81.1968.94 >14.78>60.00 mL/min  Hemoglobin A1c  Result Value Ref Range   Hgb  A1c MFr Bld 6.3 4.6 - 6.5 %  LDL Cholesterol, Direct  Result Value Ref Range   Direct LDL 165.9 mg/dL      Assessment & Plan:   Problem List Items Addressed This Visit    Viral URI with cough - Primary    Anticipate viral given duration and improving. Treat with supportive care as per instructions and tessalon perls. WASP for zpack provided today - with indications of when to fill. Pt agrees with plan.      Relevant Medications   azithromycin (ZITHROMAX) tablet       Follow up plan: Return if symptoms worsen or fail to improve.

## 2014-06-27 NOTE — Progress Notes (Signed)
Pre visit review using our clinic review tool, if applicable. No additional management support is needed unless otherwise documented below in the visit note. 

## 2014-07-02 DIAGNOSIS — Z961 Presence of intraocular lens: Secondary | ICD-10-CM | POA: Diagnosis not present

## 2014-07-02 DIAGNOSIS — H2512 Age-related nuclear cataract, left eye: Secondary | ICD-10-CM | POA: Diagnosis not present

## 2014-08-11 ENCOUNTER — Other Ambulatory Visit: Payer: Self-pay | Admitting: Family Medicine

## 2014-08-13 DIAGNOSIS — I6529 Occlusion and stenosis of unspecified carotid artery: Secondary | ICD-10-CM | POA: Insufficient documentation

## 2014-08-13 HISTORY — DX: Occlusion and stenosis of unspecified carotid artery: I65.29

## 2014-08-16 ENCOUNTER — Ambulatory Visit (INDEPENDENT_AMBULATORY_CARE_PROVIDER_SITE_OTHER): Payer: Medicare Other | Admitting: Family Medicine

## 2014-08-16 ENCOUNTER — Encounter: Payer: Self-pay | Admitting: Family Medicine

## 2014-08-16 VITALS — BP 110/66 | HR 72 | Temp 98.0°F | Wt 203.5 lb

## 2014-08-16 DIAGNOSIS — R7309 Other abnormal glucose: Secondary | ICD-10-CM | POA: Diagnosis not present

## 2014-08-16 DIAGNOSIS — E669 Obesity, unspecified: Secondary | ICD-10-CM

## 2014-08-16 DIAGNOSIS — R7303 Prediabetes: Secondary | ICD-10-CM

## 2014-08-16 DIAGNOSIS — E785 Hyperlipidemia, unspecified: Secondary | ICD-10-CM

## 2014-08-16 DIAGNOSIS — I10 Essential (primary) hypertension: Secondary | ICD-10-CM | POA: Diagnosis not present

## 2014-08-16 MED ORDER — HYDROCHLOROTHIAZIDE 12.5 MG PO TABS: 12.5000 mg | ORAL_TABLET | Freq: Every day | ORAL | Status: DC

## 2014-08-16 MED ORDER — ATORVASTATIN CALCIUM 40 MG PO TABS: 40.0000 mg | ORAL_TABLET | Freq: Every day | ORAL | Status: DC

## 2014-08-16 NOTE — Progress Notes (Signed)
Pre visit review using our clinic review tool, if applicable. No additional management support is needed unless otherwise documented below in the visit note. 

## 2014-08-16 NOTE — Assessment & Plan Note (Signed)
Body mass index is 32.86 kg/(m^2). will discuss at next viist.

## 2014-08-16 NOTE — Assessment & Plan Note (Signed)
Chronic, stable. Continue lisinopril. Chronically well controlled. Decrease hctz to 12.5mg  daily and pt will monitor bp at home to ensure doesn't start trending upwards.

## 2014-08-16 NOTE — Progress Notes (Signed)
BP 110/66 mmHg  Pulse 72  Temp(Src) 98 F (36.7 C) (Oral)  Wt 203 lb 8 oz (92.307 kg)   CC: 30mo f/u visit Subjective:    Patient ID: Jeffrey Villarreal, male    DOB: 09/03/1940, 74 y.o.   MRN: [id number]  HPI: Jeffrey Villarreal is a 74 y.o. male presenting on 08/16/2014 for Follow-up   Prediabetes - last visit with healthy diet and lifestyle changes pt was able to change dx from diet controlled diabetes to prediabetes.  Lab Results  Component Value Date   HGBA1C 6.3 01/21/2014    HLD - last visit, chol levels returned elevated so we increased lipitor from  to  daily. Compliant without myalgias Lab Results  Component Value Date   CHOL 209* 07/17/2013   HDL 42.20 07/17/2013   LDLDIRECT 165.9 01/21/2014   TRIG 99.0 07/17/2013   CHOLHDL 5 07/17/2013   HTN - Compliant with current antihypertensive regimen of lisinopril  daily and hctz  daily.  Does not regularly check blood pressures at home: doesn't know readings.  No low blood pressure readings or symptoms of dizziness/syncope.  Denies HA, vision changes, CP/tightness, SOB, leg swelling.  BP Readings from Last 3 Encounters:  08/16/14 110/66  06/27/14 130/70  03/29/14 115/61   Relevant past medical, surgical, family and social history reviewed and updated as indicated. Interim medical history since our last visit reviewed. Allergies and medications reviewed and updated. Current Outpatient Prescriptions on File Prior to Visit  Medication Sig  . aspirin EC 81 MG tablet Take 81 mg by mouth daily.  Marland Kitchen lisinopril (PRINIVIL,ZESTRIL) 5 MG tablet TAKE ONE TABLET BY MOUTH ONCE DAILY   No current facility-administered medications on file prior to visit.    Review of Systems Per HPI unless specifically indicated above     Objective:    BP 110/66 mmHg  Pulse 72  Temp(Src) 98 F (36.7 C) (Oral)  Wt 203 lb 8 oz (92.307 kg)  Wt Readings from Last 3 Encounters:  08/16/14 203 lb 8 oz (92.307 kg)  06/27/14 201 lb (91.173  kg)  02/15/14 200 lb 4 oz (90.833 kg)   Body mass index is 32.86 kg/(m^2).  Physical Exam  Constitutional: He appears well-developed and well-nourished. No distress.  HENT:  Head: Normocephalic and atraumatic.  Mouth/Throat: Oropharynx is clear and moist. No oropharyngeal exudate.  Cardiovascular: Normal rate, regular rhythm, normal heart sounds and intact distal pulses.   No murmur heard. Pulmonary/Chest: Effort normal and breath sounds normal. No respiratory distress. He has no wheezes. He has no rales.  Musculoskeletal: He exhibits no edema.  Skin: Skin is warm and dry. No rash noted.  Psychiatric: He has a normal mood and affect.  Nursing note and vitals reviewed.      Assessment & Plan:   Problem List Items Addressed This Visit    Prediabetes    Great control - prediabetes. Continue monitoring diet. Check A1c today.      Relevant Orders   Hemoglobin A1c   Obesity    Body mass index is 32.86 kg/(m^2). will discuss at next viist.      Hypertension - Primary (Chronic)    Chronic, stable. Continue lisinopril. Chronically well controlled. Decrease hctz to 12.5mg  daily and pt will monitor bp at home to ensure doesn't start trending upwards.       Relevant Medications   atorvastatin (LIPITOR) tablet   hydrochlorothiazide (HYDRODIURIL) tablet   Other Relevant Orders   Basic metabolic panel   Microalbumin /  creatinine urine ratio   Hyperlipidemia    Tolerating lipitor 40mg  daily generic. Check FLP today.      Relevant Medications   atorvastatin (LIPITOR) tablet   hydrochlorothiazide (HYDRODIURIL) tablet   Other Relevant Orders   Lipid panel       Follow up plan: Return in about 6 months (around 02/16/2015), or if symptoms worsen or fail to improve, for medicare wellness.

## 2014-08-16 NOTE — Patient Instructions (Addendum)
Blood work today. Cut hydrochlorothiazide pills in half and new dose of 12.5mg  will be at pharmacy for you for next refill. Continue lisinopril, atorvastatin and aspirin. Return in 6 months for medicare wellness visit

## 2014-08-16 NOTE — Assessment & Plan Note (Signed)
Tolerating lipitor 40mg  daily generic. Check FLP today.

## 2014-08-16 NOTE — Assessment & Plan Note (Signed)
Great control - prediabetes. Continue monitoring diet. Check A1c today.

## 2014-08-17 LAB — BASIC METABOLIC PANEL
BUN: 16 mg/dL (ref 6–23)
CALCIUM: 9.6 mg/dL (ref 8.4–10.5)
CO2: 30 meq/L (ref 19–32)
Chloride: 100 mEq/L (ref 96–112)
Creat: 1.19 mg/dL (ref 0.50–1.35)
Glucose, Bld: 89 mg/dL (ref 70–99)
Potassium: 3.8 mEq/L (ref 3.5–5.3)
Sodium: 138 mEq/L (ref 135–145)

## 2014-08-17 LAB — LIPID PANEL
Cholesterol: 172 mg/dL (ref 0–200)
HDL: 45 mg/dL (ref >=40)
LDL Cholesterol: 106 mg/dL — ABNORMAL HIGH (ref 0–99)
TRIGLYCERIDES: 105 mg/dL (ref <150)
Total CHOL/HDL Ratio: 3.8 Ratio
VLDL: 21 mg/dL (ref 0–40)

## 2014-08-17 LAB — MICROALBUMIN / CREATININE URINE RATIO
Creatinine, Urine: 210.1 mg/dL
MICROALB/CREAT RATIO: 3.8 mg/g (ref 0.0–30.0)
Microalb, Ur: 0.8 mg/dL (ref <2.0)

## 2014-08-17 LAB — HEMOGLOBIN A1C
HEMOGLOBIN A1C: 6.4 % — AB (ref <5.7)
Mean Plasma Glucose: 137 mg/dL — ABNORMAL HIGH (ref <117)

## 2014-08-19 ENCOUNTER — Ambulatory Visit (INDEPENDENT_AMBULATORY_CARE_PROVIDER_SITE_OTHER): Payer: Medicare Other | Admitting: Family Medicine

## 2014-08-19 ENCOUNTER — Telehealth: Payer: Self-pay | Admitting: Family Medicine

## 2014-08-19 ENCOUNTER — Encounter: Payer: Self-pay | Admitting: Family Medicine

## 2014-08-19 VITALS — BP 116/72 | HR 68 | Temp 97.8°F | Ht 67.5 in | Wt 209.8 lb

## 2014-08-19 DIAGNOSIS — E785 Hyperlipidemia, unspecified: Secondary | ICD-10-CM

## 2014-08-19 DIAGNOSIS — I10 Essential (primary) hypertension: Secondary | ICD-10-CM | POA: Diagnosis not present

## 2014-08-19 DIAGNOSIS — R55 Syncope and collapse: Secondary | ICD-10-CM | POA: Diagnosis not present

## 2014-08-19 LAB — CBC WITH DIFFERENTIAL/PLATELET
BASOS ABS: 0 10*3/uL (ref 0.0–0.1)
BASOS PCT: 0.4 % (ref 0.0–3.0)
EOS PCT: 5.4 % — AB (ref 0.0–5.0)
Eosinophils Absolute: 0.4 10*3/uL (ref 0.0–0.7)
HEMATOCRIT: 44.3 % (ref 39.0–52.0)
HEMOGLOBIN: 14.8 g/dL (ref 13.0–17.0)
Lymphocytes Relative: 27.6 % (ref 12.0–46.0)
Lymphs Abs: 2.3 10*3/uL (ref 0.7–4.0)
MCHC: 33.5 g/dL (ref 30.0–36.0)
MCV: 82.9 fl (ref 78.0–100.0)
MONOS PCT: 4.7 % (ref 3.0–12.0)
Monocytes Absolute: 0.4 10*3/uL (ref 0.1–1.0)
NEUTROS ABS: 5.1 10*3/uL (ref 1.4–7.7)
Neutrophils Relative %: 61.9 % (ref 43.0–77.0)
Platelets: 319 10*3/uL (ref 150.0–400.0)
RBC: 5.34 Mil/uL (ref 4.22–5.81)
RDW: 14.6 % (ref 11.5–15.5)
WBC: 8.2 10*3/uL (ref 4.0–10.5)

## 2014-08-19 LAB — TSH: TSH: 1.68 u[IU]/mL (ref 0.35–4.50)

## 2014-08-19 NOTE — Patient Instructions (Signed)
Try lower hydrochlorothiazide dose (may 1/2 your current pills at home, new dose of 12.5mg  is at pharmacy) labwork today. EKG today. Pass by Shirlee LimerickMarion or Allison's office to set you up with head CT, ultrasound of heart and ultrasound of carotid arteries. We will contact you with results. If recurrent passing out episode, please seek urgent care.

## 2014-08-19 NOTE — Telephone Encounter (Signed)
Pt has appt today at 12:15 with DR G.

## 2014-08-19 NOTE — Assessment & Plan Note (Addendum)
Encouraged he decrease hctz to 12.5mg  daily.

## 2014-08-19 NOTE — Telephone Encounter (Signed)
Seen today. 

## 2014-08-19 NOTE — Progress Notes (Signed)
BP 116/72 mmHg  Pulse 68  Temp(Src) 97.8 F (36.6 C) (Oral)  Ht 5' 7.5" (1.715 m)  Wt 209 lb 12.8 oz (95.165 kg)  BMI 32.36 kg/m2  SpO2 97%   CC: dizziness with fall  Subjective:    Patient ID: Jeffrey Villarreal, male    DOB: 11/03/1940, 74 y.o.   MRN: [id number]  HPI: Jeffrey BrockGeorge R Villarreal is a 74 y.o. male presenting on 08/19/2014 for Dizziness and Fall   Seen here 08/16/2014 and doing wonderfully. We decreased hctz to 12.5mg  daily and pt was to monitor bp at home with change.  Presents today after fall over weekend, DOI 08/18/2014. Yesterday morning woke up like usually, then sat in recliner. Went to bathroom to have a bowel movement. Was standing over sink and fell - thinks lost consciousness. Thinks fell backwards and hit his head on wall. Denies premonitory dizziness. Since then feeling dizzy, frontal headache. Wife found him on floor and he had difficulty getting back up. Paramedics were called. Had not had breakfast yet.   H/o seizures as child.  Denies fevers/chills, palpitations, slurred speech or one sided weakness.  No prior h/o syncope, no post ictal period, no bowel/bladder incontinence or biting of tongue.   Brings 12 lead strip obtained by paramedics showing NSR rate 63, mild LAD with LAFB, normal intervals, no acute ST/T changes.  Also sugar and BP were checked - normal.  Relevant past medical, surgical, family and social history reviewed and updated as indicated. Interim medical history since our last visit reviewed. Allergies and medications reviewed and updated. Current Outpatient Prescriptions on File Prior to Visit  Medication Sig  . aspirin EC 81 MG tablet Take 81 mg by mouth daily.  Marland Kitchen. atorvastatin (LIPITOR) 40 MG tablet Take 1 tablet (40 mg total) by mouth daily.  . hydrochlorothiazide (HYDRODIURIL) 12.5 MG tablet Take 1 tablet (12.5 mg total) by mouth daily.  Marland Kitchen. lisinopril (PRINIVIL,ZESTRIL) 5 MG tablet TAKE ONE TABLET BY MOUTH ONCE DAILY   No current  facility-administered medications on file prior to visit.    Review of Systems Per HPI unless specifically indicated above     Objective:    BP 116/72 mmHg  Pulse 68  Temp(Src) 97.8 F (36.6 C) (Oral)  Ht 5' 7.5" (1.715 m)  Wt 209 lb 12.8 oz (95.165 kg)  BMI 32.36 kg/m2  SpO2 97%  Wt Readings from Last 3 Encounters:  08/19/14 209 lb 12.8 oz (95.165 kg)  08/16/14 203 lb 8 oz (92.307 kg)  06/27/14 201 lb (91.173 kg)    Physical Exam  Constitutional: He appears well-developed and well-nourished. No distress.  HENT:  Head: Normocephalic and atraumatic.  Mouth/Throat: Oropharynx is clear and moist. No oropharyngeal exudate.  No abrasion noted today.  Eyes: Conjunctivae and EOM are normal. Pupils are equal, round, and reactive to light. No scleral icterus.  Neck: Normal range of motion. Neck supple. Carotid bruit is not present. No thyromegaly present.  Cardiovascular: Normal rate, regular rhythm and intact distal pulses.   Murmur (2/6 SEM best at LUSB - new) heard. Pulmonary/Chest: Effort normal and breath sounds normal. No respiratory distress. He has no wheezes. He has no rales.  Neurological: He has normal strength. No cranial nerve deficit or sensory deficit. He displays a negative Romberg sign. Coordination and gait normal.  CN 2-12 intact FTN intact  Skin: Skin is warm and dry. No rash noted.  Vitals reviewed.  Results for orders placed or performed in visit on 08/16/14  Lipid  panel  Result Value Ref Range   Cholesterol 172 0 - 200 mg/dL   Triglycerides 409 <811 mg/dL   HDL 45 >=91 mg/dL   Total CHOL/HDL Ratio 3.8 Ratio   VLDL 21 0 - 40 mg/dL   LDL Cholesterol 478 (H) 0 - 99 mg/dL  Hemoglobin G9F  Result Value Ref Range   Hgb A1c MFr Bld 6.4 (H) <5.7 %   Mean Plasma Glucose 137 (H) <117 mg/dL  Basic metabolic panel  Result Value Ref Range   Sodium 138 135 - 145 mEq/L   Potassium 3.8 3.5 - 5.3 mEq/L   Chloride 100 96 - 112 mEq/L   CO2 30 19 - 32 mEq/L    Glucose, Bld 89 70 - 99 mg/dL   BUN 16 6 - 23 mg/dL   Creat 6.21 3.08 - 6.57 mg/dL   Calcium 9.6 8.4 - 84.6 mg/dL  Microalbumin / creatinine urine ratio  Result Value Ref Range   Microalb, Ur 0.8 <2.0 mg/dL   Creatinine, Urine 962.9 mg/dL   Microalb Creat Ratio 3.8 0.0 - 30.0 mg/g      Assessment & Plan:   Problem List Items Addressed This Visit    Syncope - Primary    Isolated episode that sounds as best I can tell to brief syncopal episode.  ?orthostatic (had not lowered HCTZ yet) vs post micturition vs other. New murmur heard on exam today. Pursue syncopal workup with EKG today, CBC, TSH, and will order head CT, carotid US and 2D echo. If recurrent discussed seeking care at ER. Today he does endorse remote h/o childhood seizures but yesterday's episode doesn't sound like seizure. Hit head on wall on fall down. Doubt concussion but will monitor closely for now.  EKG - NSR rate 70s, LAD with ?LAFB, normal intervals, no acute ST/T changes      Relevant Orders   TSH   CBC with Differential/Platelet   CT Head Wo Contrast   Carotid duplex   2D Echocardiogram without contrast   EKG 12-Lead (Completed)   Hypertension (Chronic)    Encouraged he decrease hctz to 12.5mg  daily.          Follow up plan: Return if symptoms worsen or fail to improve.

## 2014-08-19 NOTE — Assessment & Plan Note (Addendum)
Isolated episode that sounds as best I can tell to brief syncopal episode.  ?orthostatic (had not lowered HCTZ yet) vs post micturition vs other. New murmur heard on exam today. Pursue syncopal workup with EKG today, CBC, TSH, and will order head CT, carotid US and 2D echo. If recurrent discussed seeking care at ER. Today he does endorse remote h/o childhood seizures but yesterday's episode doesn't sound like seizure. Hit head on wall on fall down. Doubt concussion but will monitor closely for now.  EKG - NSR rate 70s, LAD with ?LAFB, normal intervals, no acute ST/T changes

## 2014-08-19 NOTE — Telephone Encounter (Signed)
Patient Name: Jeffrey Villarreal DOB: May 10, 1941 Initial Comment Caller states yesterday he passed out and was directed to see the doctor today. Today he is dizzy and still having head pain. He hit his head on the wall yesterday Nurse Assessment Nurse: Elijah Birkaldwell, RN, Stark BrayLynda Date/Time (Eastern Time): 08/19/2014 10:26:05 AM Confirm and document reason for call. If symptomatic, describe symptoms. ---Caller states yesterday he passed out and was directed to see the doctor today. Today he is dizzy and still having head pain. He hit his head on the wall yesterday. His wife called EMS & they checked his heart, his blood sugar & his BP. They said everything was ok. He fell & passed out when he was walking in his home. Has the patient traveled out of the country within the last 30 days? ---Not Applicable Does the patient require triage? ---Yes Related visit to physician within the last 2 weeks? ---No Does the PT have any chronic conditions? (i.e. diabetes, asthma, etc.) ---Yes List chronic conditions. ---BP & cholesterol rxs. Guidelines Guideline Title Affirmed Question Affirmed Notes Head Injury Scalp swelling, bruise or pain (all triage questions negative) Final Disposition User [facility] Wells Branchaldwell, RN, Stark BrayLynda

## 2014-08-20 ENCOUNTER — Ambulatory Visit: Payer: Medicare Other | Admitting: Family Medicine

## 2014-08-21 ENCOUNTER — Encounter: Payer: Self-pay | Admitting: *Deleted

## 2014-08-22 ENCOUNTER — Ambulatory Visit (INDEPENDENT_AMBULATORY_CARE_PROVIDER_SITE_OTHER)
Admission: RE | Admit: 2014-08-22 | Discharge: 2014-08-22 | Disposition: A | Discharge status: Home / Self Care | Payer: Medicare Other | Source: Ambulatory Visit | Attending: Family Medicine | Admitting: Family Medicine

## 2014-08-22 ENCOUNTER — Ambulatory Visit (HOSPITAL_COMMUNITY): Payer: Medicare Other | Attending: Family Medicine

## 2014-08-22 ENCOUNTER — Ambulatory Visit (HOSPITAL_BASED_OUTPATIENT_CLINIC_OR_DEPARTMENT_OTHER): Payer: Medicare Other | Admitting: Cardiology

## 2014-08-22 DIAGNOSIS — R42 Dizziness and giddiness: Secondary | ICD-10-CM | POA: Diagnosis not present

## 2014-08-22 DIAGNOSIS — E669 Obesity, unspecified: Secondary | ICD-10-CM | POA: Insufficient documentation

## 2014-08-22 DIAGNOSIS — R7309 Other abnormal glucose: Secondary | ICD-10-CM | POA: Insufficient documentation

## 2014-08-22 DIAGNOSIS — R55 Syncope and collapse: Secondary | ICD-10-CM

## 2014-08-22 DIAGNOSIS — E785 Hyperlipidemia, unspecified: Secondary | ICD-10-CM | POA: Insufficient documentation

## 2014-08-22 DIAGNOSIS — R51 Headache: Secondary | ICD-10-CM | POA: Diagnosis not present

## 2014-08-22 DIAGNOSIS — I6523 Occlusion and stenosis of bilateral carotid arteries: Secondary | ICD-10-CM

## 2014-08-22 DIAGNOSIS — Z87891 Personal history of nicotine dependence: Secondary | ICD-10-CM | POA: Insufficient documentation

## 2014-08-22 DIAGNOSIS — I10 Essential (primary) hypertension: Secondary | ICD-10-CM | POA: Diagnosis not present

## 2014-08-22 NOTE — Progress Notes (Signed)
2D Echo completed. 08/22/2014 

## 2014-08-22 NOTE — Progress Notes (Signed)
Carotid duplex performed 

## 2014-08-23 ENCOUNTER — Encounter: Payer: Self-pay | Admitting: Family Medicine

## 2014-08-23 ENCOUNTER — Encounter: Payer: Self-pay | Admitting: *Deleted

## 2014-11-26 ENCOUNTER — Telehealth: Payer: Self-pay | Admitting: Family Medicine

## 2014-11-26 ENCOUNTER — Ambulatory Visit (INDEPENDENT_AMBULATORY_CARE_PROVIDER_SITE_OTHER): Payer: Medicare Other | Admitting: Primary Care

## 2014-11-26 ENCOUNTER — Encounter: Payer: Self-pay | Admitting: Primary Care

## 2014-11-26 VITALS — BP 134/64 | HR 66 | Temp 98.0°F | Ht 67.5 in | Wt 202.4 lb

## 2014-11-26 DIAGNOSIS — R55 Syncope and collapse: Secondary | ICD-10-CM

## 2014-11-26 NOTE — Telephone Encounter (Signed)
Spoke with patient and scheduled appt today with Jae Dire. None available for Dr. Reece Agar

## 2014-11-26 NOTE — Telephone Encounter (Signed)
Pt has been having dizzy spells.  He passed out Sunday morning.  He said it lasted 1 minute.  Call back number is [phone].

## 2014-11-26 NOTE — Assessment & Plan Note (Signed)
Second occurrence, first being in March 2016. ECG showed no changes from March. No ST elevation, t-wave inversion, rate 69. Both incidences occurred after urinating. BP meds adjusted downward last visit. Due to recurrent episodes will send to cardiology for further evaluation. Labs last visit unremarkable.  Follow up with PCP in one month.

## 2014-11-26 NOTE — Progress Notes (Signed)
Subjective:    Patient ID: Jeffrey Villarreal, male    DOB: 1940-07-25, 74 y.o.   MRN: [id number]  HPI  Jeffrey Villarreal is a 74 year old male who presents today with a chief complaint of syncope. His syncopal episode occurred Sunday morning at 7am (beofre BP meds) last weekend. He was standing at the bathroom sink, felt dizzy and found himself on the floor. He fell from a standing position hitting his back on the wall.  He woke up immediately after landing on the floor. Since his episode he's continued to experience mild dizziness and frontal headaches. He denies hitting his head, confusion, additional episodes of syncope, chest pain, shortness of breath. He's taking Aspirin 81 mg daily.  Review of Systems  Constitutional: Negative for fever.  Respiratory: Negative for shortness of breath.   Cardiovascular: Negative for chest pain.  Genitourinary: Negative for dysuria and frequency.  Musculoskeletal: Negative for myalgias.  Neurological: Positive for dizziness and syncope. Negative for numbness.       Past Medical History  Diagnosis Date  . Hypertension   . Diet-controlled type 2 diabetes mellitus     DSME 02/2013  . Hyperlipidemia   . History of seizures as a child remote  . Carotid stenosis 08/2014    1-39% bilateral, rpt [place] 2 yrs    History   Social History  . Marital Status: Married    Spouse Name: N/A  . Number of Children: 7  . Years of Education: 1th grade   Occupational History  . retired     used to work at [employer], [employer]   Social History Main Topics  . Smoking status: Former Smoker    Quit date: 06/14/1985  . Smokeless tobacco: Never Used  . Alcohol Use: No     Comment: Quit 1987  . Drug Use: No  . Sexual Activity: Not on file   Other Topics Concern  . Not on file   Social History Narrative   Caffeine: 1 cup coffee.   Lives with wife, step-daughter (29yo), no pets   Occupation: retired   Activity: walks daily   Diet: good water, fruits/vegetables daily     Past Surgical History  Procedure Laterality Date  . Colonoscopy  08/2009    large int hemorrhoids, benign lymphoid polyp, rpt due 10 yrs Elnoria Howard)  . Cataract extraction Right 2013    Family History  Problem Relation Age of Onset  . Hypertension Mother   . Hypertension Father   . Cancer Neg Hx   . Coronary artery disease Neg Hx   . Stroke Neg Hx   . Diabetes Neg Hx   . Kidney disease Neg Hx   . Hyperlipidemia Neg Hx     No Known Allergies  Current Outpatient Prescriptions on File Prior to Visit  Medication Sig Dispense Refill  . aspirin EC 81 MG tablet Take 81 mg by mouth daily.    Marland Kitchen atorvastatin (LIPITOR) 40 MG tablet Take 1 tablet (40 mg total) by mouth daily. 30 tablet 3  . hydrochlorothiazide (HYDRODIURIL) 12.5 MG tablet Take 1 tablet (12.5 mg total) by mouth daily. 30 tablet 9  . lisinopril (PRINIVIL,ZESTRIL) 5 MG tablet TAKE ONE TABLET BY MOUTH ONCE DAILY 30 tablet 3   No current facility-administered medications on file prior to visit.    BP 134/64 mmHg  Pulse 66  Temp(Src) 98 F (36.7 C) (Oral)  Ht 5' 7.5" (1.715 m)  Wt 202 lb 6.4 oz (91.808 kg)  BMI 31.21 kg/m2  SpO2 98%    Objective:   Physical Exam  Constitutional: He is oriented to person, place, and time.  Eyes: EOM are normal. Pupils are equal, round, and reactive to light.  Cardiovascular: Normal rate and regular rhythm.   Pulmonary/Chest: Effort normal and breath sounds normal.  Neurological: He is alert and oriented to person, place, and time.  Skin: Skin is warm and dry.          Assessment & Plan:  ECG:  Sinus rhythm. Rate 69. No change from ECG in March 2016. No ST elevation or t-wave inversion.

## 2014-11-26 NOTE — Progress Notes (Signed)
Pre visit review using our clinic review tool, if applicable. No additional management support is needed unless otherwise documented below in the visit note. 

## 2014-11-26 NOTE — Patient Instructions (Addendum)
Stop by the front and speak with [facility] regarding your appointment for cardiology.  There was no change in your EKG from March which is good. Please call us if your headaches and/or dizziness worsen, or if you have another episode of passing out.  Stay hydrated with water. It was nice meeting you!  Schedule a follow up appointment with Dr. Sharen Hones in one month.  Syncope Syncope is a medical term for fainting or passing out. This means you lose consciousness and drop to the ground. People are generally unconscious for less than 5 minutes. You may have some muscle twitches for up to 15 seconds before waking up and returning to normal. Syncope occurs more often in older adults, but it can happen to anyone. While most causes of syncope are not dangerous, syncope can be a sign of a serious medical problem. It is important to seek medical care.  CAUSES  Syncope is caused by a sudden drop in blood flow to the brain. The specific cause is often not determined. Factors that can bring on syncope include:  Taking medicines that lower blood pressure.  Sudden changes in posture, such as standing up quickly.  Taking more medicine than prescribed.  Standing in one place for too long.  Seizure disorders.  Dehydration and excessive exposure to heat.  Low blood sugar (hypoglycemia).  Straining to have a bowel movement.  Heart disease, irregular heartbeat, or other circulatory problems.  Fear, emotional distress, seeing blood, or severe pain. SYMPTOMS  Right before fainting, you may:  Feel dizzy or light-headed.  Feel nauseous.  See all white or all black in your field of vision.  Have cold, clammy skin. DIAGNOSIS  Your health care provider will ask about your symptoms, perform a physical exam, and perform an electrocardiogram (ECG) to record the electrical activity of your heart. Your health care provider may also perform other heart or blood tests to determine the cause of your syncope  which may include:  Transthoracic echocardiogram (TTE). During echocardiography, sound waves are used to evaluate how blood flows through your heart.  Transesophageal echocardiogram (TEE).  Cardiac monitoring. This allows your health care provider to monitor your heart rate and rhythm in real time.  Holter monitor. This is a portable device that records your heartbeat and can help diagnose heart arrhythmias. It allows your health care provider to track your heart activity for several days, if needed.  Stress tests by exercise or by giving medicine that makes the heart beat faster. TREATMENT  In most cases, no treatment is needed. Depending on the cause of your syncope, your health care provider may recommend changing or stopping some of your medicines. HOME CARE INSTRUCTIONS  Have someone stay with you until you feel stable.  Do not drive, use machinery, or play sports until your health care provider says it is okay.  Keep all follow-up appointments as directed by your health care provider.  Lie down right away if you start feeling like you might faint. Breathe deeply and steadily. Wait until all the symptoms have passed.  Drink enough fluids to keep your urine clear or pale yellow.  If you are taking blood pressure or heart medicine, get up slowly and take several minutes to sit and then stand. This can reduce dizziness. SEEK IMMEDIATE MEDICAL CARE IF:   You have a severe headache.  You have unusual pain in the chest, abdomen, or back.  You are bleeding from your mouth or rectum, or you have black or tarry stool.  You have an irregular or very fast heartbeat.  You have pain with breathing.  You have repeated fainting or seizure-like jerking during an episode.  You faint when sitting or lying down.  You have confusion.  You have trouble walking.  You have severe weakness.  You have vision problems. If you fainted, call your local emergency services (911 in [place] Do  not drive yourself to the hospital.  MAKE SURE YOU:  Understand these instructions.  Will watch your condition.  Will get help right away if you are not doing well or get worse. Document Released: 05/31/2005 Document Revised: 06/05/2013 Document Reviewed: 07/30/2011 [facility] Patient Information 2015 [place], [place]. This information is not intended to replace advice given to you by your health care provider. Make sure you discuss any questions you have with your health care provider.

## 2014-11-27 ENCOUNTER — Ambulatory Visit (INDEPENDENT_AMBULATORY_CARE_PROVIDER_SITE_OTHER): Payer: Medicare Other | Admitting: Interventional Cardiology

## 2014-11-27 ENCOUNTER — Encounter: Payer: Self-pay | Admitting: Interventional Cardiology

## 2014-11-27 VITALS — BP 126/50 | HR 69 | Ht 67.0 in | Wt 202.8 lb

## 2014-11-27 DIAGNOSIS — R079 Chest pain, unspecified: Secondary | ICD-10-CM

## 2014-11-27 DIAGNOSIS — E785 Hyperlipidemia, unspecified: Secondary | ICD-10-CM

## 2014-11-27 DIAGNOSIS — R55 Syncope and collapse: Secondary | ICD-10-CM | POA: Diagnosis not present

## 2014-11-27 DIAGNOSIS — I10 Essential (primary) hypertension: Secondary | ICD-10-CM | POA: Diagnosis not present

## 2014-11-27 NOTE — Patient Instructions (Addendum)
Medication Instructions:  Your physician has recommended you make the following change in your medication:  STOP Hctz   Labwork: None   Testing/Procedures: Your physician has requested that you have a stress echocardiogram. For further information please visit [email or web address] Please follow instruction sheet as given.   Follow-Up: Your physician recommends that you schedule a follow-up appointment in: 3 months with Dr.Varanasi   Any Other Special Instructions Will Be Listed Below (If Applicable). Drink plenty of fluids    Exercise Stress Echocardiogram An exercise stress echocardiogram is a heart (cardiac) test used to check the function of your heart. This test may also be called an exercise stress echocardiography or stress echo. This stress test will check how well your heart muscle and valves are working and determine if your heart muscle is getting enough blood. You will exercise on a treadmill to naturally increase or stress the functioning of your heart.  An echocardiogram uses sound waves (ultrasound) to produce an image of your heart. If your heart does not work normally, it may indicate coronary artery disease with poor coronary blood supply. The coronary arteries are the arteries that bring blood and oxygen to your heart. LET [facility] CARE PROVIDER KNOW ABOUT:  Any allergies you have.  All medicines you are taking, including vitamins, herbs, eye drops, creams, and over-the-counter medicines.  Previous problems you or members of your family have had with the use of anesthetics.  Any blood disorders you have.  Previous surgeries you have had.  Medical conditions you have.  Possibility of pregnancy, if this applies. RISKS AND COMPLICATIONS Generally, this is a safe procedure. However, as with any procedure, complications can occur. Possible complications can include:  You develop pain or pressure in the following areas:  Chest.  Jaw or neck.  Between  your shoulder blades.  Radiating down your left arm.  Dizziness or lightheadedness.  Shortness of breath.  Increased or irregular heartbeat.  Nausea or vomiting.  Heart attack (rare). BEFORE THE PROCEDURE  Avoid all forms of caffeine for 24 hours before your test or as directed by your health care provider. This includes coffee, tea (even decaffeinated tea), caffeinated sodas, chocolate, cocoa, and certain pain medicines.  Follow your health care provider's instructions regarding eating and drinking before the test.  Take your medicines as directed at regular times with water unless instructed otherwise. Exceptions may include:  If you have diabetes, ask how you are to take your insulin or pills. It is common to adjust insulin dosing the morning of the test.  If you are taking beta-blocker medicines, it is important to talk to your health care provider about these medicines well before the date of your test. Taking beta-blocker medicines may interfere with the test. In some cases, these medicines need to be changed or stopped 24 hours or more before the test.  If you wear a nitroglycerin patch, it may need to be removed prior to the test. Ask your health care provider if the patch should be removed before the test.  If you use an inhaler for any breathing condition, bring it with you to the test.  If you are an outpatient, bring a snack so you can eat right after the stress phase of the test.  Do not smoke for 4 hours prior to the test or as directed by your health care provider.  Wear loose-fitting clothes and comfortable shoes for the test. This test involves walking on a treadmill. PROCEDURE   Multiple electrodes will be  put on your chest. If needed, small areas of your chest may be shaved to get better contact with the electrodes. Once the electrodes are attached to your body, multiple wires will be attached to the electrodes, and your heart rate will be monitored.  You will  have an echocardiogram done at rest.  To produce this image of your heart, gel is applied to your chest, and a wand-like tool (transducer) is moved over the chest. The transducer sends the sound waves through the chest to create the moving images of your heart.  You may need an IV to receive a medication that improves the quality of the pictures.  You will then walk on a treadmill. The treadmill will be started at a slow pace. The treadmill speed and incline will gradually be increased to raise your heart rate.  At the peak of exercise, the treadmill will be stopped. You will lie down immediately on a bed so that a second echocardiogram can be done to visualize your heart's motion with exercise.  The test usually takes 30-60 minutes to complete. AFTER THE PROCEDURE  Your heart rate and blood pressure will be monitored after the test.  You may return to your normal schedule, including diet, activities, and medicines, unless your health care provider tells you otherwise. Document Released: 06/04/2004 Document Revised: 06/05/2013 Document Reviewed: 02/05/2013 [facility] Patient Information 2015 [place], [place]. This information is not intended to replace advice given to you by your health care provider. Make sure you discuss any questions you have with your health care provider.

## 2014-11-27 NOTE — Progress Notes (Signed)
Patient ID: Jeffrey Villarreal, male   DOB: 1940/12/11, 74 y.o.   MRN: [id number]     Cardiology Office Note   Date:  11/27/2014   ID:  Jeffrey Villarreal, DOB May 03, 1941, MRN [id number]  PCP:  Eustaquio Boyden, MD    No chief complaint on file. syncope   Wt Readings from Last 3 Encounters:  11/27/14 202 lb 12.8 oz (91.989 kg)  11/26/14 202 lb 6.4 oz (91.808 kg)  08/19/14 209 lb 12.8 oz (95.165 kg)       History of Present Illness: Jeffrey Villarreal is a 74 y.o. male  Who has had two episodes of syncope.   THe first episode occurred in March 2016.  He stood up from a recliner, took 5-6 steps and went to wash his face.  He passed out standing at the sink falling backwards.  He thinks he had just urinated. His wife called the paramedics.  Syncope was les than a minute.  He woke up on the floor wondering how he got there.  He did not go to the hospital but went to his PMD.  He was sent to our office for a carotid DOppler which was negative for significant disease. He was not sick at the time.  No reason he would have been dehydrated.   The second episode was on Sunday 11/22/14.  He went to the bathroom and urinated.  He went to the sink and then fell back.  The drywall actually caved in and left a hole in the wall.  He got up on his own.  He reports occasional chest pain.  Not related to the stress of mowing the yard.  CP will last up to several hours.  It occurs a couple of times / month.  He does not recall a stress test.  He had an echo in 3/16 showing normal LV function.     Past Medical History  Diagnosis Date  . Hypertension   . Diet-controlled type 2 diabetes mellitus     DSME 02/2013  . Hyperlipidemia   . History of seizures as a child remote  . Carotid stenosis 08/2014    1-39% bilateral, rpt [place] 2 yrs    Past Surgical History  Procedure Laterality Date  . Colonoscopy  08/2009    large int hemorrhoids, benign lymphoid polyp, rpt due 10 yrs Elnoria Howard)  . Cataract extraction Right 2013       Current Outpatient Prescriptions  Medication Sig Dispense Refill  . aspirin EC 81 MG tablet Take 81 mg by mouth daily.    Marland Kitchen atorvastatin (LIPITOR) 40 MG tablet Take 1 tablet (40 mg total) by mouth daily. 30 tablet 3  . hydrochlorothiazide (HYDRODIURIL) 12.5 MG tablet Take 1 tablet (12.5 mg total) by mouth daily. 30 tablet 9  . lisinopril (PRINIVIL,ZESTRIL) 5 MG tablet TAKE ONE TABLET BY MOUTH ONCE DAILY 30 tablet 3   No current facility-administered medications for this visit.    Allergies:   Review of patient's allergies indicates no known allergies.    Social History:  The patient  reports that he quit smoking about 29 years ago. He has never used smokeless tobacco. He reports that he does not drink alcohol or use illicit drugs.   Family History:  The patient's *family history includes Hypertension in his father and mother. There is no history of Cancer, Coronary artery disease, Stroke, Diabetes, Kidney disease, or Hyperlipidemia.    ROS:  Please see the history of present illness.   Otherwise,  review of systems are positive for chest pain as noted above. .   All other systems are reviewed and negative.    PHYSICAL EXAM: VS:  BP 126/50 mmHg  Pulse 69  Ht  (1.702 m)  Wt 202 lb 12.8 oz (91.989 kg)  BMI 31.76 kg/m2  SpO2 96% , BMI Body mass index is 31.76 kg/(m^2). GEN: Well nourished, well developed, in no acute distress HEENT: normal Neck: no JVD, carotid bruits, or masses Cardiac: RRR; no murmurs, rubs, or gallops,no edema  Respiratory:  clear to auscultation bilaterally, normal work of breathing GI: soft, nontender, nondistended, + BS MS: no deformity or atrophy Skin: warm and dry, no rash Neuro:  Strength and sensation are intact Psych: euthymic mood, full affect   EKG:   The ekg ordered previously demonstrates normal sinus rhythm, no significant ST changes   Recent Labs: 08/16/2014: BUN 16; Creat 1.19; Potassium 3.8; Sodium 138 08/19/2014: Hemoglobin 14.8;  Platelets 319.0; TSH 1.68   Lipid Panel    Component Value Date/Time   CHOL 172 08/16/2014 1654   TRIG 105 08/16/2014 1654   HDL 45 08/16/2014 1654   CHOLHDL 3.8 08/16/2014 1654   VLDL 21 08/16/2014 1654   LDLCALC 106* 08/16/2014 1654   LDLDIRECT 165.9 01/21/2014 1535     Other studies Reviewed: Additional studies/ records that were reviewed today with results demonstrating: Prior echocardiogram showing normal left ventricular function.   ASSESSMENT AND PLAN:  1. Syncope:  Unclear etiology. He is not reporting any other symptoms that could be related to bradycardia. Consider micturition syncope since both episodes occurred in the setting of urination. I encouraged him to stay well hydrated. When he changes positions, he should do so slowly. When he stands up, he needs to have something to hold onto. He does admit that he thinks he changes positions quite quickly at this time. Given his age and his medications, he may be more prone to passing out. We'll also stop his hydrochlorothiazide. He does not have any swelling. Perhaps, if his blood pressure is getting too low or he is a bit dehydrated, stopping the HCTZ will help.  Negative carotid Doppler a few months ago. 2. Chest pain: Some atypical features but he does have multiple risk factors for CAD including hypertension, borderline blood sugar and hyperlipidemia. We'll plan for stress echocardiogram. 3. Hypertension: Continue lisinopril. Stop HCTZ. I suspect his blood pressure will still be controlled off the diuretics.   Current medicines are reviewed at length with the patient today.  The patient concerns regarding his medicines were addressed.  The following changes have been made:    Labs/ tests ordered today include: Stress echo  No orders of the defined types were placed in this encounter.    Recommend 150 minutes/week of aerobic exercise Low fat, low carb, high fiber diet recommended  Disposition:   FU in 3  months   Delorise Jackson., MD  11/27/2014 3:48 PM    [facility] [address] [place], [place], [place]  [address] Phone: [phone]; Fax: [phone]

## 2014-12-02 ENCOUNTER — Telehealth (HOSPITAL_COMMUNITY): Payer: Self-pay | Admitting: *Deleted

## 2014-12-02 NOTE — Telephone Encounter (Signed)
Left message on voicemail in reference to upcoming appointment scheduled for 12/04/14. Phone number given for a call back so details instructions can be given. Makira Holleman W   

## 2014-12-03 ENCOUNTER — Telehealth (HOSPITAL_COMMUNITY): Payer: Self-pay | Admitting: *Deleted

## 2014-12-03 NOTE — Telephone Encounter (Signed)
Left message on voicemail per DPR in reference to upcoming appointment scheduled on 12/04/14 at 7:30 with detailed instructions given per Stress Test Requisition Sheet for the test. LM to arrive 30 minutes early, and that it is imperative to arrive on time for appointment to keep from having the test rescheduled.Phone number given for call back for any questions. Daneil Dolin

## 2014-12-04 ENCOUNTER — Ambulatory Visit (HOSPITAL_COMMUNITY): Payer: Medicare Other | Attending: Cardiology

## 2014-12-04 DIAGNOSIS — R55 Syncope and collapse: Secondary | ICD-10-CM

## 2014-12-04 DIAGNOSIS — R079 Chest pain, unspecified: Secondary | ICD-10-CM | POA: Diagnosis not present

## 2014-12-13 ENCOUNTER — Other Ambulatory Visit: Payer: Self-pay | Admitting: Family Medicine

## 2014-12-27 ENCOUNTER — Encounter: Payer: Self-pay | Admitting: Family Medicine

## 2014-12-27 ENCOUNTER — Ambulatory Visit (INDEPENDENT_AMBULATORY_CARE_PROVIDER_SITE_OTHER): Payer: Medicare Other | Admitting: Family Medicine

## 2014-12-27 VITALS — BP 138/84 | HR 64 | Temp 98.1°F | Wt 204.2 lb

## 2014-12-27 DIAGNOSIS — R55 Syncope and collapse: Secondary | ICD-10-CM

## 2014-12-27 DIAGNOSIS — I10 Essential (primary) hypertension: Secondary | ICD-10-CM | POA: Diagnosis not present

## 2014-12-27 DIAGNOSIS — E669 Obesity, unspecified: Secondary | ICD-10-CM | POA: Diagnosis not present

## 2014-12-27 MED ORDER — ATORVASTATIN CALCIUM 40 MG PO TABS: 40.0000 mg | ORAL_TABLET | Freq: Every day | ORAL | Status: DC

## 2014-12-27 MED ORDER — LISINOPRIL 5 MG PO TABS: 5.0000 mg | ORAL_TABLET | Freq: Every day | ORAL | Status: DC

## 2014-12-27 NOTE — Assessment & Plan Note (Signed)
Discussed healthy lifestyle changes to affect sustainable weight loss. Encouraged mod intensity aerobic exercise. Pt enjoys walking.

## 2014-12-27 NOTE — Assessment & Plan Note (Signed)
Chronic, stable on low dose ACEI. conitnue current regimen.

## 2014-12-27 NOTE — Assessment & Plan Note (Signed)
S/p reassuring cardiology evaluation with normal exercise stress test.  ?micturition syncope but pt feels better off hctz. Continue this.

## 2014-12-27 NOTE — Progress Notes (Signed)
Pre visit review using our clinic review tool, if applicable. No additional management support is needed unless otherwise documented below in the visit note. 

## 2014-12-27 NOTE — Patient Instructions (Signed)
Return in 2-3 months for wellness visit. You are doing well today. No changes.

## 2014-12-27 NOTE — Progress Notes (Signed)
   BP 138/84 mmHg  Pulse 64  Temp(Src) 98.1 F (36.7 C) (Oral)  Wt 204 lb 4 oz (92.647 kg)   CC: 1 mo f/u visit  Subjective:    Patient ID: Jeffrey BrockGeorge R Villarreal, male    DOB: 01-01-1941, 74 y.o.   MRN: [id number]  HPI: Jeffrey Villarreal is a 74 y.o. male presenting on 12/27/2014 for Follow-up   Seen here 11/26/2014 by Mayra ReelKate Clark after second episode of syncope. Referred to cardiology, saw Dr Eldridge DaceVaranasi on 11/27/2014. Thought micturition syncope. hctz was stopped. He also underwent normal stress echocardiogram.   Blood pressure has been running well controlled since off hctz.   Has had normal echocardiogram and carotid ultrasound. labwork unremarkable.  Feels "a whole lot better," staying well hydrated. Enjoys walking, twice a day, about 30-45 min.  Relevant past medical, surgical, family and social history reviewed and updated as indicated. Interim medical history since our last visit reviewed. Allergies and medications reviewed and updated. Current Outpatient Prescriptions on File Prior to Visit  Medication Sig  . aspirin EC 81 MG tablet Take 81 mg by mouth daily.   No current facility-administered medications on file prior to visit.    Review of Systems Per HPI unless specifically indicated above     Objective:    BP 138/84 mmHg  Pulse 64  Temp(Src) 98.1 F (36.7 C) (Oral)  Wt 204 lb 4 oz (92.647 kg)  Wt Readings from Last 3 Encounters:  12/27/14 204 lb 4 oz (92.647 kg)  11/27/14 202 lb 12.8 oz (91.989 kg)  11/26/14 202 lb 6.4 oz (91.808 kg)   Body mass index is 31.98 kg/(m^2).   Physical Exam  Constitutional: He appears well-developed and well-nourished. No distress.  HENT:  Mouth/Throat: Oropharynx is clear and moist. No oropharyngeal exudate.  Cardiovascular: Normal rate, regular rhythm, normal heart sounds and intact distal pulses.   No murmur heard. Pulmonary/Chest: Effort normal and breath sounds normal. No respiratory distress. He has no wheezes. He has no rales.    Musculoskeletal: He exhibits no edema.  Skin: Skin is warm and dry. No rash noted.  Psychiatric: He has a normal mood and affect.  Nursing note and vitals reviewed.      Assessment & Plan:   Problem List Items Addressed This Visit    Hypertension (Chronic)    Chronic, stable on low dose ACEI. conitnue current regimen.      Relevant Medications   atorvastatin (LIPITOR) 40 MG tablet   lisinopril (PRINIVIL,ZESTRIL) 5 MG tablet   Obesity, Class I, BMI 30-34.9    Discussed healthy lifestyle changes to affect sustainable weight loss. Encouraged 150min mod intensity aerobic exercise. Pt enjoys walking.      Syncope - Primary    S/p reassuring cardiology evaluation with normal exercise stress test.  ?micturition syncope but pt feels better off hctz. Continue this.      Relevant Medications   atorvastatin (LIPITOR) 40 MG tablet   lisinopril (PRINIVIL,ZESTRIL) 5 MG tablet       Follow up plan: Return in about 3 months (around 03/29/2015), or as needed, for medicare wellness.

## 2015-01-31 ENCOUNTER — Telehealth: Payer: Self-pay | Admitting: Family Medicine

## 2015-01-31 NOTE — Telephone Encounter (Signed)
Spoke with patient.

## 2015-01-31 NOTE — Telephone Encounter (Signed)
Pt requesting callback regarding last cpe. I told him dates but he is still requesting cb from [facility].  Please call at [phone]

## 2015-02-16 ENCOUNTER — Other Ambulatory Visit: Payer: Self-pay | Admitting: Family Medicine

## 2015-02-16 DIAGNOSIS — E669 Obesity, unspecified: Secondary | ICD-10-CM

## 2015-02-16 DIAGNOSIS — I10 Essential (primary) hypertension: Secondary | ICD-10-CM

## 2015-02-16 DIAGNOSIS — R7303 Prediabetes: Secondary | ICD-10-CM

## 2015-02-16 DIAGNOSIS — E785 Hyperlipidemia, unspecified: Secondary | ICD-10-CM

## 2015-02-20 ENCOUNTER — Other Ambulatory Visit (INDEPENDENT_AMBULATORY_CARE_PROVIDER_SITE_OTHER): Payer: Medicare Other

## 2015-02-20 DIAGNOSIS — R7309 Other abnormal glucose: Secondary | ICD-10-CM

## 2015-02-20 DIAGNOSIS — E785 Hyperlipidemia, unspecified: Secondary | ICD-10-CM | POA: Diagnosis not present

## 2015-02-20 DIAGNOSIS — R7303 Prediabetes: Secondary | ICD-10-CM

## 2015-02-20 DIAGNOSIS — I10 Essential (primary) hypertension: Secondary | ICD-10-CM

## 2015-02-20 LAB — RENAL FUNCTION PANEL
Albumin: 4.2 g/dL (ref 3.5–5.2)
BUN: 14 mg/dL (ref 6–23)
CALCIUM: 9.8 mg/dL (ref 8.4–10.5)
CO2: 32 meq/L (ref 19–32)
CREATININE: 1.31 mg/dL (ref 0.40–1.50)
Chloride: 103 mEq/L (ref 96–112)
GFR: 68.73 mL/min (ref >60.00)
Glucose, Bld: 112 mg/dL — ABNORMAL HIGH (ref 70–99)
Phosphorus: 4.3 mg/dL (ref 2.3–4.6)
Potassium: 4.6 mEq/L (ref 3.5–5.1)
Sodium: 140 mEq/L (ref 135–145)

## 2015-02-20 LAB — HEMOGLOBIN A1C: Hgb A1c MFr Bld: 6.3 % (ref 4.6–6.5)

## 2015-02-20 LAB — LDL CHOLESTEROL, DIRECT: Direct LDL: 86 mg/dL

## 2015-02-25 ENCOUNTER — Ambulatory Visit (INDEPENDENT_AMBULATORY_CARE_PROVIDER_SITE_OTHER): Payer: Medicare Other | Admitting: Family Medicine

## 2015-02-25 ENCOUNTER — Encounter: Payer: Self-pay | Admitting: Family Medicine

## 2015-02-25 VITALS — BP 160/90 | HR 72 | Temp 98.1°F | Ht 66.0 in | Wt 205.5 lb

## 2015-02-25 DIAGNOSIS — I10 Essential (primary) hypertension: Secondary | ICD-10-CM

## 2015-02-25 DIAGNOSIS — H9193 Unspecified hearing loss, bilateral: Secondary | ICD-10-CM

## 2015-02-25 DIAGNOSIS — R7303 Prediabetes: Secondary | ICD-10-CM

## 2015-02-25 DIAGNOSIS — H6123 Impacted cerumen, bilateral: Secondary | ICD-10-CM

## 2015-02-25 DIAGNOSIS — I499 Cardiac arrhythmia, unspecified: Secondary | ICD-10-CM

## 2015-02-25 DIAGNOSIS — E669 Obesity, unspecified: Secondary | ICD-10-CM

## 2015-02-25 DIAGNOSIS — Z0000 Encounter for general adult medical examination without abnormal findings: Secondary | ICD-10-CM | POA: Diagnosis not present

## 2015-02-25 DIAGNOSIS — H6120 Impacted cerumen, unspecified ear: Secondary | ICD-10-CM | POA: Insufficient documentation

## 2015-02-25 DIAGNOSIS — E785 Hyperlipidemia, unspecified: Secondary | ICD-10-CM

## 2015-02-25 DIAGNOSIS — I6523 Occlusion and stenosis of bilateral carotid arteries: Secondary | ICD-10-CM

## 2015-02-25 DIAGNOSIS — Z7189 Other specified counseling: Secondary | ICD-10-CM

## 2015-02-25 DIAGNOSIS — H9192 Unspecified hearing loss, left ear: Secondary | ICD-10-CM

## 2015-02-25 DIAGNOSIS — R413 Other amnesia: Secondary | ICD-10-CM

## 2015-02-25 DIAGNOSIS — K5900 Constipation, unspecified: Secondary | ICD-10-CM

## 2015-02-25 MED ORDER — LISINOPRIL 10 MG PO TABS: 10.0000 mg | ORAL_TABLET | Freq: Every day | ORAL | Status: DC

## 2015-02-25 NOTE — Assessment & Plan Note (Signed)
Preventative protocols reviewed and updated unless pt declined. Discussed healthy diet and lifestyle.  Declines immunization. DRE/PSA by urologist.

## 2015-02-25 NOTE — Assessment & Plan Note (Signed)
Stable. Continue A1c.

## 2015-02-25 NOTE — Assessment & Plan Note (Signed)
Mild, Q2 yr ultrasound recommended

## 2015-02-25 NOTE — Patient Instructions (Addendum)
Advanced directive packet provided today. Think about pneumonia shots (see below handout).  For indigestion - try increase in yogurt probiotic such as [place].  Increase lisinopril to  daily - new dose at pharmacy. May double up on current dose. Nice to see you today, call us with questions.  Return in 6 months for follow up visit.  Pneumococcal Vaccine, Polyvalent solution for injection What is this medicine? PNEUMOCOCCAL VACCINE, POLYVALENT (NEU mo KOK al vak SEEN, pol ee VEY luhnt) is a vaccine to prevent pneumococcus bacteria infection. These bacteria are a major cause of ear infections, Strep throat infections, and serious pneumonia, meningitis, or blood infections worldwide. These vaccines help the body to produce antibodies (protective substances) that help your body defend against these bacteria. This vaccine is recommended for people 60 years of age and older with health problems. It is also recommended for all adults over 1 years old. This vaccine will not treat an infection. This medicine may be used for other purposes; ask your health care provider or pharmacist if you have questions. COMMON BRAND NAME(S): Pneumovax 23 What should I tell my health care provider before I take this medicine? They need to know if you have any of these conditions: -bleeding problems -bone marrow or organ transplant -cancer, Hodgkin's disease -fever -infection -immune system problems -low platelet count in the blood -seizures -an unusual or allergic reaction to pneumococcal vaccine, diphtheria toxoid, other vaccines, latex, other medicines, foods, dyes, or preservatives -pregnant or trying to get pregnant -breast-feeding How should I use this medicine? This vaccine is for injection into a muscle or under the skin. It is given by a health care professional. A copy of Vaccine Information Statements will be given before each vaccination. Read this sheet carefully each time. The sheet may change  frequently. Talk to your pediatrician regarding the use of this medicine in children. While this drug may be prescribed for children as young as 5 years of age for selected conditions, precautions do apply. Overdosage: If you think you have taken too much of this medicine contact a poison control center or emergency room at once. NOTE: This medicine is only for you. Do not share this medicine with others. What if I miss a dose? It is important not to miss your dose. Call your doctor or health care professional if you are unable to keep an appointment. What may interact with this medicine? -medicines for cancer chemotherapy -medicines that suppress your immune function -medicines that treat or prevent blood clots like warfarin, enoxaparin, and dalteparin -steroid medicines like prednisone or cortisone This list may not describe all possible interactions. Give your health care provider a list of all the medicines, herbs, non-prescription drugs, or dietary supplements you use. Also tell them if you smoke, drink alcohol, or use illegal drugs. Some items may interact with your medicine. What should I watch for while using this medicine? Mild fever and pain should go away in 3 days or less. Report any unusual symptoms to your doctor or health care professional. What side effects may I notice from receiving this medicine? Side effects that you should report to your doctor or health care professional as soon as possible: -allergic reactions like skin rash, itching or hives, swelling of the face, lips, or tongue -breathing problems -confused -fever over 102 degrees F -pain, tingling, numbness in the hands or feet -seizures -unusual bleeding or bruising -unusual muscle weakness Side effects that usually do not require medical attention (report to your doctor or health care  professional if they continue or are bothersome): -aches and pains -diarrhea -fever of 102 degrees F or  less -headache -irritable -loss of appetite -pain, tender at site where injected -trouble sleeping This list may not describe all possible side effects. Call your doctor for medical advice about side effects. You may report side effects to FDA at 1-800-FDA-1088. Where should I keep my medicine? This does not apply. This vaccine is given in a clinic, pharmacy, doctor's office, or other health care setting and will not be stored at home. NOTE: This sheet is a summary. It may not cover all possible information. If you have questions about this medicine, talk to your doctor, pharmacist, or health care provider.  2015, Elsevier/Gold Standard. (2008-01-05 14:32:37)

## 2015-02-25 NOTE — Progress Notes (Addendum)
BP 160/90 mmHg  Pulse 72  Temp(Src) 98.1 F (36.7 C) (Oral)  Ht 5\' 6"  (1.676 m)  Wt 205 lb 8 oz (93.214 kg)  BMI 33.18 kg/m2   CC: medicare wellness visit  Subjective:    Patient ID: Jeffrey Villarreal, male    DOB: 1941-04-28, 74 y.o.   MRN: [id number]  HPI: Jeffrey Villarreal is a 74 y.o. male presenting on 02/25/2015 for Annual Exam   Recent micturition syncope - improved stopping HCTZ. bp remaining elevated however.  Hearing - failed today on left. Occasional problems with hearing.  Vision screen - per eye clinic yearly No falls in last year.  Denies depression, anhedonia.   Preventative: Colonoscopy 08/2009, large int hemorrhoids, benign lymphoid polyp, rpt due 10 yrs Elnoria Howard)  Prostate - screening done at [facility] urology yearly. No h/o prostate problems, no family hx prostate cancer. Nocturia x1. Regular stream. Never taken flu shot - declines Tetanus - unsure, thinks ~2009  Pneumococcal - never received. Discussed reasons to immunize. Currently declines  zostavax - never received. Not currently interested   Advanced directives - no directive. Would want wife to be HCPOA, packet provided today Seat belt use 100%. No changing moles on skin.  Caffeine: 1 cup coffee.  Lives with wife, step-daughter (29yo), no pets  Occupation: retired  Activity: walks daily  Diet: good water, fruits/vegetables daily   Relevant past medical, surgical, family and social history reviewed and updated as indicated. Interim medical history since our last visit reviewed. Allergies and medications reviewed and updated. Current Outpatient Prescriptions on File Prior to Visit  Medication Sig  . aspirin EC 81 MG tablet Take 81 mg by mouth daily.  Marland Kitchen atorvastatin (LIPITOR) 40 MG tablet Take 1 tablet (40 mg total) by mouth daily.   No current facility-administered medications on file prior to visit.    Review of Systems  Constitutional: Negative for fever, chills, activity change, appetite  change, fatigue and unexpected weight change.  HENT: Negative for hearing loss.   Eyes: Negative for visual disturbance.  Respiratory: Positive for shortness of breath (intermittent). Negative for cough, chest tightness and wheezing.   Cardiovascular: Negative for chest pain, palpitations and leg swelling.  Gastrointestinal: Positive for constipation (mild). Negative for nausea, vomiting, abdominal pain, diarrhea, blood in stool and abdominal distention.  Genitourinary: Negative for hematuria and difficulty urinating.  Musculoskeletal: Negative for myalgias, arthralgias and neck pain.  Skin: Negative for rash.  Neurological: Negative for dizziness, seizures, syncope and headaches.  Hematological: Negative for adenopathy. Does not bruise/bleed easily.  Psychiatric/Behavioral: Negative for dysphoric mood. The patient is not nervous/anxious.    Per HPI unless specifically indicated above     Objective:    BP 160/90 mmHg  Pulse 72  Temp(Src) 98.1 F (36.7 C) (Oral)  Ht 5\' 6"  (1.676 m)  Wt 205 lb 8 oz (93.214 kg)  BMI 33.18 kg/m2  Wt Readings from Last 3 Encounters:  02/25/15 205 lb 8 oz (93.214 kg)  12/27/14 204 lb 4 oz (92.647 kg)  11/27/14 202 lb 12.8 oz (91.989 kg)    Physical Exam  Constitutional: He is oriented to person, place, and time. He appears well-developed and well-nourished. No distress.  HENT:  Head: Normocephalic and atraumatic.  Right Ear: Hearing, tympanic membrane, external ear and ear canal normal.  Left Ear: Hearing, tympanic membrane, external ear and ear canal normal.  Nose: Nose normal.  Mouth/Throat: Uvula is midline, oropharynx is clear and moist and mucous membranes are normal. No oropharyngeal  exudate, posterior oropharyngeal edema or posterior oropharyngeal erythema.  bilat cerumen impaction L>R  Eyes: Conjunctivae and EOM are normal. Pupils are equal, round, and reactive to light. No scleral icterus.  Neck: Normal range of motion. Neck supple. No  thyromegaly present.  Cardiovascular: Normal rate, regular rhythm, normal heart sounds and intact distal pulses.   No murmur heard. Pulses:      Radial pulses are 2+ on the right side, and 2+ on the left side.  Pulmonary/Chest: Effort normal and breath sounds normal. No respiratory distress. He has no wheezes. He has no rales.  Abdominal: Soft. Bowel sounds are normal. He exhibits no distension and no mass. There is no tenderness. There is no rebound and no guarding.  Musculoskeletal: Normal range of motion. He exhibits no edema.  Lymphadenopathy:    He has no cervical adenopathy.  Neurological: He is alert and oriented to person, place, and time.  CN grossly intact, station and gait intact Recall 0/3, 0/3 with cue Calculation 5/5 serial 3s  Skin: Skin is warm and dry. No rash noted.  Psychiatric: He has a normal mood and affect. His behavior is normal. Judgment and thought content normal.  Nursing note and vitals reviewed.  Results for orders placed or performed in visit on 02/20/15  Renal function panel  Result Value Ref Range   Sodium 140 135 - 145 mEq/L   Potassium 4.6 3.5 - 5.1 mEq/L   Chloride 103 96 - 112 mEq/L   CO2 32 19 - 32 mEq/L   Calcium 9.8 8.4 - 10.5 mg/dL   Albumin 4.2 3.5 - 5.2 g/dL   BUN 14 6 - 23 mg/dL   Creatinine, Ser 1.61 0.40 - 1.50 mg/dL   Glucose, Bld 096 (H) 70 - 99 mg/dL   Phosphorus 4.3 2.3 - 4.6 mg/dL   GFR 04.54 >09.81 mL/min  Hemoglobin A1c  Result Value Ref Range   Hgb A1c MFr Bld 6.3 4.6 - 6.5 %  LDL Cholesterol, Direct  Result Value Ref Range   Direct LDL 86.0 mg/dL      Assessment & Plan:   Problem List Items Addressed This Visit    Hypertension (Chronic)    Deteriorated off hctz. Increase lisinopril to 10mg  daily.      Relevant Medications   lisinopril (PRINIVIL,ZESTRIL) 10 MG tablet   Prediabetes    Stable. Continue A1c.       Medicare annual wellness visit, subsequent - Primary    I have personally reviewed the Medicare  Annual Wellness questionnaire and have noted 1. The patient's medical and social history 2. Their use of alcohol, tobacco or illicit drugs 3. Their current medications and supplements 4. The patient's functional ability including ADL's, fall risks, home safety risks and hearing or visual impairment. Cognitive function has been assessed and addressed as indicated.  5. Diet and physical activity 6. Evidence for depression or mood disorders The patients weight, height, BMI have been recorded in the chart. I have made referrals, counseling and provided education to the patient based on review of the above and I have provided the pt with a written personalized care plan for preventive services. Provider list updated.. See scanned questionairre as needed for further documentation. Reviewed preventative protocols and updated unless pt declined.       Hyperlipidemia    Chronic, stable. Continue lipitor 40mg  daily.      Relevant Medications   lisinopril (PRINIVIL,ZESTRIL) 10 MG tablet   Constipation    Suggested daily activia.  Health maintenance examination    Preventative protocols reviewed and updated unless pt declined. Discussed healthy diet and lifestyle.  Declines immunization. DRE/PSA by urologist.      Decreased hearing of left ear    Continue to monitor.      Advanced care planning/counseling discussion    Advanced directives - no directive. Would want wife to be HCPOA, packet provided today      Obesity, Class I, BMI 30-34.9    Encouraged healthy activity for sustainable weight loss.      Carotid stenosis    Mild, Q2 yr ultrasound recommended      Relevant Medications   lisinopril (PRINIVIL,ZESTRIL) 10 MG tablet   Memory deficit    Failed recall today. Overall slowed responses again noted. No rigidity. No anosmia. Denies memory troubles. No noted tremors.      Bilateral hearing loss due to cerumen impaction    Cerumen removed using plastic curette, but still  significant amt remains. irrigation performed with persistent wax. Discussed home care with colace liquid drops. RTC 1-2 wks for re eval if persistent trouble. pt agrees with plan.       Other Visit Diagnoses    Irregular heart beat        Relevant Orders    EKG 12-Lead (Completed)       EKG today - NSR rate 60, LAD with LAFB, normal intervals, poor R wave progression, 2 PVCs, ?anterior ST/T changes overall unchanged from EKG 08/2014   Follow up plan: Return in about 6 months (around 08/25/2015), or as needed, for follow up visit.

## 2015-02-25 NOTE — Assessment & Plan Note (Signed)
Continue to monitor

## 2015-02-25 NOTE — Assessment & Plan Note (Addendum)
Suggested daily activia.

## 2015-02-25 NOTE — Addendum Note (Signed)
Addended by: Eustaquio Boyden on: 02/25/2015 05:32 PM   Modules accepted: Kipp Brood

## 2015-02-25 NOTE — Assessment & Plan Note (Signed)
Deteriorated off hctz. Increase lisinopril to  daily.

## 2015-02-25 NOTE — Assessment & Plan Note (Signed)
Encouraged healthy activity for sustainable weight loss.

## 2015-02-25 NOTE — Assessment & Plan Note (Addendum)
Cerumen removed using plastic curette, but still significant amt remains. irrigation performed with persistent wax. Discussed home care with colace liquid drops. RTC 1-2 wks for re eval if persistent trouble. pt agrees with plan.

## 2015-02-25 NOTE — Assessment & Plan Note (Signed)
Chronic, stable. Continue lipitor 40mg daily 

## 2015-02-25 NOTE — Assessment & Plan Note (Signed)
Advanced directives - no directive. Would want wife to be HCPOA, packet provided today.  

## 2015-02-25 NOTE — Progress Notes (Signed)
Pre visit review using our clinic review tool, if applicable. No additional management support is needed unless otherwise documented below in the visit note. 

## 2015-02-25 NOTE — Assessment & Plan Note (Addendum)
Failed recall today. Overall slowed responses again noted. No rigidity. No anosmia. Denies memory troubles. No noted tremors.

## 2015-02-25 NOTE — Assessment & Plan Note (Signed)

## 2015-02-26 NOTE — Addendum Note (Signed)
Addended by: Eustaquio Boyden on: 02/26/2015 05:51 PM   Modules accepted: Kipp Brood

## 2015-03-03 ENCOUNTER — Telehealth: Payer: Self-pay | Admitting: Family Medicine

## 2015-03-03 ENCOUNTER — Ambulatory Visit: Payer: Medicare Other | Admitting: Interventional Cardiology

## 2015-03-03 NOTE — Telephone Encounter (Signed)
Pts spouse dropped off medical evaluation form to be completed for social services. Form on Jeffrey Villarreal's desk. Thank you.

## 2015-03-03 NOTE — Telephone Encounter (Signed)
In your IN box for completion.  

## 2015-03-04 NOTE — Telephone Encounter (Signed)
Filled and in Kim's box. 

## 2015-03-04 NOTE — Telephone Encounter (Signed)
Message left notifying patient's wife. Form placed up front for pick up.

## 2015-03-06 ENCOUNTER — Other Ambulatory Visit: Payer: Self-pay | Admitting: Family Medicine

## 2015-03-11 ENCOUNTER — Ambulatory Visit: Payer: Medicare Other | Admitting: Family Medicine

## 2015-03-18 ENCOUNTER — Ambulatory Visit (INDEPENDENT_AMBULATORY_CARE_PROVIDER_SITE_OTHER): Payer: Medicare Other | Admitting: Family Medicine

## 2015-03-18 ENCOUNTER — Encounter: Payer: Self-pay | Admitting: Family Medicine

## 2015-03-18 VITALS — BP 160/90 | HR 72 | Temp 97.9°F | Wt 205.8 lb

## 2015-03-18 DIAGNOSIS — I10 Essential (primary) hypertension: Secondary | ICD-10-CM | POA: Diagnosis not present

## 2015-03-18 DIAGNOSIS — H6120 Impacted cerumen, unspecified ear: Secondary | ICD-10-CM

## 2015-03-18 DIAGNOSIS — H918X9 Other specified hearing loss, unspecified ear: Secondary | ICD-10-CM

## 2015-03-18 DIAGNOSIS — H6123 Impacted cerumen, bilateral: Secondary | ICD-10-CM

## 2015-03-18 DIAGNOSIS — E785 Hyperlipidemia, unspecified: Secondary | ICD-10-CM | POA: Diagnosis not present

## 2015-03-18 MED ORDER — ATORVASTATIN CALCIUM 40 MG PO TABS: 40.0000 mg | ORAL_TABLET | Freq: Every day | ORAL | Status: DC

## 2015-03-18 NOTE — Assessment & Plan Note (Signed)
bp elevated - but he did not take am meds. Will take lisinopril right when he gets home.

## 2015-03-18 NOTE — Patient Instructions (Addendum)
For cholesterol we are on atorvastatin (lipitor)  daily. For blood pressure we are on lisinopril  daily. Check blood pressure at pharmacy when able. Goal <140/90. Let me know if consistently higher. Ears irrigated again today.

## 2015-03-18 NOTE — Assessment & Plan Note (Signed)
Failed rpt attempt at cerumen removal and irrigation.  Pt denies significant hearing loss from cerumen. Offered ENT referral - pt will monitor sxs for now and let us know if pain develops for referral.

## 2015-03-18 NOTE — Progress Notes (Signed)
   BP 160/90 mmHg  Pulse 72  Temp(Src) 97.9 F (36.6 C) (Oral)  Wt 205 lb 12 oz (93.328 kg)   CC: f/u cerumen impaction  Subjective:    Patient ID: Jeffrey Villarreal, male    DOB: 05/06/1941, 74 y.o.   MRN: [id number]  HPI: Jeffrey Villarreal is a 74 y.o. male presenting on 03/18/2015 for Follow-up   See prior note for details. Found to have bilateral hearing loss due to cerumen impaction - unsuccessful irrigation and curette use. Recommended home colace and then RTC.   He has used colace and feels hearing has improved.  Had some L hearing loss presumed due to wax.  bp elevated today - he did not take am meds. Does not check at home. He will take this when he gets home.   Relevant past medical, surgical, family and social history reviewed and updated as indicated. Interim medical history since our last visit reviewed. Allergies and medications reviewed and updated. Current Outpatient Prescriptions on File Prior to Visit  Medication Sig  . aspirin EC 81 MG tablet Take 81 mg by mouth daily.  Marland Kitchen lisinopril (PRINIVIL,ZESTRIL) 10 MG tablet Take 1 tablet (10 mg total) by mouth daily.   No current facility-administered medications on file prior to visit.    Review of Systems Per HPI unless specifically indicated above     Objective:    BP 160/90 mmHg  Pulse 72  Temp(Src) 97.9 F (36.6 C) (Oral)  Wt 205 lb 12 oz (93.328 kg)  Wt Readings from Last 3 Encounters:  03/18/15 205 lb 12 oz (93.328 kg)  02/25/15 205 lb 8 oz (93.214 kg)  12/27/14 204 lb 4 oz (92.647 kg)    Physical Exam  Constitutional: He appears well-developed and well-nourished. No distress.  HENT:  Right Ear: Hearing, external ear and ear canal normal.  Left Ear: Hearing, external ear and ear canal normal.  Cerumen impaction bilaterally, soft wax. Disimpaction performed unsuccessfully with plastic curette so irrigation performed bilaterally. Also with unsuccessful irrigation today.  Nursing note and vitals  reviewed.      Assessment & Plan:   Problem List Items Addressed This Visit    Hypertension (Chronic)    bp elevated - but he did not take am meds. Will take lisinopril right when he gets home.       Relevant Medications   atorvastatin (LIPITOR) 40 MG tablet   Hyperlipidemia    Reviewed med list with patient continue lipitor  daily generic.      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  Pt denies significant hearing loss from cerumen. Offered ENT referral - pt will monitor sxs for now and let us know if pain develops for referral.          Follow up plan: Return as needed.

## 2015-03-18 NOTE — Progress Notes (Signed)
Pre visit review using our clinic review tool, if applicable. No additional management support is needed unless otherwise documented below in the visit note. 

## 2015-03-18 NOTE — Assessment & Plan Note (Signed)
Reviewed med list with patient continue lipitor  daily generic.

## 2015-04-18 ENCOUNTER — Other Ambulatory Visit: Payer: Self-pay | Admitting: Family Medicine

## 2015-08-26 ENCOUNTER — Ambulatory Visit: Payer: Medicare Other | Admitting: Family Medicine

## 2015-09-15 ENCOUNTER — Ambulatory Visit (INDEPENDENT_AMBULATORY_CARE_PROVIDER_SITE_OTHER): Payer: Medicare Other | Admitting: Primary Care

## 2015-09-15 ENCOUNTER — Encounter: Payer: Self-pay | Admitting: Primary Care

## 2015-09-15 VITALS — BP 152/90 | HR 60 | Temp 98.0°F | Ht 66.0 in | Wt 206.8 lb

## 2015-09-15 DIAGNOSIS — R059 Cough, unspecified: Secondary | ICD-10-CM

## 2015-09-15 DIAGNOSIS — R05 Cough: Secondary | ICD-10-CM | POA: Diagnosis not present

## 2015-09-15 MED ORDER — BENZONATATE 200 MG PO CAPS: 200.0000 mg | ORAL_CAPSULE | Freq: Three times a day (TID) | ORAL | Status: DC | PRN

## 2015-09-15 NOTE — Patient Instructions (Addendum)
You may take Benzonatate capsules for cough. Take 1 capsule by mouth three times daily as needed for cough.  Nasal Congestion: Try using Flonase (fluticasone) nasal spray. Instill 2 sprays in each nostril once daily.   Cough/Congestion: Try taking Mucinex DM. This will help loosen up the mucous in your chest. Ensure you take this medication with a full glass of water.  Please notify me if you develop persistent fevers of 101, start coughing up green mucous, notice increased fatigue or weakness, or feel worse after Wednesday this week.  Increase consumption of water intake and rest.  It was a pleasure meeting you!  Upper Respiratory Infection, Adult Most upper respiratory infections (URIs) are a viral infection of the air passages leading to the lungs. A URI affects the nose, throat, and upper air passages. The most common type of URI is nasopharyngitis and is typically referred to as "the common cold." URIs run their course and usually go away on their own. Most of the time, a URI does not require medical attention, but sometimes a bacterial infection in the upper airways can follow a viral infection. This is called a secondary infection. Sinus and middle ear infections are common types of secondary upper respiratory infections. Bacterial pneumonia can also complicate a URI. A URI can worsen asthma and chronic obstructive pulmonary disease (COPD). Sometimes, these complications can require emergency medical care and may be life threatening.  CAUSES Almost all URIs are caused by viruses. A virus is a type of germ and can spread from one person to another.  RISKS FACTORS You may be at risk for a URI if:   You smoke.   You have chronic heart or lung disease.  You have a weakened defense (immune) system.   You are very young or very old.   You have nasal allergies or asthma.  You work in crowded or poorly ventilated areas.  You work in health care facilities or schools. SIGNS AND  SYMPTOMS  Symptoms typically develop 2-3 days after you come in contact with a cold virus. Most viral URIs last 7-10 days. However, viral URIs from the influenza virus (flu virus) can last 14-18 days and are typically more severe. Symptoms may include:   Runny or stuffy (congested) nose.   Sneezing.   Cough.   Sore throat.   Headache.   Fatigue.   Fever.   Loss of appetite.   Pain in your forehead, behind your eyes, and over your cheekbones (sinus pain).  Muscle aches.  DIAGNOSIS  Your health care provider may diagnose a URI by:  Physical exam.  Tests to check that your symptoms are not due to another condition such as:  Strep throat.  Sinusitis.  Pneumonia.  Asthma. TREATMENT  A URI goes away on its own with time. It cannot be cured with medicines, but medicines may be prescribed or recommended to relieve symptoms. Medicines may help:  Reduce your fever.  Reduce your cough.  Relieve nasal congestion. HOME CARE INSTRUCTIONS   Take medicines only as directed by your health care provider.   Gargle warm saltwater or take cough drops to comfort your throat as directed by your health care provider.  Use a warm mist humidifier or inhale steam from a shower to increase air moisture. This may make it easier to breathe.  Drink enough fluid to keep your urine clear or pale yellow.   Eat soups and other clear broths and maintain good nutrition.   Rest as needed.   Return  to work when your temperature has returned to normal or as your health care provider advises. You may need to stay home longer to avoid infecting others. You can also use a face mask and careful hand washing to prevent spread of the virus.  Increase the usage of your inhaler if you have asthma.   Do not use any tobacco products, including cigarettes, chewing tobacco, or electronic cigarettes. If you need help quitting, ask your health care provider. PREVENTION  The best way to  protect yourself from getting a cold is to practice good hygiene.   Avoid oral or hand contact with people with cold symptoms.   Wash your hands often if contact occurs.  There is no clear evidence that vitamin C, vitamin E, echinacea, or exercise reduces the chance of developing a cold. However, it is always recommended to get plenty of rest, exercise, and practice good nutrition.  SEEK MEDICAL CARE IF:   You are getting worse rather than better.   Your symptoms are not controlled by medicine.   You have chills.  You have worsening shortness of breath.  You have brown or red mucus.  You have yellow or brown nasal discharge.  You have pain in your face, especially when you bend forward.  You have a fever.  You have swollen neck glands.  You have pain while swallowing.  You have white areas in the back of your throat. SEEK IMMEDIATE MEDICAL CARE IF:   You have severe or persistent:  Headache.  Ear pain.  Sinus pain.  Chest pain.  You have chronic lung disease and any of the following:  Wheezing.  Prolonged cough.  Coughing up blood.  A change in your usual mucus.  You have a stiff neck.  You have changes in your:  Vision.  Hearing.  Thinking.  Mood. MAKE SURE YOU:   Understand these instructions.  Will watch your condition.  Will get help right away if you are not doing well or get worse.   This information is not intended to replace advice given to you by your health care provider. Make sure you discuss any questions you have with your health care provider.   Document Released: 11/24/2000 Document Revised: 10/15/2014 Document Reviewed: 09/05/2013 Elsevier Interactive Patient Education [employer]2016 [employer]

## 2015-09-15 NOTE — Progress Notes (Signed)
Pre visit review using our clinic review tool, if applicable. No additional management support is needed unless otherwise documented below in the visit note. 

## 2015-09-15 NOTE — Progress Notes (Signed)
Subjective:    Patient ID: Jeffrey Villarreal, male    DOB: 01-Dec-1940, 75 y.o.   MRN: [id number]  HPI  Mr. Peterson AoCrooks is a 75 year old male who presents today with a chief complaint of cough. He also reports nasal congestion, chest congestion. His symptoms have been present for 6-7 days. His cough is mildly productive with whitish/brown sputum. Denies fevers, sore throat, sinus pressure, shortness of breath. His cough became worse Friday last week and hasn't improved since. He's taking Robitussin OTC without improvement. He doesn't feel ill, overall he's feeling well except for his cough. Denies sick contacts.  Review of Systems  Constitutional: Positive for fatigue. Negative for fever and chills.  HENT: Positive for congestion. Negative for ear pain, sinus pressure and sore throat.   Respiratory: Positive for cough. Negative for shortness of breath.   Cardiovascular: Negative for chest pain.  Musculoskeletal: Negative for myalgias.       Past Medical History  Diagnosis Date  . Hypertension   . Diet-controlled type 2 diabetes mellitus (HCC)     DSME 02/2013  . Hyperlipidemia   . History of seizures as a child remote  . Carotid stenosis 08/2014    1-39% bilateral, rpt [place] 2 yrs    Social History   Social History  . Marital Status: Married    Spouse Name: N/A  . Number of Children: 7  . Years of Education: 1th grade   Occupational History  . retired     used to work at [employer], [employer]   Social History Main Topics  . Smoking status: Former Smoker    Quit date: 06/14/1985  . Smokeless tobacco: Never Used  . Alcohol Use: No     Comment: Quit 1987  . Drug Use: No  . Sexual Activity: Not on file   Other Topics Concern  . Not on file   Social History Narrative   Caffeine: 1 cup coffee.   Lives with wife, step-daughter (29yo), no pets   Occupation: retired   Activity: walks daily   Diet: good water, fruits/vegetables daily    Past Surgical History  Procedure Laterality Date    . Colonoscopy  08/2009    large int hemorrhoids, benign lymphoid polyp, rpt due 10 yrs Elnoria Howard([place])  . Cataract extraction Right 2013  . Dobutamine stress echo  2016    WNL Eldridge Dace([place])    Family History  Problem Relation Age of Onset  . Hypertension Mother   . Hypertension Father   . Cancer Neg Hx   . Coronary artery disease Neg Hx   . Stroke Neg Hx   . Diabetes Neg Hx   . Kidney disease Neg Hx   . Hyperlipidemia Neg Hx     No Known Allergies  Current Outpatient Prescriptions on File Prior to Visit  Medication Sig Dispense Refill  . aspirin EC 81 MG tablet Take 81 mg by mouth daily.    Marland Kitchen. atorvastatin (LIPITOR) 40 MG tablet Take 1 tablet (40 mg total) by mouth daily. 90 tablet 3  . lisinopril (PRINIVIL,ZESTRIL) 10 MG tablet Take 1 tablet (10 mg total) by mouth daily. 90 tablet 3   No current facility-administered medications on file prior to visit.    BP 152/90 mmHg  Pulse 60  Temp(Src) 98 F (36.7 C) (Oral)  Ht 5\' 6"  (1.676 m)  Wt 206 lb 12.8 oz (93.804 kg)  BMI 33.39 kg/m2  SpO2 96%    Objective:   Physical Exam  Constitutional: He appears  well-nourished.  HENT:  Right Ear: Tympanic membrane and ear canal normal.  Left Ear: Tympanic membrane and ear canal normal.  Nose: No mucosal edema. Right sinus exhibits no maxillary sinus tenderness and no frontal sinus tenderness. Left sinus exhibits no maxillary sinus tenderness and no frontal sinus tenderness.  Mouth/Throat: Oropharynx is clear and moist.  Eyes: Conjunctivae are normal.  Neck: Neck supple.  Cardiovascular: Normal rate and regular rhythm.   Pulmonary/Chest: Effort normal and breath sounds normal. He has no wheezes. He has no rales.  Skin: Skin is warm and dry.          Assessment & Plan:  URI:  Cough, mild congestion x 6-7 days. Overall feeling well, except for nagging cough. No improvement with Robitussin. Exam with clear lungs and normal HENT exam which is reassuring. Does not appear ill. Suspect  viral involvement at this point as he appears well and has a normal exam. Vitals normal. Will treat with supportive measures at this point. RX for Tessalon pearls sent to pharmacy. Flonase, Mucinex PRN. Strict return precautions provided.

## 2015-11-01 IMAGING — CT CT HEAD W/O CM
1 series · 16 of 30 positions shown, 20 images · non-contrast
Comparison: None.

CLINICAL DATA: Frontal headache for 2 months, initial encounter

EXAM:
CT HEAD WITHOUT CONTRAST
TECHNIQUE: Contiguous axial images were obtained from the base of the skull
through the vertex without intravenous contrast.

[Series 2: head_seq -c 4.5 h37s st · axial · 0.44mm/px · z∈[-114,+30]mm · 16 of 36 slices shown, 20 images]
[im 2/36  brain]
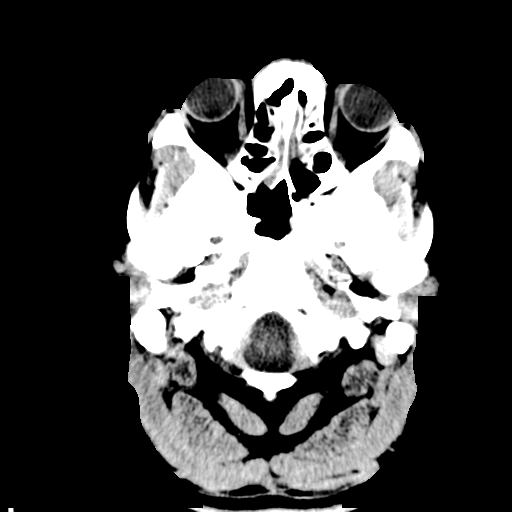
[im 2/36  bone]
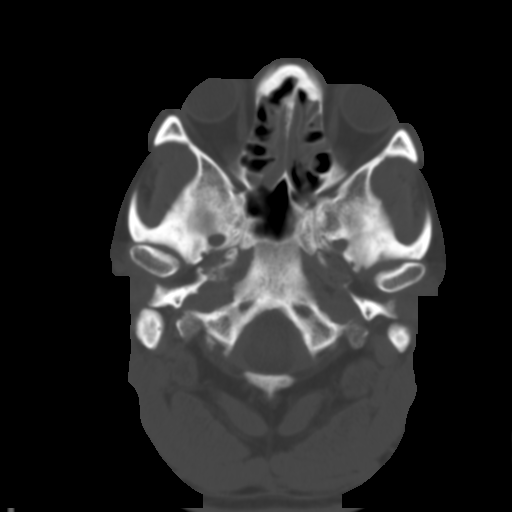
[im 4/36  brain]
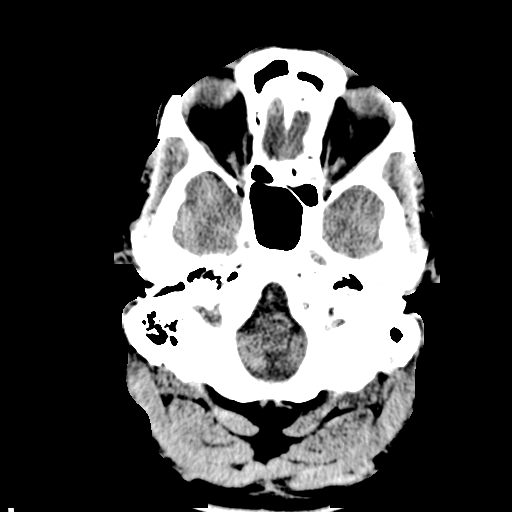
[im 7/36  brain]
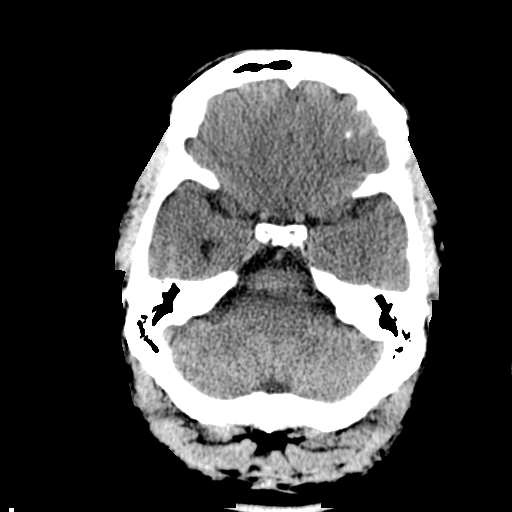
[im 9/36  brain]
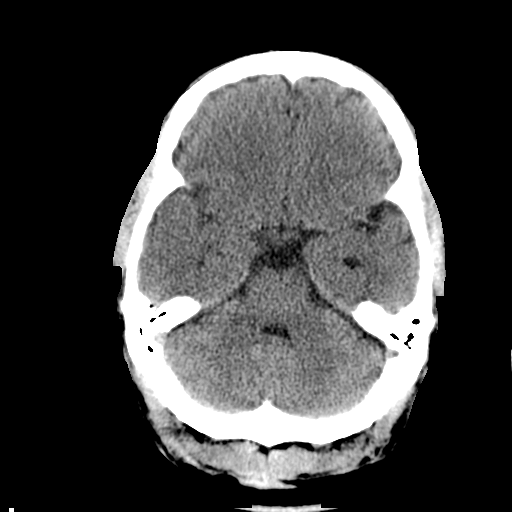
[im 10/36  brain]
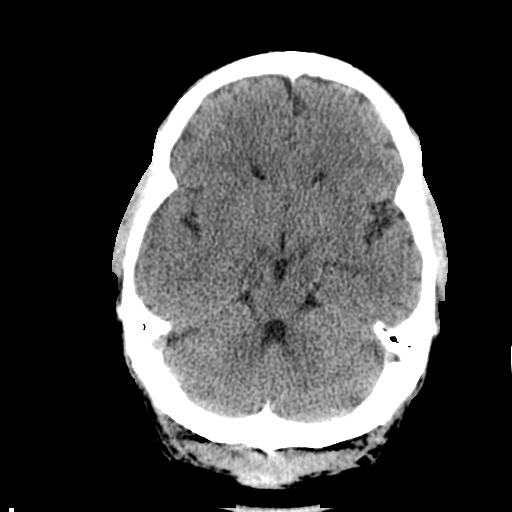
[im 10/36  bone]
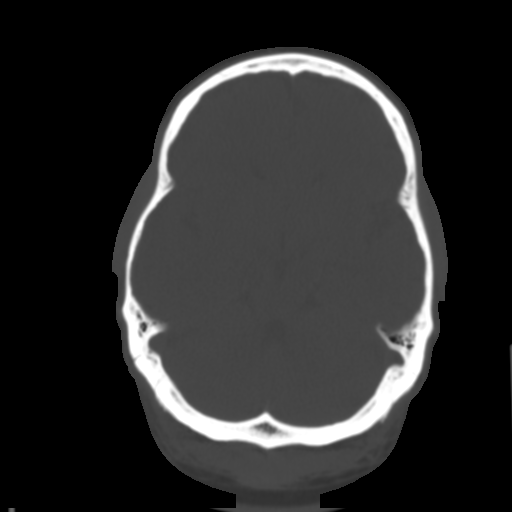
[im 13/36  brain]
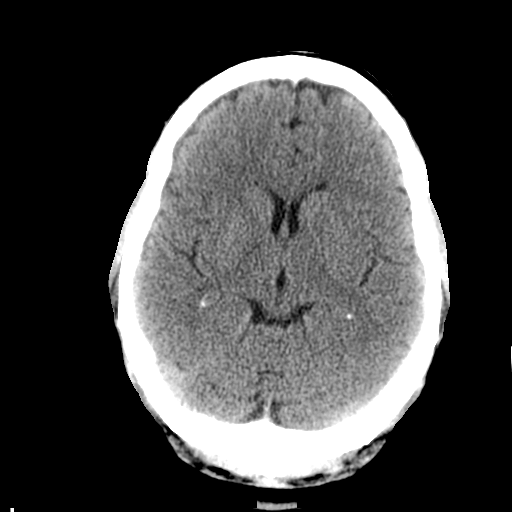
[im 15/36  brain]
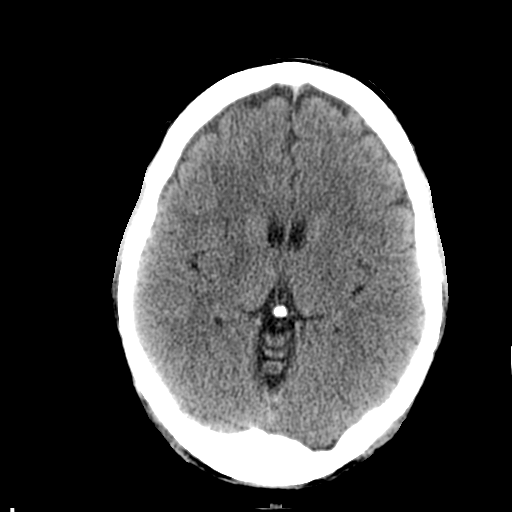
[im 17/36  brain]
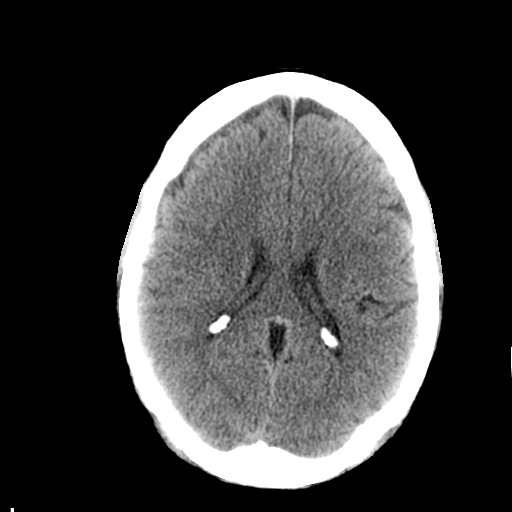
[im 19/36  brain]
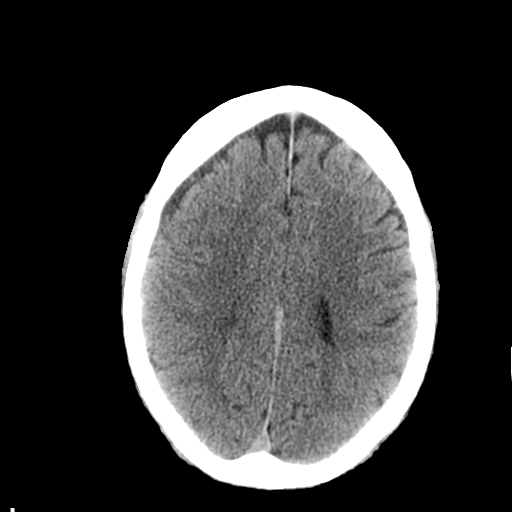
[im 19/36  bone]
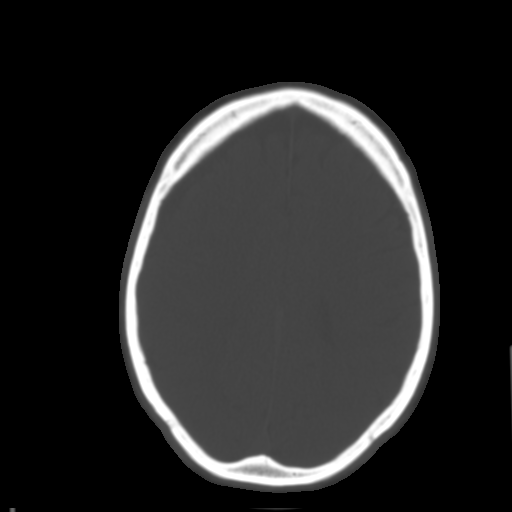
[im 21/36  brain]
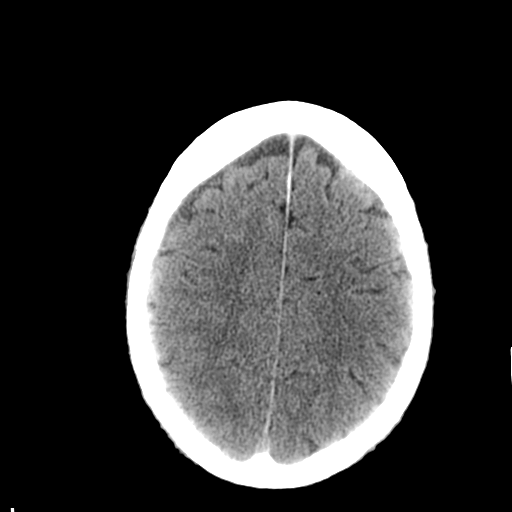
[im 23/36  brain]
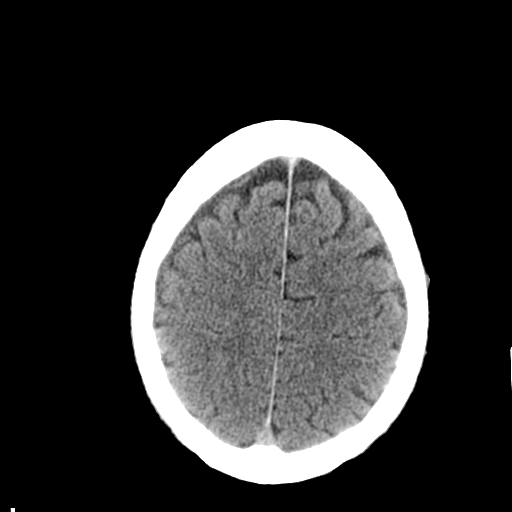
[im 26/36  brain]
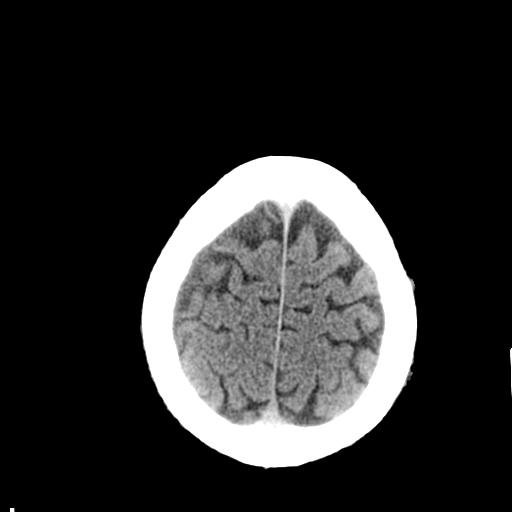
[im 27/36  brain]
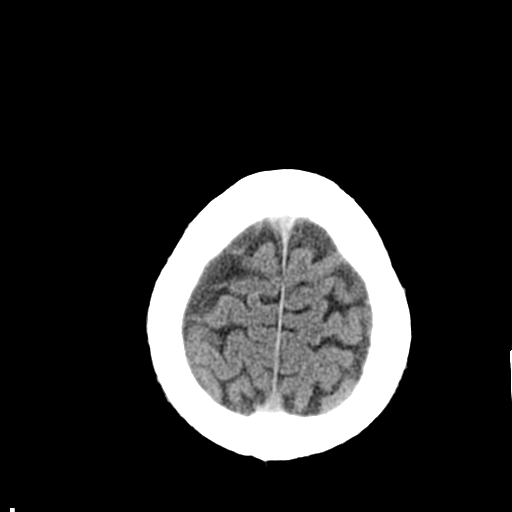
[im 27/36  bone]
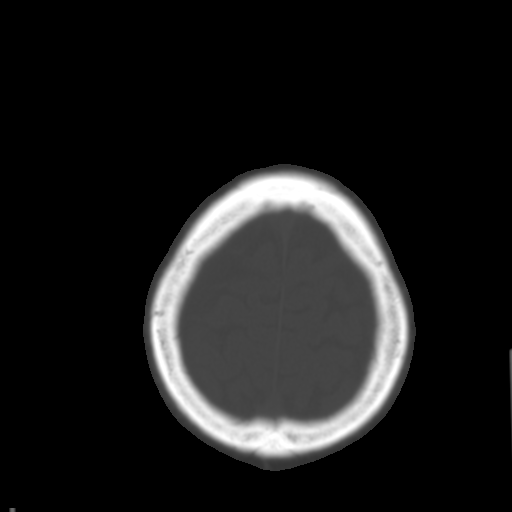
[im 29/36  brain]
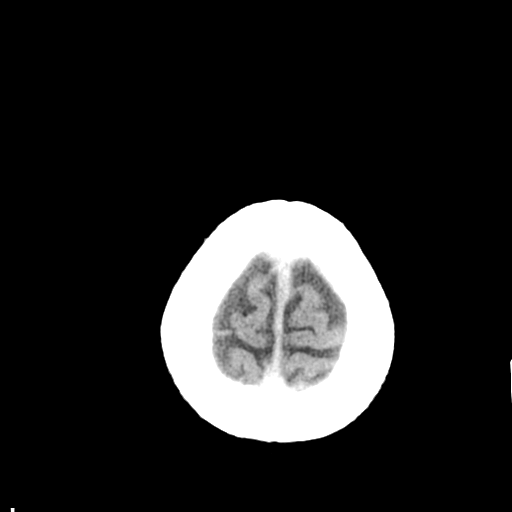
[im 32/36  brain]
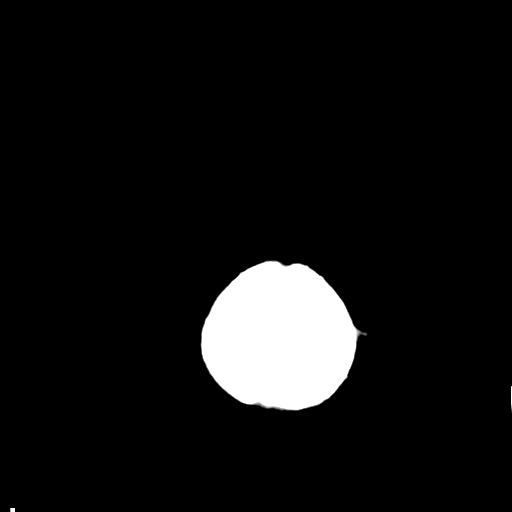
[im 34/36  brain]
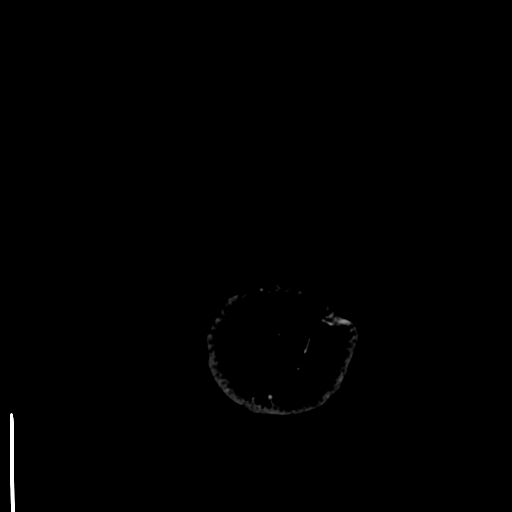

[16 of 30 positions shown; findings below may reference images not displayed]

FINDINGS: The bony calvarium is intact. The mastoid air cells are well
aerated. Some mucosal changes are noted within the ethmoid sinuses
bilaterally. Very mild atrophic changes are seen commensurate with
the patient's given age. No findings to suggest acute hemorrhage,
acute infarction or space-occupying mass lesion are noted. Basal
ganglia calcifications are noted on the right.
IMPRESSION: Mild atrophic changes.  No acute abnormality is noted.

## 2015-12-11 ENCOUNTER — Encounter: Payer: Self-pay | Admitting: Family Medicine

## 2015-12-11 ENCOUNTER — Ambulatory Visit (INDEPENDENT_AMBULATORY_CARE_PROVIDER_SITE_OTHER): Payer: Medicare Other | Admitting: Family Medicine

## 2015-12-11 VITALS — BP 160/76 | HR 67 | Temp 98.1°F | Wt 199.1 lb

## 2015-12-11 DIAGNOSIS — R21 Rash and other nonspecific skin eruption: Secondary | ICD-10-CM | POA: Diagnosis not present

## 2015-12-11 DIAGNOSIS — I10 Essential (primary) hypertension: Secondary | ICD-10-CM

## 2015-12-11 MED ORDER — LISINOPRIL 20 MG PO TABS: 20.0000 mg | ORAL_TABLET | Freq: Every day | ORAL | Status: DC

## 2015-12-11 NOTE — Assessment & Plan Note (Signed)
Chronic, deteriorated. Several elevated readings last few office visits. He did take lisinopril this morning. Will increase lisinopril to 20mg  daily. Recheck labs at f/u visit in 3 months.

## 2015-12-11 NOTE — Patient Instructions (Addendum)
Try cortisone-10 or other over the counter steroid cream for itchy spots. Possible bug bites. Increase lisinopril to 20mg  daily. New dose will be at pharmacy. Return in 3 months for medicare wellness visit

## 2015-12-11 NOTE — Progress Notes (Signed)
Pre visit review using our clinic review tool, if applicable. No additional management support is needed unless otherwise documented below in the visit note. 

## 2015-12-11 NOTE — Assessment & Plan Note (Signed)
Most consistent with bug bites ?flea. rec OTC hydrocortisone cream PRN.

## 2015-12-11 NOTE — Progress Notes (Addendum)
   BP 160/76 mmHg  Pulse 67  Temp(Src) 98.1 F (36.7 C)  Wt 199 lb 1.9 oz (90.32 kg)  SpO2 97%   CC: 6 mo f/u visit  Subjective:    Patient ID: Jeffrey Villarreal, male    DOB: 1941/02/21, 75 y.o.   MRN: [id number]  HPI: Jeffrey BrockGeorge R Fohl is a 75 y.o. male presenting on 12/11/2015 for Follow-up   HTN - Compliant with current antihypertensive regimen of lisinopril 10mg  daily. Does not check blood pressures at home. No low blood pressure readings or symptoms of syncope. Occasional dizziness, mild. Denies HA, vision changes, CP/tightness, SOB, leg swelling.    Few spots on skin itchy. Has been mowing outside. Around neighbor's dog - ?fleas  Relevant past medical, surgical, family and social history reviewed and updated as indicated. Interim medical history since our last visit reviewed. Allergies and medications reviewed and updated. Current Outpatient Prescriptions on File Prior to Visit  Medication Sig  . aspirin EC 81 MG tablet Take 81 mg by mouth daily.  Marland Kitchen. atorvastatin (LIPITOR) 40 MG tablet Take 1 tablet (40 mg total) by mouth daily.   No current facility-administered medications on file prior to visit.    Review of Systems Per HPI unless specifically indicated in ROS section     Objective:    BP 160/76 mmHg  Pulse 67  Temp(Src) 98.1 F (36.7 C)  Wt 199 lb 1.9 oz (90.32 kg)  SpO2 97%  Wt Readings from Last 3 Encounters:  12/11/15 199 lb 1.9 oz (90.32 kg)  09/15/15 206 lb 12.8 oz (93.804 kg)  03/18/15 205 lb 12 oz (93.328 kg)    Physical Exam  Constitutional: He appears well-developed and well-nourished. No distress.  HENT:  Mouth/Throat: Oropharynx is clear and moist. No oropharyngeal exudate.  Cardiovascular: Normal rate, regular rhythm, normal heart sounds and intact distal pulses.   No murmur heard. Pulmonary/Chest: Effort normal and breath sounds normal. No respiratory distress. He has no wheezes. He has no rales.  Musculoskeletal: He exhibits no edema.  Skin:  Skin is warm and dry. Rash noted.  Isolated pruritic papules bilateral lower extremities  Psychiatric: He has a normal mood and affect.  Nursing note and vitals reviewed.     Assessment & Plan:   Problem List Items Addressed This Visit    Hypertension - Primary (Chronic)    Chronic, deteriorated. Several elevated readings last few office visits. He did take lisinopril this morning. Will increase lisinopril to 20mg  daily. Recheck labs at f/u visit in 3 months.      Relevant Medications   lisinopril (PRINIVIL,ZESTRIL) 20 MG tablet   Rash and nonspecific skin eruption    Most consistent with bug bites ?flea. rec OTC hydrocortisone cream PRN.           Follow up plan: Return in about 3 months (around 03/12/2016) for medicare wellness visit.  Eustaquio BoydenJavier Crystalmarie Yasin, MD

## 2016-01-02 ENCOUNTER — Encounter: Payer: Self-pay | Admitting: Family Medicine

## 2016-01-02 ENCOUNTER — Ambulatory Visit (INDEPENDENT_AMBULATORY_CARE_PROVIDER_SITE_OTHER): Payer: Medicare Other | Admitting: Family Medicine

## 2016-01-02 VITALS — BP 158/60 | HR 62 | Temp 97.8°F | Wt 200.8 lb

## 2016-01-02 DIAGNOSIS — M5441 Lumbago with sciatica, right side: Secondary | ICD-10-CM

## 2016-01-02 MED ORDER — MELOXICAM 7.5 MG PO TABS: 7.5000 mg | ORAL_TABLET | Freq: Every day | ORAL | Status: DC

## 2016-01-02 MED ORDER — TIZANIDINE HCL 4 MG PO TABS: 4.0000 mg | ORAL_TABLET | Freq: Every evening | ORAL | Status: DC

## 2016-01-02 NOTE — Progress Notes (Signed)
Dr. Karleen Hampshire T. Taji Sather, MD, CAQ [facility] [address] [place], [address] Phone: [phone] Fax: [phone]  01/02/2016  Patient: Jeffrey Villarreal, Jeffrey Villarreal, Jeffrey Villarreal, Jeffrey Villarreal, 75 y.o.  Primary Physician:  Eustaquio Boyden, MD   Chief Complaint  Patient presents with  . Back Pain    radiating down R leg   Subjective:   Jeffrey Villarreal is a 75 y.o. very pleasant male patient who presents with the following: Back Pain  ongoing for approximately: 1 week The patient has had back pain before. The back pain is localized into the lumbar spine area. They also describe no radiculopathy.  Pain in the R leg and pain on the R side of his back, pain with bending and turning. Laterally and into his leg.   Did Holiday representative, restaurants, and worked at [employer], Nurse, learning disability with food, [employer]. Assembly line.  Hurt back with going to therapy at [facility]   No numbness or tingling. No bowel or bladder incontinence. No focal weakness. Prior interventions: none Physical therapy: No Chiropractic manipulations: No Acupuncture: No Osteopathic manipulation: No Heat or cold: Minimal effect  Past Medical History, Surgical History, Family History, Medications, Allergies have been reviewed and updated if relevant.  Patient Active Problem List   Diagnosis Date Noted  . Rash and nonspecific skin eruption 12/11/2015  . Memory deficit 02/25/2015  . Bilateral hearing loss due to cerumen impaction 02/25/2015  . Syncope 08/19/2014  . Obesity, Class I, BMI 30-34.9 08/16/2014  . Carotid stenosis 03/Villarreal/2016  . Health maintenance examination 02/15/2014  . Decreased hearing of left ear 02/15/2014  . Vision loss, left eye 02/15/2014  . Advanced care planning/counseling discussion 02/15/2014  . Constipation 12/13/2013  . Medicare annual wellness visit, subsequent 09/25/2011  . Hyperlipidemia 09/25/2011  . Prediabetes   . Clubbing of nails  11/17/2010  . Hypertension     Past Medical History:  Diagnosis Date  . Carotid stenosis 08/2014   1-39% bilateral, rpt [place] 2 yrs  . Diet-controlled type 2 diabetes mellitus (HCC)    DSME 02/2013  . History of seizures as a child remote  . Hyperlipidemia   . Hypertension     Past Surgical History:  Procedure Laterality Date  . CATARACT EXTRACTION Right 2013  . COLONOSCOPY  08/2009   large int hemorrhoids, benign lymphoid polyp, rpt due 10 yrs Elnoria Howard)  . DOBUTAMINE STRESS ECHO  2016   WNL Eldridge Dace)    Social History   Social History  . Marital status: Married    Spouse name: N/A  . Number of children: 7  . Years of education: 1th grade   Occupational History  . retired Retired    used to work at [employer], [employer]   Social History Main Topics  . Smoking status: Former Smoker    Quit date: 06/14/1985  . Smokeless tobacco: Never Used  . Alcohol use No     Comment: Quit 1987  . Drug use: No  . Sexual activity: Not on file   Other Topics Concern  . Not on file   Social History Narrative   Caffeine: 1 cup coffee.   Lives with wife, step-daughter (29yo), no pets   Occupation: retired   Activity: walks daily   Diet: good water, fruits/vegetables daily    Family History  Problem Relation Age of Onset  . Hypertension Mother   . Hypertension Father   . Cancer Neg Hx   . Coronary artery disease Neg  Hx   . Stroke Neg Hx   . Diabetes Neg Hx   . Kidney disease Neg Hx   . Hyperlipidemia Neg Hx     No Known Allergies  Medication list reviewed and updated in full in [facility].  GEN: No fevers, chills. Nontoxic. Primarily MSK c/o today. MSK: Detailed in the HPI GI: tolerating PO intake without difficulty Neuro: As above  Otherwise the pertinent positives of the ROS are noted above.    Objective:   Blood pressure (!) 158/60, pulse 62, temperature 97.8 F (36.6 C), temperature source Oral, weight 200 lb 12 oz (91.1 kg), SpO2 97 %.  Gen:  Well-developed,well-nourished,in no acute distress; alert,appropriate and cooperative throughout examination HEENT: Normocephalic and atraumatic without obvious abnormalities.  Ears, externally no deformities Pulm: Breathing comfortably in no respiratory distress Range of motion at  the waist: Flexion, rotation and lateral bending: mild restriction  No echymosis or edema Rises to examination table with no difficulty Gait: minimally antalgic  Inspection/Deformity: No abnormality Paraspinus T:  TTP R >>L l2-s1  B Ankle Dorsiflexion (L5,4): 5/5 B Great Toe Dorsiflexion (L5,4): 5/5 Heel Walk (L5): WNL Toe Walk (S1): WNL Rise/Squat (L4): WNL, mild pain  SENSORY B Medial Foot (L4): WNL B Dorsum (L5): WNL B Lateral (S1): WNL Light Touch: WNL Pinprick: WNL  REFLEXES Knee (L4): 2+ Ankle (S1): 2+  B SLR, seated: neg B SLR, supine: neg B FABER: neg B Reverse FABER: neg B Greater Troch: NT B Log Roll: neg B Stork: NT B Sciatic Notch: NT  Radiology: No results found.  Assessment and Plan:   LBP with R sciatica  Anatomy reviewed. Conservative algorithms for acute back pain generally begin with the following: NSAIDS, Muscle Relaxants, Mild pain medication  Start with medications, core rehab, and progress from there following low back pain algorithm. No red flags are present.  Follow-up: prn  New Prescriptions   MELOXICAM (MOBIC) 7.5 MG TABLET    Take 1 tablet (7.5 mg total) by mouth daily.   TIZANIDINE (ZANAFLEX) 4 MG TABLET    Take 1 tablet (4 mg total) by mouth Nightly.   Signed,  Elpidio GaleaSpencer T. Anuar Walgren, MD   Patient's Medications  New Prescriptions   MELOXICAM (MOBIC) 7.5 MG TABLET    Take 1 tablet (7.5 mg total) by mouth daily.   TIZANIDINE (ZANAFLEX) 4 MG TABLET    Take 1 tablet (4 mg total) by mouth Nightly.  Previous Medications   ASPIRIN EC 81 MG TABLET    Take 81 mg by mouth daily.   ATORVASTATIN (LIPITOR) 40 MG TABLET    Take 1 tablet (40 mg total) by mouth  daily.   LISINOPRIL (PRINIVIL,ZESTRIL) 20 MG TABLET    Take 1 tablet (20 mg total) by mouth daily.  Modified Medications   No medications on file  Discontinued Medications   No medications on file

## 2016-01-02 NOTE — Progress Notes (Signed)
Pre visit review using our clinic review tool, if applicable. No additional management support is needed unless otherwise documented below in the visit note. 

## 2016-03-16 ENCOUNTER — Ambulatory Visit (INDEPENDENT_AMBULATORY_CARE_PROVIDER_SITE_OTHER): Payer: Medicare Other | Admitting: Family Medicine

## 2016-03-16 ENCOUNTER — Encounter: Payer: Self-pay | Admitting: Family Medicine

## 2016-03-16 VITALS — BP 158/84 | HR 68 | Temp 98.3°F | Ht 66.0 in | Wt 201.5 lb

## 2016-03-16 DIAGNOSIS — E785 Hyperlipidemia, unspecified: Secondary | ICD-10-CM

## 2016-03-16 DIAGNOSIS — H5462 Unqualified visual loss, left eye, normal vision right eye: Secondary | ICD-10-CM

## 2016-03-16 DIAGNOSIS — R7303 Prediabetes: Secondary | ICD-10-CM | POA: Diagnosis not present

## 2016-03-16 DIAGNOSIS — Z0000 Encounter for general adult medical examination without abnormal findings: Secondary | ICD-10-CM

## 2016-03-16 DIAGNOSIS — I10 Essential (primary) hypertension: Secondary | ICD-10-CM

## 2016-03-16 DIAGNOSIS — Z7189 Other specified counseling: Secondary | ICD-10-CM

## 2016-03-16 DIAGNOSIS — I6523 Occlusion and stenosis of bilateral carotid arteries: Secondary | ICD-10-CM

## 2016-03-16 DIAGNOSIS — E669 Obesity, unspecified: Secondary | ICD-10-CM

## 2016-03-16 LAB — BASIC METABOLIC PANEL
BUN: 13 mg/dL (ref 6–23)
CALCIUM: 9.2 mg/dL (ref 8.4–10.5)
CO2: 32 mEq/L (ref 19–32)
Chloride: 104 mEq/L (ref 96–112)
Creatinine, Ser: 1.21 mg/dL (ref 0.40–1.50)
GFR: 75.11 mL/min (ref >60.00)
GLUCOSE: 103 mg/dL — AB (ref 70–99)
Potassium: 4 mEq/L (ref 3.5–5.1)
SODIUM: 142 meq/L (ref 135–145)

## 2016-03-16 LAB — LIPID PANEL
CHOLESTEROL: 130 mg/dL (ref 0–200)
HDL: 40.5 mg/dL (ref >39.00)
LDL CALC: 77 mg/dL (ref 0–99)
NONHDL: 89.87
Total CHOL/HDL Ratio: 3
Triglycerides: 65 mg/dL (ref 0.0–149.0)
VLDL: 13 mg/dL (ref 0.0–40.0)

## 2016-03-16 LAB — HEMOGLOBIN A1C: HEMOGLOBIN A1C: 6.4 % (ref 4.6–6.5)

## 2016-03-16 NOTE — Patient Instructions (Addendum)
Labs today.  Consider pneumonia shot and flu shot.  Look at advanced directive packet at home.  Use dilute hydrogen peroxide or mineral oil to clean out wax in ears.   Health Maintenance, Male A healthy lifestyle and preventative care can promote health and wellness.  Maintain regular health, dental, and eye exams.  Eat a healthy diet. Foods like vegetables, fruits, whole grains, low-fat dairy products, and lean protein foods contain the nutrients you need and are low in calories. Decrease your intake of foods high in solid fats, added sugars, and salt. Get information about a proper diet from your health care provider, if necessary.  Regular physical exercise is one of the most important things you can do for your health. Most adults should get at least 150 minutes of moderate-intensity exercise (any activity that increases your heart rate and causes you to sweat) each week. In addition, most adults need muscle-strengthening exercises on 2 or more days a week.   Maintain a healthy weight. The body mass index (BMI) is a screening tool to identify possible weight problems. It provides an estimate of body fat based on height and weight. Your health care provider can find your BMI and can help you achieve or maintain a healthy weight. For males 20 years and older:  A BMI below 18.5 is considered underweight.  A BMI of 18.5 to 24.9 is normal.  A BMI of 25 to 29.9 is considered overweight.  A BMI of 30 and above is considered obese.  Maintain normal blood lipids and cholesterol by exercising and minimizing your intake of saturated fat. Eat a balanced diet with plenty of fruits and vegetables. Blood tests for lipids and cholesterol should begin at age 69 and be repeated every 5 years. If your lipid or cholesterol levels are high, you are over age 48, or you are at high risk for heart disease, you may need your cholesterol levels checked more frequently.Ongoing high lipid and cholesterol levels  should be treated with medicines if diet and exercise are not working.  If you smoke, find out from your health care provider how to quit. If you do not use tobacco, do not start.  Lung cancer screening is recommended for adults aged 55-80 years who are at high risk for developing lung cancer because of a history of smoking. A yearly low-dose CT scan of the lungs is recommended for people who have at least a 30-pack-year history of smoking and are current smokers or have quit within the past 15 years. A pack year of smoking is smoking an average of 1 pack of cigarettes a day for 1 year (for example, a 30-pack-year history of smoking could mean smoking 1 pack a day for 30 years or 2 packs a day for 15 years). Yearly screening should continue until the smoker has stopped smoking for at least 15 years. Yearly screening should be stopped for people who develop a health problem that would prevent them from having lung cancer treatment.  If you choose to drink alcohol, do not have more than 2 drinks per day. One drink is considered to be 12 oz (360 mL) of beer, 5 oz (150 mL) of wine, or 1.5 oz (45 mL) of liquor.  Avoid the use of street drugs. Do not share needles with anyone. Ask for help if you need support or instructions about stopping the use of drugs.  High blood pressure causes heart disease and increases the risk of stroke. High blood pressure is more likely  to develop in:  People who have blood pressure in the end of the normal range (100-139/85-89 mm Hg).  People who are overweight or obese.  People who are African American.  If you are 3518-75 years of age, have your blood pressure checked every 3-5 years. If you are 75 years of age or older, have your blood pressure checked every year. You should have your blood pressure measured twice--once when you are at a hospital or clinic, and once when you are not at a hospital or clinic. Record the average of the two measurements. To check your blood  pressure when you are not at a hospital or clinic, you can use:  An automated blood pressure machine at a pharmacy.  A home blood pressure monitor.  If you are 5045-75 years old, ask your health care provider if you should take aspirin to prevent heart disease.  Diabetes screening involves taking a blood sample to check your fasting blood sugar level. This should be done once every 3 years after age 75 if you are at a normal weight and without risk factors for diabetes. Testing should be considered at a younger age or be carried out more frequently if you are overweight and have at least 1 risk factor for diabetes.  Colorectal cancer can be detected and often prevented. Most routine colorectal cancer screening begins at the age of 75 and continues through age 75. However, your health care provider may recommend screening at an earlier age if you have risk factors for colon cancer. On a yearly basis, your health care provider may provide home test kits to check for hidden blood in the stool. A small camera at the end of a tube may be used to directly examine the colon (sigmoidoscopy or colonoscopy) to detect the earliest forms of colorectal cancer. Talk to your health care provider about this at age 75 when routine screening begins. A direct exam of the colon should be repeated every 5-10 years through age 375, unless early forms of precancerous polyps or small growths are found.  People who are at an increased risk for hepatitis B should be screened for this virus. You are considered at high risk for hepatitis B if:  You were born in a country where hepatitis B occurs often. Talk with your health care provider about which countries are considered high risk.  Your parents were born in a high-risk country and you have not received a shot to protect against hepatitis B (hepatitis B vaccine).  You have HIV or AIDS.  You use needles to inject street drugs.  You live with, or have sex with, someone who  has hepatitis B.  You are a man who has sex with other men (MSM).  You get hemodialysis treatment.  You take certain medicines for conditions like cancer, organ transplantation, and autoimmune conditions.  Hepatitis C blood testing is recommended for all people born from [id number] through 1965 and any individual with known risk factors for hepatitis C.  Healthy men should no longer receive prostate-specific antigen (PSA) blood tests as part of routine cancer screening. Talk to your health care provider about prostate cancer screening.  Testicular cancer screening is not recommended for adolescents or adult males who have no symptoms. Screening includes self-exam, a health care provider exam, and other screening tests. Consult with your health care provider about any symptoms you have or any concerns you have about testicular cancer.  Practice safe sex. Use condoms and avoid high-risk sexual practices to  reduce the spread of sexually transmitted infections (STIs).  You should be screened for STIs, including gonorrhea and chlamydia if:  You are sexually active and are younger than 24 years.  You are older than 24 years, and your health care provider tells you that you are at risk for this type of infection.  Your sexual activity has changed since you were last screened, and you are at an increased risk for chlamydia or gonorrhea. Ask your health care provider if you are at risk.  If you are at risk of being infected with HIV, it is recommended that you take a prescription medicine daily to prevent HIV infection. This is called pre-exposure prophylaxis (PrEP). You are considered at risk if:  You are a man who has sex with other men (MSM).  You are a heterosexual man who is sexually active with multiple partners.  You take drugs by injection.  You are sexually active with a partner who has HIV.  Talk with your health care provider about whether you are at high risk of being infected with  HIV. If you choose to begin PrEP, you should first be tested for HIV. You should then be tested every 3 months for as long as you are taking PrEP.  Use sunscreen. Apply sunscreen liberally and repeatedly throughout the day. You should seek shade when your shadow is shorter than you. Protect yourself by wearing long sleeves, pants, a wide-brimmed hat, and sunglasses year round whenever you are outdoors.  Tell your health care provider of new moles or changes in moles, especially if there is a change in shape or color. Also, tell your health care provider if a mole is larger than the size of a pencil eraser.  A one-time screening for abdominal aortic aneurysm (AAA) and surgical repair of large AAAs by ultrasound is recommended for men aged 65-75 years who are current or former smokers.  Stay current with your vaccines (immunizations).   This information is not intended to replace advice given to you by your health care provider. Make sure you discuss any questions you have with your health care provider.   Document Released: 11/27/2007 Document Revised: 06/21/2014 Document Reviewed: 10/26/2010 Elsevier Interactive Patient Education [employer]

## 2016-03-16 NOTE — Assessment & Plan Note (Signed)
Discussed avoiding added sugars. 

## 2016-03-16 NOTE — Assessment & Plan Note (Signed)
Advanced directives - no directive. Would want wife to be HCPOA, packet provided today.

## 2016-03-16 NOTE — Assessment & Plan Note (Signed)
Not due yet

## 2016-03-16 NOTE — Assessment & Plan Note (Signed)
Preventative protocols reviewed and updated unless pt declined. Discussed healthy diet and lifestyle.  

## 2016-03-16 NOTE — Progress Notes (Signed)
BP (!) 158/84 Comment: No meds this AM  Pulse 68   Temp 98.3 F (36.8 C) (Oral)   Ht 5\' 6"  (1.676 m)   Wt 201 lb 8 oz (91.4 kg)   BMI 32.52 kg/m    CC: medicare wellness visit Subjective:    Patient ID: Jeffrey Villarreal, male    DOB: 28-Feb-1941, 75 y.o.   MRN: [id number]  HPI: Jeffrey BrockGeorge R Villarreal is a 75 y.o. male presenting on 03/16/2016 for Annual Exam   Lisinopril recently increased to 20mg  daily. He did not take BP meds this morning. Endorses good control at home, last check was 2 wks ago but doesn't remember #s, [place] [employer] [place]. Noticing increasing urinary frequency.   Hearing - failed today left. Occasional problems with hearing.  Vision screen - per eye clinic yearly. Failed L eye today. Pending f/u with eye doctor - needs cataract surgery on left.  2+ falls in last year Denies depression, anhedonia.   Preventative: Colonoscopy 08/2009, large int hemorrhoids, benign lymphoid polyp, rpt due 10 yrs Elnoria Howard([place])  Prostate - screening done at [facility] yearly.  Never taken flu shot - declines  Tetanus - unsure, thinks ~2009  Pneumococcal - never received. Discussed reasons to immunize. Currently declines  zostavax - never received. Not currently interested  Advanced directives - no directive. Would want wife to be HCPOA, packet provided today.  Seat belt use discussed Sunscreen use discussed. No changing moles on skin.  Caffeine: 1 cup coffee.  Lives with wife, step-daughter (29yo), no pets  Occupation: retired  Activity: walks daily  Diet: good water, fruits/vegetables daily   Relevant past medical, surgical, family and social history reviewed and updated as indicated. Interim medical history since our last visit reviewed. Allergies and medications reviewed and updated. Current Outpatient Prescriptions on File Prior to Visit  Medication Sig  . aspirin EC 81 MG tablet Take 81 mg by mouth daily.  Marland Kitchen. atorvastatin (LIPITOR) 40 MG tablet Take 1 tablet (40 mg  total) by mouth daily.  Marland Kitchen. lisinopril (PRINIVIL,ZESTRIL) 20 MG tablet Take 1 tablet (20 mg total) by mouth daily.   No current facility-administered medications on file prior to visit.     Review of Systems  Constitutional: Negative for activity change, appetite change, chills, fatigue, fever and unexpected weight change.  HENT: Negative for hearing loss.   Eyes: Negative for visual disturbance.  Respiratory: Negative for cough, chest tightness, shortness of breath and wheezing.   Cardiovascular: Negative for chest pain, palpitations and leg swelling.  Gastrointestinal: Negative for abdominal distention, abdominal pain, blood in stool, constipation, diarrhea, nausea and vomiting.  Genitourinary: Negative for difficulty urinating and hematuria.  Musculoskeletal: Negative for arthralgias, myalgias and neck pain.  Skin: Negative for rash.  Neurological: Negative for dizziness, seizures, syncope and headaches.  Hematological: Negative for adenopathy. Does not bruise/bleed easily.  Psychiatric/Behavioral: Negative for dysphoric mood. The patient is not nervous/anxious.    Per HPI unless specifically indicated in ROS section     Objective:    BP (!) 158/84 Comment: No meds this AM  Pulse 68   Temp 98.3 F (36.8 C) (Oral)   Ht 5\' 6"  (1.676 m)   Wt 201 lb 8 oz (91.4 kg)   BMI 32.52 kg/m   Wt Readings from Last 3 Encounters:  03/16/16 201 lb 8 oz (91.4 kg)  01/02/16 200 lb 12 oz (91.1 kg)  12/11/15 199 lb 1.9 oz (90.3 kg)    Physical Exam  Constitutional: He is  oriented to person, place, and time. He appears well-developed and well-nourished. No distress.  HENT:  Head: Normocephalic and atraumatic.  Right Ear: Hearing, tympanic membrane, external ear and ear canal normal.  Left Ear: Hearing, tympanic membrane, external ear and ear canal normal.  Nose: Nose normal.  Mouth/Throat: Uvula is midline, oropharynx is clear and moist and mucous membranes are normal. No oropharyngeal  exudate, posterior oropharyngeal edema or posterior oropharyngeal erythema.  Eyes: Conjunctivae and EOM are normal. Pupils are equal, round, and reactive to light. No scleral icterus.  Neck: Normal range of motion. Neck supple. Carotid bruit is not present. No thyromegaly present.  Cardiovascular: Normal rate, regular rhythm, normal heart sounds and intact distal pulses.   No murmur heard. Pulses:      Radial pulses are 2+ on the right side, and 2+ on the left side.  Pulmonary/Chest: Effort normal and breath sounds normal. No respiratory distress. He has no wheezes. He has no rales.  Abdominal: Soft. Bowel sounds are normal. He exhibits no distension and no mass. There is no tenderness. There is no rebound and no guarding.  Small umbilical hernia present  Musculoskeletal: Normal range of motion. He exhibits no edema.  Lymphadenopathy:    He has no cervical adenopathy.  Neurological: He is alert and oriented to person, place, and time.  CN grossly intact, station and gait intact Recall 3/3  Calculation 4/5 serial 3s  Skin: Skin is warm and dry. No rash noted.  Psychiatric: He has a normal mood and affect. His behavior is normal. Judgment and thought content normal.  Nursing note and vitals reviewed.  Results for orders placed or performed in visit on 02/20/15  Renal function panel  Result Value Ref Range   Sodium 140 135 - 145 mEq/L   Potassium 4.6 3.5 - 5.1 mEq/L   Chloride 103 96 - 112 mEq/L   CO2 32 19 - 32 mEq/L   Calcium 9.8 8.4 - 10.5 mg/dL   Albumin 4.2 3.5 - 5.2 g/dL   BUN 14 6 - 23 mg/dL   Creatinine, Ser 9.14 0.40 - 1.50 mg/dL   Glucose, Bld 782 (H) 70 - 99 mg/dL   Phosphorus 4.3 2.3 - 4.6 mg/dL   GFR 95.62 >13.08 mL/min  Hemoglobin A1c  Result Value Ref Range   Hgb A1c MFr Bld 6.3 4.6 - 6.5 %  LDL Cholesterol, Direct  Result Value Ref Range   Direct LDL 86.0 mg/dL      Assessment & Plan:   Problem List Items Addressed This Visit    Advanced care  planning/counseling discussion    Advanced directives - no directive. Would want wife to be HCPOA, packet provided today.       Carotid stenosis    Not due yet      Health maintenance examination    Preventative protocols reviewed and updated unless pt declined. Discussed healthy diet and lifestyle.       Hyperlipidemia    Chronic, stable on lipitor. Check FLP today.       Relevant Orders   Lipid panel   Hypertension (Chronic)    Chronic, uncontrolled. Did not take lisinopril this morning, states bp well controlled at home.       Relevant Orders   Basic metabolic panel   Medicare annual wellness visit, subsequent - Primary    I have personally reviewed the Medicare Annual Wellness questionnaire and have noted 1. The patient's medical and social history 2. Their use of alcohol, tobacco or illicit  drugs 3. Their current medications and supplements 4. The patient's functional ability including ADL's, fall risks, home safety risks and hearing or visual impairment. Cognitive function has been assessed and addressed as indicated.  5. Diet and physical activity 6. Evidence for depression or mood disorders The patients weight, height, BMI have been recorded in the chart. I have made referrals, counseling and provided education to the patient based on review of the above and I have provided the pt with a written personalized care plan for preventive services. Provider list updated.. See scanned questionairre as needed for further documentation. Reviewed preventative protocols and updated unless pt declined.       Obesity, Class I, BMI 30-34.9    Continue to encourage healthy diet and lifestyle changes to affect sustainable weight loss.      Prediabetes    Discussed avoiding added sugars.       Relevant Orders   Hemoglobin A1c   Vision loss, left eye    Known cataract       Other Visit Diagnoses   None.      Follow up plan: Return in about 1 year (around 03/16/2017)  for medicare wellness visit.  Eustaquio Boyden, MD

## 2016-03-16 NOTE — Assessment & Plan Note (Signed)

## 2016-03-16 NOTE — Assessment & Plan Note (Signed)
Chronic, uncontrolled. Did not take lisinopril this morning, states bp well controlled at home.

## 2016-03-16 NOTE — Assessment & Plan Note (Signed)
Known cataract

## 2016-03-16 NOTE — Assessment & Plan Note (Signed)
Chronic, stable on lipitor. Check FLP today. 

## 2016-03-16 NOTE — Assessment & Plan Note (Signed)
Continue to encourage healthy diet and lifestyle changes to affect sustainable weight loss.  

## 2016-03-16 NOTE — Progress Notes (Signed)
Pre visit review using our clinic review tool, if applicable. No additional management support is needed unless otherwise documented below in the visit note. 

## 2016-03-22 ENCOUNTER — Encounter: Payer: Self-pay | Admitting: *Deleted

## 2016-04-13 ENCOUNTER — Other Ambulatory Visit: Payer: Self-pay | Admitting: Family Medicine

## 2016-04-20 ENCOUNTER — Encounter: Payer: Self-pay | Admitting: Primary Care

## 2016-04-20 ENCOUNTER — Ambulatory Visit (INDEPENDENT_AMBULATORY_CARE_PROVIDER_SITE_OTHER): Payer: Medicare Other | Admitting: Primary Care

## 2016-04-20 VITALS — BP 146/94 | HR 56 | Temp 97.9°F | Ht 66.0 in | Wt 202.8 lb

## 2016-04-20 DIAGNOSIS — J069 Acute upper respiratory infection, unspecified: Secondary | ICD-10-CM | POA: Diagnosis not present

## 2016-04-20 MED ORDER — BENZONATATE 200 MG PO CAPS: 200.0000 mg | ORAL_CAPSULE | Freq: Three times a day (TID) | ORAL | 0 refills | Status: DC | PRN

## 2016-04-20 NOTE — Patient Instructions (Signed)
Your symptoms are representative of a viral illness which will resolve on its own over time. Our goal is to treat your symptoms in order to aid your body in the healing process and to make you more comfortable.   You may take Benzonatate capsules for cough. Take 1 capsule by mouth three times daily as needed for cough.  Cough/Congestion: Try taking Mucinex DM. This will help loosen up the mucous in your chest. Ensure you take this medication with a full glass of water.  Please notify me if you develop persistent fevers of 101, start coughing up green mucous, notice increased fatigue or weakness, or feel worse.  Increase consumption of water intake and rest.   Upper Respiratory Infection, Adult Most upper respiratory infections (URIs) are a viral infection of the air passages leading to the lungs. A URI affects the nose, throat, and upper air passages. The most common type of URI is nasopharyngitis and is typically referred to as "the common cold." URIs run their course and usually go away on their own. Most of the time, a URI does not require medical attention, but sometimes a bacterial infection in the upper airways can follow a viral infection. This is called a secondary infection. Sinus and middle ear infections are common types of secondary upper respiratory infections. Bacterial pneumonia can also complicate a URI. A URI can worsen asthma and chronic obstructive pulmonary disease (COPD). Sometimes, these complications can require emergency medical care and may be life threatening.  CAUSES Almost all URIs are caused by viruses. A virus is a type of germ and can spread from one person to another.  RISKS FACTORS You may be at risk for a URI if:   You smoke.   You have chronic heart or lung disease.  You have a weakened defense (immune) system.   You are very young or very old.   You have nasal allergies or asthma.  You work in crowded or poorly ventilated areas.  You work in  health care facilities or schools. SIGNS AND SYMPTOMS  Symptoms typically develop 2-3 days after you come in contact with a cold virus. Most viral URIs last 7-10 days. However, viral URIs from the influenza virus (flu virus) can last 14-18 days and are typically more severe. Symptoms may include:   Runny or stuffy (congested) nose.   Sneezing.   Cough.   Sore throat.   Headache.   Fatigue.   Fever.   Loss of appetite.   Pain in your forehead, behind your eyes, and over your cheekbones (sinus pain).  Muscle aches.  DIAGNOSIS  Your health care provider may diagnose a URI by:  Physical exam.  Tests to check that your symptoms are not due to another condition such as:  Strep throat.  Sinusitis.  Pneumonia.  Asthma. TREATMENT  A URI goes away on its own with time. It cannot be cured with medicines, but medicines may be prescribed or recommended to relieve symptoms. Medicines may help:  Reduce your fever.  Reduce your cough.  Relieve nasal congestion. HOME CARE INSTRUCTIONS   Take medicines only as directed by your health care provider.   Gargle warm saltwater or take cough drops to comfort your throat as directed by your health care provider.  Use a warm mist humidifier or inhale steam from a shower to increase air moisture. This may make it easier to breathe.  Drink enough fluid to keep your urine clear or pale yellow.   Eat soups and other clear  broths and maintain good nutrition.   Rest as needed.   Return to work when your temperature has returned to normal or as your health care provider advises. You may need to stay home longer to avoid infecting others. You can also use a face mask and careful hand washing to prevent spread of the virus.  Increase the usage of your inhaler if you have asthma.   Do not use any tobacco products, including cigarettes, chewing tobacco, or electronic cigarettes. If you need help quitting, ask your health care  provider. PREVENTION  The best way to protect yourself from getting a cold is to practice good hygiene.   Avoid oral or hand contact with people with cold symptoms.   Wash your hands often if contact occurs.  There is no clear evidence that vitamin C, vitamin E, echinacea, or exercise reduces the chance of developing a cold. However, it is always recommended to get plenty of rest, exercise, and practice good nutrition.  SEEK MEDICAL CARE IF:   You are getting worse rather than better.   Your symptoms are not controlled by medicine.   You have chills.  You have worsening shortness of breath.  You have brown or red mucus.  You have yellow or brown nasal discharge.  You have pain in your face, especially when you bend forward.  You have a fever.  You have swollen neck glands.  You have pain while swallowing.  You have white areas in the back of your throat. SEEK IMMEDIATE MEDICAL CARE IF:   You have severe or persistent:  Headache.  Ear pain.  Sinus pain.  Chest pain.  You have chronic lung disease and any of the following:  Wheezing.  Prolonged cough.  Coughing up blood.  A change in your usual mucus.  You have a stiff neck.  You have changes in your:  Vision.  Hearing.  Thinking.  Mood. MAKE SURE YOU:   Understand these instructions.  Will watch your condition.  Will get help right away if you are not doing well or get worse.   This information is not intended to replace advice given to you by your health care provider. Make sure you discuss any questions you have with your health care provider.   Document Released: 11/24/2000 Document Revised: 10/15/2014 Document Reviewed: 09/05/2013 Elsevier Interactive Patient Education [employer]2016 Elsevier Inc.

## 2016-04-20 NOTE — Progress Notes (Signed)
Pre visit review using our clinic review tool, if applicable. No additional management support is needed unless otherwise documented below in the visit note. 

## 2016-04-20 NOTE — Progress Notes (Signed)
Subjective:    Patient ID: Jeffrey Villarreal, male    DOB: 1940-07-10, 75 y.o.   MRN: [id number]  HPI  Jeffrey Villarreal is a 75 year old male who presents today with a chief complaint of cough. He also reports nasal congestion, chills. His symptoms began 1 week ago. Overall he's feeling better. He denies shortness of breath, nausea. He's been taking Robitussin and drinking ginger tea. The Robitussin caused dizziness so he stopped taking this. He's been around his wife who's had the same symptoms who is feeling better.  Review of Systems  Constitutional: Negative for chills, fatigue and fever.  HENT: Positive for congestion. Negative for ear pain, sinus pressure and sore throat.   Respiratory: Positive for cough. Negative for shortness of breath.   Cardiovascular: Negative for chest pain.  Gastrointestinal: Negative for nausea.       Past Medical History:  Diagnosis Date  . Carotid stenosis 08/2014   1-39% bilateral, rpt US 2 yrs  . Diet-controlled type 2 diabetes mellitus (HCC)    DSME 02/2013  . History of seizures as a child remote  . Hyperlipidemia   . Hypertension      Social History   Social History  . Marital status: Married    Spouse name: N/A  . Number of children: 7  . Years of education: 1th grade   Occupational History  . retired Retired    used to work at [employer], [employer]   Social History Main Topics  . Smoking status: Former Smoker    Quit date: 06/14/1985  . Smokeless tobacco: Never Used  . Alcohol use No     Comment: Quit 1987  . Drug use: No  . Sexual activity: Not on file   Other Topics Concern  . Not on file   Social History Narrative   Caffeine: 1 cup coffee.   Lives with wife, step-daughter (29yo), no pets   Occupation: retired   Activity: walks daily   Diet: good water, fruits/vegetables daily    Past Surgical History:  Procedure Laterality Date  . CATARACT EXTRACTION Right 2013  . COLONOSCOPY  08/2009   large int hemorrhoids, benign lymphoid  polyp, rpt due 10 yrs Elnoria Howard([place])  . DOBUTAMINE STRESS ECHO  2016   WNL Eldridge Dace([place])    Family History  Problem Relation Age of Onset  . Hypertension Mother   . Hypertension Father   . Cancer Neg Hx   . Coronary artery disease Neg Hx   . Stroke Neg Hx   . Diabetes Neg Hx   . Kidney disease Neg Hx   . Hyperlipidemia Neg Hx     No Known Allergies  Current Outpatient Prescriptions on File Prior to Visit  Medication Sig Dispense Refill  . aspirin EC 81 MG tablet Take 81 mg by mouth daily.    Marland Kitchen. atorvastatin (LIPITOR) 40 MG tablet TAKE ONE TABLET BY MOUTH DAILY 90 tablet 3  . lisinopril (PRINIVIL,ZESTRIL) 20 MG tablet TAKE ONE TABLET BY MOUTH  DAILY 90 tablet 3   No current facility-administered medications on file prior to visit.     BP (!) 146/94   Pulse (!) 56   Temp 97.9 F (36.6 C) (Oral)   Ht 5\' 6"  (1.676 m)   Wt 202 lb 12.8 oz (92 kg)   SpO2 98%   BMI 32.73 kg/m    Objective:   Physical Exam  Constitutional: He appears well-nourished.  HENT:  Right Ear: Tympanic membrane and ear canal normal.  Left  Ear: Tympanic membrane and ear canal normal.  Nose: No mucosal edema. Right sinus exhibits no maxillary sinus tenderness and no frontal sinus tenderness. Left sinus exhibits no maxillary sinus tenderness and no frontal sinus tenderness.  Mouth/Throat: Oropharynx is clear and moist.  Eyes: Conjunctivae are normal.  Neck: Neck supple.  Cardiovascular: Normal rate and regular rhythm.   Pulmonary/Chest: Effort normal and breath sounds normal. He has no wheezes. He has no rales.  Skin: Skin is warm and dry.          Assessment & Plan:  Viral URI:  Cough, congestion, fatigue x 1 week. Overall feeling improved, cough lingering. Managed on ACE. Exam today with clear lungs, does not appear acutely ill. Suspect viral URI that will completely clear on its own. Will treat with supportive measures. Rx for Tessalon pearls sent to pharmacy. Discussed use of mucinex and  hydration. Return precautions provided.  Morrie Sheldonlark,Dazaria Macneill Kendal, NP

## 2016-09-16 DIAGNOSIS — H25812 Combined forms of age-related cataract, left eye: Secondary | ICD-10-CM | POA: Diagnosis not present

## 2016-09-16 DIAGNOSIS — Z961 Presence of intraocular lens: Secondary | ICD-10-CM | POA: Diagnosis not present

## 2016-09-21 DIAGNOSIS — H2512 Age-related nuclear cataract, left eye: Secondary | ICD-10-CM | POA: Diagnosis not present

## 2016-09-27 DIAGNOSIS — H2512 Age-related nuclear cataract, left eye: Secondary | ICD-10-CM | POA: Diagnosis not present

## 2016-09-27 DIAGNOSIS — H25812 Combined forms of age-related cataract, left eye: Secondary | ICD-10-CM | POA: Diagnosis not present

## 2016-09-27 DIAGNOSIS — H21562 Pupillary abnormality, left eye: Secondary | ICD-10-CM | POA: Diagnosis not present

## 2016-11-26 DIAGNOSIS — Z961 Presence of intraocular lens: Secondary | ICD-10-CM | POA: Diagnosis not present

## 2017-03-17 ENCOUNTER — Ambulatory Visit (INDEPENDENT_AMBULATORY_CARE_PROVIDER_SITE_OTHER): Payer: Medicare Other | Admitting: Family Medicine

## 2017-03-17 ENCOUNTER — Encounter: Payer: Self-pay | Admitting: Family Medicine

## 2017-03-17 VITALS — BP 138/70 | HR 58 | Temp 98.1°F | Wt 203.8 lb

## 2017-03-17 DIAGNOSIS — R252 Cramp and spasm: Secondary | ICD-10-CM | POA: Diagnosis not present

## 2017-03-17 LAB — CK: Total CK: 461 U/L — ABNORMAL HIGH (ref 7–232)

## 2017-03-17 LAB — BASIC METABOLIC PANEL
BUN: 11 mg/dL (ref 6–23)
CALCIUM: 9.4 mg/dL (ref 8.4–10.5)
CO2: 29 mEq/L (ref 19–32)
CREATININE: 1.15 mg/dL (ref 0.40–1.50)
Chloride: 102 mEq/L (ref 96–112)
GFR: 79.44 mL/min (ref >60.00)
Glucose, Bld: 98 mg/dL (ref 70–99)
Potassium: 3.4 mEq/L — ABNORMAL LOW (ref 3.5–5.1)
Sodium: 139 mEq/L (ref 135–145)

## 2017-03-17 NOTE — Progress Notes (Signed)
BP 138/70 (BP Location: Left Arm, Patient Position: Sitting, Cuff Size: Normal)   Pulse (!) 58   Temp 98.1 F (36.7 C) (Oral)   Wt 203 lb 12 oz (92.4 kg)   SpO2 97%   BMI 32.89 kg/m    CC: leg spasms Subjective:    Patient ID: Jeffrey Villarreal, male    DOB: [birth year], 76 y.o.   MRN: [id number]  HPI: Jeffrey Villarreal is a 76 y.o. male presenting on 03/17/2017 for Spasms (bilateral LEs. Started about 2 wks)   2 wk h/o leg pains from hips down to lateral knees, worse with walking. Describes cramping. No burning, numbness or paresthesias or weakness of legs. So far has tried tylenol with minimal relief. Rubbing legs helps. Symptoms mostly occur during the day, sometimes at night.   He has been on lipitor for years.  [place]   Relevant past medical, surgical, family and social history reviewed and updated as indicated. Interim medical history since our last visit reviewed. Allergies and medications reviewed and updated. Outpatient Medications Prior to Visit  Medication Sig Dispense Refill  . aspirin EC 81 MG tablet Take 81 mg by mouth daily.    Marland Kitchen atorvastatin (LIPITOR) 40 MG tablet TAKE ONE TABLET BY MOUTH DAILY 90 tablet 3  . lisinopril (PRINIVIL,ZESTRIL) 20 MG tablet TAKE ONE TABLET BY MOUTH  DAILY 90 tablet 3  . benzonatate (TESSALON) 200 MG capsule Take 1 capsule (200 mg total) by mouth 3 (three) times daily as needed for cough. 30 capsule 0   No facility-administered medications prior to visit.      Per HPI unless specifically indicated in ROS section below Review of Systems     Objective:    BP 138/70 (BP Location: Left Arm, Patient Position: Sitting, Cuff Size: Normal)   Pulse (!) 58   Temp 98.1 F (36.7 C) (Oral)   Wt 203 lb 12 oz (92.4 kg)   SpO2 97%   BMI 32.89 kg/m   Wt Readings from Last 3 Encounters:  03/17/17 203 lb 12 oz (92.4 kg)  04/20/16 202 lb 12.8 oz (92 kg)  03/16/16 201 lb 8 oz (91.4 kg)    Physical Exam  Constitutional: He is oriented to person, place, and time. He appears well-developed and well-nourished. No distress.  Musculoskeletal: Normal range of motion. He exhibits no edema.  2+ PT bilaterally, diminished DP bilaterally FROM at ankles, knees, hips No reproducible pain to palpation at legs  Neurological: He is alert and oriented to person, place, and time.  Skin: Skin is warm and dry. No rash noted. No erythema.  Psychiatric: He has a normal mood and affect.  Nursing note and vitals reviewed.  Results for orders placed or performed in visit on 03/16/16  Lipid panel  Result Value Ref Range   Cholesterol 130 0 - 200 mg/dL   Triglycerides 40.9 0.0 - 149.0 mg/dL   HDL 81.19 >14.78 mg/dL   VLDL 29.5 0.0 - 62.1 mg/dL   LDL Cholesterol 77 0 - 99 mg/dL   Total CHOL/HDL Ratio 3    NonHDL 89.87   Basic metabolic panel  Result Value Ref Range   Sodium 142 135 - 145 mEq/L   Potassium 4.0 3.5 - 5.1 mEq/L   Chloride 104 96 - 112 mEq/L   CO2 32 19 - 32 mEq/L   Glucose, Bld 103 (H) 70 - 99 mg/dL   BUN 13 6 - 23  mg/dL   Creatinine, Ser 1.211.610.40 - 1.50 mg/dL   Calcium 9.2 8.4 - 09.6 mg/dL   GFR 04.54 >09.81 mL/min  Hemoglobin A1c  Result Value Ref Range   Hgb A1c MFr Bld 6.4 4.6 - 6.5 %      Assessment & Plan:   Problem List Items Addressed This Visit    Leg cramping - Primary    Benign exam today.  Reviewed supportive care - stretching, good hydration status. ?lipitor related - will hold x 2 wks and update [place] with effect.  Consider less frequent dosing vs lower potency statin.  Check lytes, CPK today.  Pt agrees with plan.       Relevant Orders   Basic metabolic panel   CK       Follow up plan: No Follow-up on file.  Eustaquio Boyden, MD

## 2017-03-17 NOTE — Assessment & Plan Note (Signed)
Benign exam today.  Reviewed supportive care - stretching, good hydration status. ?lipitor related - will hold x 2 wks and update [place] with effect.  Consider less frequent dosing vs lower potency statin.  Check lytes, CPK today.  Pt agrees with plan.

## 2017-03-17 NOTE — Patient Instructions (Signed)
You are having leg cramps.  Labs today to check electrolytes and muscle function.  Make sure to stretch legs every day (morning and night if able).  Let's hold lipitor for 2 weeks then let us know if this resolves the cramping pain.  Make sure you stay well hydrated.   Leg Cramps Leg cramps occur when a muscle or muscles tighten and you have no control over this tightening (involuntary muscle contraction). Muscle cramps can develop in any muscle, but the most common place is in the calf muscles of the leg. Those cramps can occur during exercise or when you are at rest. Leg cramps are painful, and they may last for a few seconds to a few minutes. Cramps may return several times before they finally stop. Usually, leg cramps are not caused by a serious medical problem. In many cases, the cause is not known. Some common causes include:  Overexertion.  Overuse from repetitive motions, or doing the same thing over and over.  Remaining in a certain position for a long period of time.  Improper preparation, form, or technique while performing a sport or an activity.  Dehydration.  Injury.  Side effects of some medicines.  Abnormally low levels of the salts and ions in your blood (electrolytes), especially potassium and calcium. These levels could be low if you are taking water pills (diuretics) or if you are pregnant.  Follow these instructions at home: Watch your condition for any changes. Taking the following actions may help to lessen any discomfort that you are feeling:  Stay well-hydrated. Drink enough fluid to keep your urine clear or pale yellow.  Try massaging, stretching, and relaxing the affected muscle. Do this for several minutes at a time.  For tight or tense muscles, use a warm towel, heating pad, or hot shower water directed to the affected area.  If you are sore or have pain after a cramp, applying ice to the affected area may relieve discomfort. ? Put ice in a plastic  bag. ? Place a towel between your skin and the bag. ? Leave the ice on for 20 minutes, 2-3 times per day.  Avoid strenuous exercise for several days if you have been having frequent leg cramps.  Make sure that your diet includes the essential minerals for your muscles to work normally.  Take medicines only as directed by your health care provider.  Contact a health care provider if:  Your leg cramps get more severe or more frequent, or they do not improve over time.  Your foot becomes cold, numb, or blue. This information is not intended to replace advice given to you by your health care provider. Make sure you discuss any questions you have with your health care provider. Document Released: 07/08/2004 Document Revised: 11/06/2015 Document Reviewed: 05/08/2014 Elsevier Interactive Patient Education  [employer].

## 2017-03-18 ENCOUNTER — Ambulatory Visit: Payer: Medicare Other | Admitting: Family Medicine

## 2017-04-27 ENCOUNTER — Other Ambulatory Visit: Payer: Self-pay | Admitting: Family Medicine

## 2017-08-17 ENCOUNTER — Other Ambulatory Visit: Payer: Self-pay | Admitting: Family Medicine

## 2017-08-23 ENCOUNTER — Ambulatory Visit (INDEPENDENT_AMBULATORY_CARE_PROVIDER_SITE_OTHER): Payer: Medicare Other | Admitting: Family Medicine

## 2017-08-23 ENCOUNTER — Encounter: Payer: Self-pay | Admitting: Family Medicine

## 2017-08-23 VITALS — BP 174/100 | HR 74 | Temp 98.3°F | Wt 210.0 lb

## 2017-08-23 DIAGNOSIS — E785 Hyperlipidemia, unspecified: Secondary | ICD-10-CM | POA: Diagnosis not present

## 2017-08-23 DIAGNOSIS — R0789 Other chest pain: Secondary | ICD-10-CM | POA: Insufficient documentation

## 2017-08-23 DIAGNOSIS — I10 Essential (primary) hypertension: Secondary | ICD-10-CM

## 2017-08-23 MED ORDER — PRAVASTATIN SODIUM 80 MG PO TABS: 80.0000 mg | ORAL_TABLET | Freq: Every day | ORAL | 0 refills | Status: DC

## 2017-08-23 MED ORDER — PRAVASTATIN SODIUM 80 MG PO TABS: 80.0000 mg | ORAL_TABLET | Freq: Every day | ORAL | 3 refills | Status: DC

## 2017-08-23 MED ORDER — LISINOPRIL 20 MG PO TABS: 20.0000 mg | ORAL_TABLET | Freq: Two times a day (BID) | ORAL | 3 refills | Status: DC

## 2017-08-23 NOTE — Assessment & Plan Note (Signed)
He has been off lipitor. CPK mildly elevated when on lipitor, endorsed some myalgias. Will start pravastatin 80mg  in its place and monitory CPK closely. Pt agrees with plan.

## 2017-08-23 NOTE — Progress Notes (Signed)
BP (!) 174/100 (BP Location: Right Arm, Cuff Size: Normal)   Pulse 74   Temp 98.3 F (36.8 C) (Oral)   Wt 210 lb (95.3 kg)   SpO2 97%   BMI 33.89 kg/m   bp 180/110 on recheck  CC: f/u visit Subjective:    Patient ID: Jeffrey Villarreal, male    DOB: 1941-04-29, 77 y.o.   MRN: [id number]  HPI: Jeffrey Villarreal is a 77 y.o. male presenting on 08/23/2017 for Follow-up   Last seen here 03/2017, prior to that 2017. Lipitor stopped 03/2017 due to possible leg cramping - this did not resolve cramping. He has been off statin since.   HTN - compliant with lisinopril 20mg  daily - missed dose today. Does not check blood pressures at home: doesn't have bp cuff. Some dizziness described as weakness (no vertigo or LOC), with frontal headaches noted recently. He also had tight pain upper left chest worse with raising shoulder that came on after lifting 24 pack of water bottles, not improved with rest. Denies vision changes, SOB, leg swelling, nausea or diaphoresis.   Relevant past medical, surgical, family and social history reviewed and updated as indicated. Interim medical history since our last visit reviewed. Allergies and medications reviewed and updated. Outpatient Medications Prior to Visit  Medication Sig Dispense Refill  . aspirin EC 81 MG tablet Take 81 mg by mouth daily.    Marland Kitchen. atorvastatin (LIPITOR) 40 MG tablet TAKE 1 TABLET BY MOUTH ONCE DAILY **NEEDS  APPOINTMENT  FOR  FURTHER  REFILLS** 90 tablet 0  . lisinopril (PRINIVIL,ZESTRIL) 20 MG tablet TAKE ONE TABLET BY MOUTH ONCE DAILY 90 tablet 0   No facility-administered medications prior to visit.      Per HPI unless specifically indicated in ROS section below Review of Systems     Objective:    BP (!) 174/100 (BP Location: Right Arm, Cuff Size: Normal)   Pulse 74   Temp 98.3 F (36.8 C) (Oral)   Wt 210 lb (95.3 kg)   SpO2 97%   BMI 33.89 kg/m   Wt Readings from Last 3 Encounters:  08/23/17 210 lb (95.3 kg)  03/17/17 203 lb  12 oz (92.4 kg)  04/20/16 202 lb 12.8 oz (92 kg)    Physical Exam  Constitutional: He appears well-developed and well-nourished. No distress.  HENT:  Head: Normocephalic and atraumatic.  Mouth/Throat: Oropharynx is clear and moist. No oropharyngeal exudate.  Eyes: Conjunctivae and EOM are normal. Pupils are equal, round, and reactive to light. No scleral icterus.  Neck: Normal range of motion. Neck supple. No thyromegaly present.  Cardiovascular: Normal rate, regular rhythm, normal heart sounds and intact distal pulses.  No murmur heard. Pulmonary/Chest: Effort normal and breath sounds normal. No respiratory distress. He has no wheezes. He has no rales.  Skin: Skin is warm and dry. No rash noted.  Vitals reviewed.  Results for orders placed or performed in visit on 03/17/17  Basic metabolic panel  Result Value Ref Range   Sodium 139 135 - 145 mEq/L   Potassium 3.4 (L) 3.5 - 5.1 mEq/L   Chloride 102 96 - 112 mEq/L   CO2 29 19 - 32 mEq/L   Glucose, Bld 98 70 - 99 mg/dL   BUN 11 6 - 23 mg/dL   Creatinine, Ser 0.861.15 0.40 - 1.50 mg/dL   Calcium 9.4 8.4 - 57.810.5 mg/dL   GFR 46.9679.44 >29.52>60.00 mL/min  CK  Result Value Ref Range   Total CK 461 (  H) 7 - 232 U/L   EKG - NSR rate 60s, LAD with LAFB, normal intervals, no acute ST/T changes, overall improved R wave progression from prior.     Assessment & Plan:   Problem List Items Addressed This Visit    Chest discomfort    Describes L sided chest discomfort associated with heavy lifting over the weekend, not currently. EKG reassuring. Restart statin, continue aspirin, will work towards better BP control and reassess. Low threshold to refer to cardiology if reoccurs - he will let me know right away or seek urgent care.      Hyperlipidemia    He has been off lipitor. CPK mildly elevated when on lipitor, endorsed some myalgias. Will start pravastatin 80mg  in its place and monitory CPK closely. Pt agrees with plan.       Relevant Medications    pravastatin (PRAVACHOL) 80 MG tablet   lisinopril (PRINIVIL,ZESTRIL) 20 MG tablet   Hypertension - Primary (Chronic)    Chronic, deteriorated. He did not take lisinopril this morning. Advised take as soon as he gets home, and then start taking BID dosing. He will monitor bp at local pharmacy or buy bp cuff and notify me of readings over next 1-2 weeks. Reassess at CPE in 1 month.       Relevant Medications   pravastatin (PRAVACHOL) 80 MG tablet   lisinopril (PRINIVIL,ZESTRIL) 20 MG tablet   Other Relevant Orders   EKG 12-Lead (Completed)       Meds ordered this encounter  Medications  . DISCONTD: pravastatin (PRAVACHOL) 80 MG tablet    Sig: Take 1 tablet (80 mg total) by mouth daily.    Dispense:  90 tablet    Refill:  0  . pravastatin (PRAVACHOL) 80 MG tablet    Sig: Take 1 tablet (80 mg total) by mouth daily.    Dispense:  90 tablet    Refill:  3    In place of lipitor  . lisinopril (PRINIVIL,ZESTRIL) 20 MG tablet    Sig: Take 1 tablet (20 mg total) by mouth 2 (two) times daily.    Dispense:  180 tablet    Refill:  3    Needs appointment for further refills   Orders Placed This Encounter  Procedures  . EKG 12-Lead    Follow up plan: Return in about 4 weeks (around 09/20/2017) for annual exam, prior fasting for blood work.  Eustaquio Boyden, MD

## 2017-08-23 NOTE — Assessment & Plan Note (Signed)
Describes L sided chest discomfort associated with heavy lifting over the weekend, not currently. EKG reassuring. Restart statin, continue aspirin, will work towards better BP control and reassess. Low threshold to refer to cardiology if reoccurs - he will let me know right away or seek urgent care.

## 2017-08-23 NOTE — Patient Instructions (Addendum)
Take blood pressure medicine (lisinopril) when you get home. increase this to twice daily. Start monitoring blood pressures at local pharmacy a few times a week if able and let me know readings after 1-2 weeks.  Start pravastatin - in place of lipitor.  Return for physical in 1 month.

## 2017-08-23 NOTE — Assessment & Plan Note (Signed)
Chronic, deteriorated. He did not take lisinopril this morning. Advised take as soon as he gets home, and then start taking BID dosing. He will monitor bp at local pharmacy or buy bp cuff and notify me of readings over next 1-2 weeks. Reassess at CPE in 1 month.

## 2017-08-29 ENCOUNTER — Ambulatory Visit: Payer: Self-pay

## 2017-08-29 NOTE — Telephone Encounter (Signed)
Pt called with elevated systolic BP and mild headache. Pt took BP before taking his BP med. He was rx to take BP med twice a day 08/23/17 but instead was taking his cholesterol med bid instead. Denies weakness and numbness on either side of the body and no chest pain. Pt states "I feel pretty good this am except for this headache." Will callback pt at 1100 to check BP and check on headache. Reason for Disposition . [1] Systolic BP  >= 140 OR Diastolic >= 90 AND [2] not taking BP medications    Was not taking as Rx until Sunday  Answer Assessment - Initial Assessment Questions 1. BLOOD PRESSURE: "What is the blood pressure?" "Did you take at least two measurements 5 minutes apart?"     17 0/? This am  174/92 last night Had not taken med this am  2. ONSET: "When did you take your blood pressure?"     [facility] [place] [facility] dept[facility] 3. HOW: "How did you obtain the blood pressure?" (e.g., visiting nurse, automatic home BP monitor)     Taken at the fire dept 4. HISTORY: "Do you have a history of high blood pressure?"     yes 5. MEDICATIONS: "Are you taking any medications for blood pressure?" "Have you missed any doses recently?"     Yes-did not take dose as prescribed. Sunday was the 1st day he took his med twice a day 6. OTHER SYMPTOMS: "Do you have any symptoms?" (e.g., headache, chest pain, blurred vision, difficulty breathing, weakness)     Headache mild intermittent 4/10 7. PREGNANCY: "Is there any chance you are pregnant?" "When was your last menstrual period?"     n/a  Protocols used: HIGH BLOOD PRESSURE-A-AH

## 2017-08-29 NOTE — Telephone Encounter (Signed)
Pt's wife called back and stated that the patient's  b/p is 183/83. He states headache now is mild, no nausea or vomiting, no sweating and no pain on either side of the body. He states that he had not been taking his b/p meds as prescribed. But now has started taking them twice a day.

## 2017-08-29 NOTE — Telephone Encounter (Signed)
Spoke with pt relaying message per Dr. G. Pt verbalizes understanding. 

## 2017-08-29 NOTE — Telephone Encounter (Signed)
I'm glad he's now taking lisinopril 20mg  bid.  Recommend he call us at the end of the week with bp readings on BID dosing.  Let us know sooner if any worsening or recurrent chest pain.

## 2017-08-29 NOTE — Telephone Encounter (Signed)
Called pt back. Pt has to go to a fire station that is near his house to get BP. Pt to call back when he has a BP result and if his headache is any better.

## 2017-08-30 ENCOUNTER — Emergency Department (HOSPITAL_COMMUNITY)
Admission: EM | Admit: 2017-08-30 | Discharge: 2017-08-30 | Discharge status: Against Medical Advice | Payer: Medicare Other | Attending: Emergency Medicine | Admitting: Emergency Medicine

## 2017-08-30 ENCOUNTER — Encounter (HOSPITAL_COMMUNITY): Payer: Self-pay | Admitting: Emergency Medicine

## 2017-08-30 DIAGNOSIS — I10 Essential (primary) hypertension: Secondary | ICD-10-CM | POA: Diagnosis present

## 2017-08-30 DIAGNOSIS — Z5321 Procedure and treatment not carried out due to patient leaving prior to being seen by health care provider: Secondary | ICD-10-CM | POA: Insufficient documentation

## 2017-08-30 NOTE — ED Notes (Signed)
Patient called x2 no answer. 

## 2017-08-30 NOTE — ED Triage Notes (Signed)
Patient here from home with complaints of hypertension. Reports that he takes blood pressure meds at morning and evening. Last dose yesterday evening. States that bp fluctuates.

## 2017-09-02 ENCOUNTER — Ambulatory Visit: Payer: Self-pay

## 2017-09-02 ENCOUNTER — Encounter: Payer: Self-pay | Admitting: Family Medicine

## 2017-09-02 ENCOUNTER — Ambulatory Visit (INDEPENDENT_AMBULATORY_CARE_PROVIDER_SITE_OTHER): Payer: Medicare Other | Admitting: Family Medicine

## 2017-09-02 VITALS — BP 160/84 | HR 71 | Temp 98.0°F | Wt 206.0 lb

## 2017-09-02 DIAGNOSIS — K5900 Constipation, unspecified: Secondary | ICD-10-CM

## 2017-09-02 DIAGNOSIS — H5702 Anisocoria: Secondary | ICD-10-CM | POA: Insufficient documentation

## 2017-09-02 DIAGNOSIS — I10 Essential (primary) hypertension: Secondary | ICD-10-CM | POA: Diagnosis not present

## 2017-09-02 LAB — MICROALBUMIN / CREATININE URINE RATIO
Creatinine,U: 82.5 mg/dL
MICROALB/CREAT RATIO: 0.8 mg/g (ref 0.0–30.0)

## 2017-09-02 LAB — BASIC METABOLIC PANEL
BUN: 12 mg/dL (ref 6–23)
CHLORIDE: 100 meq/L (ref 96–112)
CO2: 27 mEq/L (ref 19–32)
Calcium: 9.5 mg/dL (ref 8.4–10.5)
Creatinine, Ser: 1.26 mg/dL (ref 0.40–1.50)
GFR: 71.4 mL/min (ref >60.00)
GLUCOSE: 99 mg/dL (ref 70–99)
POTASSIUM: 4.6 meq/L (ref 3.5–5.1)
SODIUM: 137 meq/L (ref 135–145)

## 2017-09-02 LAB — TSH: TSH: 2.03 u[IU]/mL (ref 0.35–4.50)

## 2017-09-02 MED ORDER — LISINOPRIL 20 MG PO TABS: 20.0000 mg | ORAL_TABLET | Freq: Every day | ORAL | 3 refills | Status: DC

## 2017-09-02 MED ORDER — POLYETHYLENE GLYCOL 3350 17 GM/SCOOP PO POWD: 17.0000 g | Freq: Every day | ORAL | 1 refills | Status: AC | PRN

## 2017-09-02 MED ORDER — AMLODIPINE BESYLATE 10 MG PO TABS: 10.0000 mg | ORAL_TABLET | Freq: Every day | ORAL | 3 refills | Status: DC

## 2017-09-02 NOTE — Patient Instructions (Addendum)
Labs and urine check today May take miralax 1/2-1 capful daily for constipation.  Decrease lisinopril back to 1 tablet daily (20mg ). Start amlodipine 10mg  daily.  Schedule eye doctor appointment.  If you can, buy blood pressure cuff to check with at home. Update me with readings in 4-5 days.  Keep follow up 4/2.

## 2017-09-02 NOTE — Assessment & Plan Note (Addendum)
Newly noticed today. ?sequela after cataract surgery. Advised pt schedule eye doctor appt as he's due. No neurological symptoms today or endorsed besides intermittent mild headache.

## 2017-09-02 NOTE — Telephone Encounter (Signed)
Pt calling to report his BP trends and todays BP. He has been trending 190-200 systolic over 100-110 diastolic. Having no sx other than a mild headache. Disposition is to go to ED but Ina KickSkyped Rena to see if pt can be worked in. Rena skyped that Dr Reece AgarG can see pt 1230. Spoke with wife Mora BellmanRuby who states they can make the appt. Informed them to come in to office at 1215 to get checked in.  Reason for Disposition . [1] Systolic BP  >= 160 OR Diastolic >= 100 AND [2] cardiac or neurologic symptoms (e.g., chest pain, difficulty breathing, unsteady gait, blurred vision)  Answer Assessment - Initial Assessment Questions 1. BLOOD PRESSURE: "What is the blood pressure?" "Did you take at least two measurements 5 minutes apart?"     214/100 2. ONSET: "When did you take your blood pressure?"     This am  3. HOW: "How did you obtain the blood pressure?" (e.g., visiting nurse, automatic home BP monitor)     Manual cuff pressure 4. HISTORY: "Do you have a history of high blood pressure?"     yes 5. MEDICATIONS: "Are you taking any medications for blood pressure?" "Have you missed any doses recently?"     Yes- not since Tuesday 6. OTHER SYMPTOMS: "Do you have any symptoms?" (e.g., headache, chest pain, blurred vision, difficulty breathing, weakness)     Mild headache only 7. PREGNANCY: "Is there any chance you are pregnant?" "When was your last menstrual period?"     n/a  Protocols used: HIGH BLOOD PRESSURE-A-AH

## 2017-09-02 NOTE — Progress Notes (Signed)
BP (!) 160/84 (BP Location: Right Arm, Cuff Size: Normal)   Pulse 71   Temp 98 F (36.7 C) (Oral)   Wt 206 lb (93.4 kg)   SpO2 97%   BMI 33.25 kg/m    CC: elevated blood pressures Subjective:    Patient ID: Jeffrey Villarreal, male    DOB: 01-06-1941, 77 y.o.   MRN: [id number]  HPI: Jeffrey Villarreal is a 77 y.o. male presenting on 09/02/2017 for Elevated blood pressure (Pt had BP checked at fire station this morning and was 214/100.  Had a minor HA earlier today but did not last long.  Pt provided recent BP readings. )   See prior note for details. Last visit we increased lisinopril to 20mg  BID. This hasn't helped.  Went to ER 3/19 - but left without being seen.  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 Large fluctuations 127-214/72-107.  Mild headache, not currently.  Denies vision changes, CP/tightness, SOB, leg swelling.    Denies changes in diet recently. Avoiding salt/sodium in the diet. Good water. Avoids canned foods, pork bbq or ham.   Increasing constipation noted. Requests medication for this.   DOBUTAMINE STRESS ECHO 2016 WNL Eldridge Dace)  Relevant past medical, surgical, family and social history reviewed and updated as indicated. Interim medical history since our last visit reviewed. Allergies and medications reviewed and updated. Outpatient Medications Prior to Visit  Medication Sig Dispense Refill  . aspirin EC 81 MG tablet Take 81 mg by mouth daily.    . pravastatin (PRAVACHOL) 80 MG tablet Take 1 tablet (80 mg total) by mouth daily. 90 tablet 3  . lisinopril (PRINIVIL,ZESTRIL) 20 MG tablet Take 1 tablet (20 mg total) by mouth 2 (two) times daily. 180 tablet 3   No facility-administered medications prior to visit.      Per HPI unless specifically indicated in ROS section below Review of Systems     Objective:    BP (!) 160/84 (BP Location: Right Arm, Cuff Size: Normal)   Pulse 71   Temp 98 F (36.7 C) (Oral)   Wt 206 lb (93.4 kg)   SpO2  97%   BMI 33.25 kg/m   Wt Readings from Last 3 Encounters:  09/02/17 206 lb (93.4 kg)  08/23/17 210 lb (95.3 kg)  03/17/17 203 lb 12 oz (92.4 kg)    Physical Exam  Constitutional: He appears well-developed. No distress.  HENT:  Mouth/Throat: Oropharynx is clear and moist. No oropharyngeal exudate.  Eyes: Conjunctivae and EOM are normal.  Anisocoria - L pupil dilated more than right Pupils reactive to light S/p bilateral cataract extraction  Cardiovascular: Normal rate, regular rhythm, normal heart sounds and intact distal pulses.  No murmur heard. Pulmonary/Chest: Effort normal and breath sounds normal. No respiratory distress. He has no wheezes. He has no rales.  Musculoskeletal: He exhibits no edema.  Psychiatric: He has a normal mood and affect.  Nursing note and vitals reviewed.  Results for orders placed or performed in visit on 03/17/17  Basic metabolic panel  Result Value Ref Range   Sodium 139 135 - 145 mEq/L   Potassium 3.4 (L) 3.5 - 5.1 mEq/L   Chloride 102 96 - 112 mEq/L   CO2 29 19 - 32 mEq/L   Glucose, Bld 98 70 - 99 mg/dL   BUN 11 6 - 23 mg/dL   Creatinine, Ser 4.09 0.40 - 1.50 mg/dL   Calcium 9.4 8.4 - 81.1 mg/dL   GFR 91.47 >  60.00 mL/min  CK  Result Value Ref Range   Total CK 461 (H) 7 - 232 U/L      Assessment & Plan:   Problem List Items Addressed This Visit    Anisocoria    Newly noticed today. ?sequela after cataract surgery. Advised pt schedule eye doctor appt as he's due. No neurological symptoms today or endorsed besides intermittent mild headache.       Constipation    Encouraged increased water, fiber in diet.  Rx miralax 1/2-1 capful daily PRN constipation.       Hypertension - Primary (Chronic)    Chronic, persistently elevated despite increasing lisinopril to bid - will decrease back to QD dosing, will start amlodipine 10mg  daily. Discussed monitoring for ankle swelling.  Check labs today.  Keep close f/u in 10 days.       Relevant  Medications   lisinopril (PRINIVIL,ZESTRIL) 20 MG tablet   amLODipine (NORVASC) 10 MG tablet   Other Relevant Orders   TSH   Basic metabolic panel   Microalbumin / creatinine urine ratio       Meds ordered this encounter  Medications  . polyethylene glycol powder (GLYCOLAX/MIRALAX) powder    Sig: Take 17 g by mouth daily as needed for moderate constipation.    Dispense:  850 g    Refill:  1  . lisinopril (PRINIVIL,ZESTRIL) 20 MG tablet    Sig: Take 1 tablet (20 mg total) by mouth daily.    Dispense:  90 tablet    Refill:  3    Note new sig  . amLODipine (NORVASC) 10 MG tablet    Sig: Take 1 tablet (10 mg total) by mouth daily.    Dispense:  90 tablet    Refill:  3   Orders Placed This Encounter  Procedures  . TSH  . Basic metabolic panel  . Microalbumin / creatinine urine ratio    Follow up plan: Return if symptoms worsen or fail to improve.  Jeffrey BoydenJavier Roarke Marciano, MD

## 2017-09-02 NOTE — Assessment & Plan Note (Addendum)
Chronic, persistently elevated despite increasing lisinopril to bid - will decrease back to QD dosing, will start amlodipine 10mg  daily. Discussed monitoring for ankle swelling.  Check labs today.  Keep close f/u in 10 days.

## 2017-09-02 NOTE — Assessment & Plan Note (Signed)
Encouraged increased water, fiber in diet.  Rx miralax 1/2-1 capful daily PRN constipation.

## 2017-09-14 ENCOUNTER — Encounter: Payer: Self-pay | Admitting: Family Medicine

## 2017-09-14 ENCOUNTER — Ambulatory Visit (INDEPENDENT_AMBULATORY_CARE_PROVIDER_SITE_OTHER): Payer: Medicare Other | Admitting: Family Medicine

## 2017-09-14 VITALS — BP 152/78 | HR 65 | Temp 98.0°F | Ht 65.5 in | Wt 202.5 lb

## 2017-09-14 DIAGNOSIS — E785 Hyperlipidemia, unspecified: Secondary | ICD-10-CM

## 2017-09-14 DIAGNOSIS — I10 Essential (primary) hypertension: Secondary | ICD-10-CM

## 2017-09-14 DIAGNOSIS — Z0000 Encounter for general adult medical examination without abnormal findings: Secondary | ICD-10-CM

## 2017-09-14 DIAGNOSIS — Z7189 Other specified counseling: Secondary | ICD-10-CM

## 2017-09-14 DIAGNOSIS — R7303 Prediabetes: Secondary | ICD-10-CM

## 2017-09-14 DIAGNOSIS — I6523 Occlusion and stenosis of bilateral carotid arteries: Secondary | ICD-10-CM

## 2017-09-14 DIAGNOSIS — R413 Other amnesia: Secondary | ICD-10-CM

## 2017-09-14 DIAGNOSIS — E669 Obesity, unspecified: Secondary | ICD-10-CM

## 2017-09-14 LAB — HEMOGLOBIN A1C: HEMOGLOBIN A1C: 6.8 % — AB (ref 4.6–6.5)

## 2017-09-14 LAB — LIPID PANEL
CHOL/HDL RATIO: 3
Cholesterol: 137 mg/dL (ref 0–200)
HDL: 41.4 mg/dL (ref >39.00)
LDL Cholesterol: 80 mg/dL (ref 0–99)
NONHDL: 95.89
Triglycerides: 80 mg/dL (ref 0.0–149.0)
VLDL: 16 mg/dL (ref 0.0–40.0)

## 2017-09-14 NOTE — Assessment & Plan Note (Addendum)
Chronic, stable on pravastatin 80. Consider stronger statin.  The 10-year ASCVD risk score Denman Malikiah([place]off [place] Montez HagemanJr., et al., 2013) is: 47.3%   Values used to calculate the score:     Age: [age] years     Sex: Male     Is Non-Hispanic African American: Yes     Diabetic: Yes     Tobacco smoker: No     Systolic Blood Pressure: 152 mmHg     Is BP treated: Yes     HDL Cholesterol: 40.5 mg/dL     Total Cholesterol: 130 mg/dL

## 2017-09-14 NOTE — Assessment & Plan Note (Addendum)
Failed calculation today - anticipate related to educational level rather than true memory trouble.

## 2017-09-14 NOTE — Assessment & Plan Note (Signed)
Advanced directives - no directive yet. Would want wife to be HCPOA. Has packet at home.

## 2017-09-14 NOTE — Assessment & Plan Note (Signed)
Update carotid US.  

## 2017-09-14 NOTE — Assessment & Plan Note (Addendum)
Chronic, improving with addition of amlodipine - continue this. He now has BP cuff at home. I asked him to monitor BP and bring log for me to review in 2 wks. He requests sooner f/u so will return in 1 month for f/u visit.

## 2017-09-14 NOTE — Assessment & Plan Note (Signed)
Reviewed healthy diet and lifestyle changes to affect sustainable weight loss.  

## 2017-09-14 NOTE — Assessment & Plan Note (Signed)
Chronic, update A1c today.

## 2017-09-14 NOTE — Patient Instructions (Addendum)
I'm glad you now have a BP cuff. Keep checking, jot down readings and bring me a log in 2 weeks to review.  Work on Scientist, water qualityadvanced directive.  Labs today.  Sign release for records from urologist.  We will set you up for carotid artery ultrasound.  Return in 2-3 months for follow up visit.   Health Maintenance, Male A healthy lifestyle and preventive care is important for your health and wellness. Ask your health care provider about what schedule of regular examinations is right for you. What should I know about weight and diet? Eat a Healthy Diet  Eat plenty of vegetables, fruits, whole grains, low-fat dairy products, and lean protein.  Do not eat a lot of foods high in solid fats, added sugars, or salt.  Maintain a Healthy Weight Regular exercise can help you achieve or maintain a healthy weight. You should:  Do at least 150 minutes of exercise each week. The exercise should increase your heart rate and make you sweat (moderate-intensity exercise).  Do strength-training exercises at least twice a week.  Watch Your Levels of Cholesterol and Blood Lipids  Have your blood tested for lipids and cholesterol every 5 years starting at 77 years of age. If you are at high risk for heart disease, you should start having your blood tested when you are 77 years old. You may need to have your cholesterol levels checked more often if: ? Your lipid or cholesterol levels are high. ? You are older than 77 years of age. ? You are at high risk for heart disease.  What should I know about cancer screening? Many types of cancers can be detected early and may often be prevented. Lung Cancer  You should be screened every year for lung cancer if: ? You are a current smoker who has smoked for at least 30 years. ? You are a former smoker who has quit within the past 15 years.  Talk to your health care provider about your screening options, when you should start screening, and how often you should be  screened.  Colorectal Cancer  Routine colorectal cancer screening usually begins at 77 years of age and should be repeated every 5-10 years until you are 77 years old. You may need to be screened more often if early forms of precancerous polyps or small growths are found. Your health care provider may recommend screening at an earlier age if you have risk factors for colon cancer.  Your health care provider may recommend using home test kits to check for hidden blood in the stool.  A small camera at the end of a tube can be used to examine your colon (sigmoidoscopy or colonoscopy). This checks for the earliest forms of colorectal cancer.  Prostate and Testicular Cancer  Depending on your age and overall health, your health care provider may do certain tests to screen for prostate and testicular cancer.  Talk to your health care provider about any symptoms or concerns you have about testicular or prostate cancer.  Skin Cancer  Check your skin from head to toe regularly.  Tell your health care provider about any new moles or changes in moles, especially if: ? There is a change in a mole's size, shape, or color. ? You have a mole that is larger than a pencil eraser.  Always use sunscreen. Apply sunscreen liberally and repeat throughout the day.  Protect yourself by wearing long sleeves, pants, a wide-brimmed hat, and sunglasses when outside.  What should I know  about heart disease, diabetes, and high blood pressure?  If you are 61-66 years of age, have your blood pressure checked every 3-5 years. If you are 1 years of age or older, have your blood pressure checked every year. You should have your blood pressure measured twice-once when you are at a hospital or clinic, and once when you are not at a hospital or clinic. Record the average of the two measurements. To check your blood pressure when you are not at a hospital or clinic, you can use: ? An automated blood pressure machine at a  pharmacy. ? A home blood pressure monitor.  Talk to your health care provider about your target blood pressure.  If you are between 36-42 years old, ask your health care provider if you should take aspirin to prevent heart disease.  Have regular diabetes screenings by checking your fasting blood sugar level. ? If you are at a normal weight and have a low risk for diabetes, have this test once every three years after the age of 53. ? If you are overweight and have a high risk for diabetes, consider being tested at a younger age or more often.  A one-time screening for abdominal aortic aneurysm (AAA) by ultrasound is recommended for men aged 37-75 years who are current or former smokers. What should I know about preventing infection? Hepatitis B If you have a higher risk for hepatitis B, you should be screened for this virus. Talk with your health care provider to find out if you are at risk for hepatitis B infection. Hepatitis C Blood testing is recommended for:  Everyone born from [birth year] through 1965.  Anyone with known risk factors for hepatitis C.  Sexually Transmitted Diseases (STDs)  You should be screened each year for STDs including gonorrhea and chlamydia if: ? You are sexually active and are younger than 77 years of age. ? You are older than 77 years of age and your health care provider tells you that you are at risk for this type of infection. ? Your sexual activity has changed since you were last screened and you are at an increased risk for chlamydia or gonorrhea. Ask your health care provider if you are at risk.  Talk with your health care provider about whether you are at high risk of being infected with HIV. Your health care provider may recommend a prescription medicine to help prevent HIV infection.  What else can I do?  Schedule regular health, dental, and eye exams.  Stay current with your vaccines (immunizations).  Do not use any tobacco products, such as  cigarettes, chewing tobacco, and e-cigarettes. If you need help quitting, ask your health care provider.  Limit alcohol intake to no more than 2 drinks per day. One drink equals 12 ounces of beer, 5 ounces of wine, or 1 ounces of hard liquor.  Do not use street drugs.  Do not share needles.  Ask your health care provider for help if you need support or information about quitting drugs.  Tell your health care provider if you often feel depressed.  Tell your health care provider if you have ever been abused or do not feel safe at home. This information is not intended to replace advice given to you by your health care provider. Make sure you discuss any questions you have with your health care provider. Document Released: 11/27/2007 Document Revised: 01/28/2016 Document Reviewed: 03/04/2015 Elsevier Interactive Patient Education  [employer].

## 2017-09-14 NOTE — Assessment & Plan Note (Signed)
Preventative protocols reviewed and updated unless pt declined. Discussed healthy diet and lifestyle.  

## 2017-09-14 NOTE — Progress Notes (Addendum)
BP (!) 152/78 (BP Location: Right Arm, Cuff Size: Normal)   Pulse 65   Temp 98 F (36.7 C) (Oral)   Ht 5' 5.5" (1.664 m)   Wt 202 lb 8 oz (91.9 kg)   SpO2 97%   BMI 33.19 kg/m    CC: CPE/AMW Subjective:    Patient ID: Jeffrey Villarreal, male    DOB: January 14, 1941, 77 y.o.   MRN: [id number]  HPI: Jeffrey Villarreal is a 77 y.o. male presenting on 09/14/2017 for Medicare Wellness (States he feels a little dizzy for about 20 mins after taking BP med in AM. But then feels better. )   See recent notes for details - seen here several times with uncontrolled hypertension - last visit we started amlodipine 10mg  daily in addition to his lisinopril 20mg  daily. He finds this has better controlled blood pressures. Takes both medicines in the morning. Mild dizziness in the mornings 10 min after meds but then feels well. BP at home running well controlled - 140-170s systolic. He bought BP cuff at home.   8 lbs down in the last month! Decreased portion sizes.   DOBUTAMINE STRESS ECHO 2016 WNL Eldridge Dace)  Hearing - passed Vision screen - upcoming appt next week ([place]).  No falls this past year Denies depression, anhedonia.   Preventative: Colonoscopy 08/2009, large int hemorrhoids, benign lymphoid polyp, rpt due 10 yrs Elnoria Howard)  Prostate - screening done at [facility]rology yearly in September - sent ROI to [facility] urology - "not our patient".  Never taken flu shot - declines  Tetanus - unsure, thinks ~2009  Pneumococcal - never received. Discussed reasons to immunize. Currently declines  zostavax - never received. Not currently interested  shingrix - not interested Advanced directives - no directive yet. Would want wife to be HCPOA. Has packet at home. Seat belt use discussed Sunscreen use discussed. No changing moles on skin. Ex smoker Alcohol - none  Caffeine: 1 cup coffee.  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 Interim medical history since our last visit reviewed. Allergies and medications reviewed and updated. Outpatient Medications Prior to Visit  Medication Sig Dispense Refill  . amLODipine (NORVASC) 10 MG tablet Take 1 tablet (10 mg total) by mouth daily. 90 tablet 3  . aspirin EC 81 MG tablet Take 81 mg by mouth daily.    Marland Kitchen lisinopril (PRINIVIL,ZESTRIL) 20 MG tablet Take 1 tablet (20 mg total) by mouth daily. 90 tablet 3  . polyethylene glycol powder (GLYCOLAX/MIRALAX) powder Take 17 g by mouth daily as needed for moderate constipation. 850 g 1  . pravastatin (PRAVACHOL) 80 MG tablet Take 1 tablet (80 mg total) by mouth daily. 90 tablet 3   No facility-administered medications prior to visit.      Per HPI unless specifically indicated in ROS section below Review of Systems  Constitutional: Negative for activity change, appetite change, chills, fatigue, fever and unexpected weight change.  HENT: Negative for hearing loss.   Eyes: Negative for visual disturbance.  Respiratory: Negative for cough, chest tightness, shortness of breath and wheezing.   Cardiovascular: Negative for chest pain, palpitations and leg swelling.  Gastrointestinal: Negative for abdominal distention, abdominal pain, blood in stool, constipation, diarrhea, nausea and vomiting.  Genitourinary: Negative for difficulty urinating and  hematuria.  Musculoskeletal: Negative for arthralgias, myalgias and neck pain.  Skin: Negative for rash.  Neurological: Positive for dizziness (after BP meds in the morning). Negative for seizures, syncope and headaches.  Hematological: Negative for adenopathy. Does not bruise/bleed easily.  Psychiatric/Behavioral: Negative for dysphoric mood. The patient is not nervous/anxious.          Objective:    BP (!) 152/78 (BP Location: Right Arm, Cuff Size: Normal)   Pulse 65   Temp 98 F (36.7 C) (Oral)   Ht 5' 5.5" (1.664 m)   Wt 202 lb 8 oz (91.9 kg)   SpO2 97%   BMI 33.19 kg/m   Wt Readings from Last 3 Encounters:  09/14/17 202 lb 8 oz (91.9 kg)  09/02/17 206 lb (93.4 kg)  08/23/17 210 lb (95.3 kg)    Physical Exam  Constitutional: He is oriented to person, place, and time. He appears well-developed and well-nourished. No distress.  HENT:  Head: Normocephalic and atraumatic.  Right Ear: Hearing, tympanic membrane, external ear and ear canal normal.  Left Ear: Hearing, tympanic membrane, external ear and ear canal normal.  Nose: Nose normal.  Mouth/Throat: Uvula is midline, oropharynx is clear and moist and mucous membranes are normal. No oropharyngeal exudate, posterior oropharyngeal edema or posterior oropharyngeal erythema.  Eyes: Pupils are equal, round, and reactive to light. Conjunctivae and EOM are normal. No scleral icterus.  Neck: Normal range of motion. Neck supple. Carotid bruit is present (bilateral). No thyromegaly present.  Cardiovascular: Normal rate, regular rhythm, normal heart sounds and intact distal pulses.  No murmur heard. Pulses:      Radial pulses are 2+ on the right side, and 2+ on the left side.  Pulmonary/Chest: Effort normal and breath sounds normal. No respiratory distress. He has no wheezes. He has no rales.  Abdominal: Soft. Bowel sounds are normal. He exhibits no distension and no mass. There is no tenderness. There is no rebound and no guarding.  Musculoskeletal: Normal range of motion. He exhibits no edema.  Lymphadenopathy:    He has no cervical adenopathy.  Neurological: He is alert and oriented to person, place, and time.  CN grossly intact, station and gait intact Recall 2/3, 3/3 with cue Calculation 2/5 serial 3s, 3/5 [id number]  Skin: Skin is warm and dry. No rash noted.  Psychiatric: He has a normal mood and affect. His  behavior is normal. Judgment and thought content normal.  Nursing note and vitals reviewed.  Results for orders placed or performed in visit on 09/02/17  TSH  Result Value Ref Range   TSH 2.03 0.35 - 4.50 uIU/mL  Basic metabolic panel  Result Value Ref Range   Sodium 137 135 - 145 mEq/L   Potassium 4.6 3.5 - 5.1 mEq/L   Chloride 100 96 - 112 mEq/L   CO2 27 19 - 32 mEq/L   Glucose, Bld 99 70 - 99 mg/dL   BUN 12 6 - 23 mg/dL   Creatinine, Ser 1.61 0.40 - 1.50 mg/dL   Calcium 9.5 8.4 - 09.6 mg/dL   GFR 04.54 >09.81 mL/min  Microalbumin / creatinine urine ratio  Result Value Ref Range   Microalb, Ur <0.7 0.0 - 1.9 mg/dL   Creatinine,U 19.1 mg/dL   Microalb Creat Ratio 0.8 0.0 - 30.0 mg/g      Assessment & Plan:   Problem List Items Addressed This Visit    Advanced care planning/counseling discussion    Advanced directives - no directive yet. Would want wife  to be HCPOA. Has packet at home.      Carotid stenosis    Update carotid US.       Relevant Orders   VAS US CAROTID   Health maintenance examination    Preventative protocols reviewed and updated unless pt declined. Discussed healthy diet and lifestyle.       Hyperlipidemia    Chronic, stable on pravastatin 80. Consider stronger statin.  The 10-year ASCVD risk score Denman Orval(Goff DC Montez HagemanJr., et al., 2013) is: 47.3%   Values used to calculate the score:     Age: [age] years     Sex: Male     Is Non-Hispanic African American: Yes     Diabetic: Yes     Tobacco smoker: No     Systolic Blood Pressure: 152 mmHg     Is BP treated: Yes     HDL Cholesterol: 40.5 mg/dL     Total Cholesterol: 130 mg/dL       Relevant Orders   Lipid panel   Hypertension (Chronic)    Chronic, improving with addition of amlodipine - continue this. He now has BP cuff at home. I asked him to monitor BP and bring log for me to review in 2 wks. He requests sooner f/u so will return in 1 month for f/u visit.       Medicare annual wellness visit,  subsequent - Primary    I have personally reviewed the Medicare Annual Wellness questionnaire and have noted 1. The patient's medical and social history 2. Their use of alcohol, tobacco or illicit drugs 3. Their current medications and supplements 4. The patient's functional ability including ADL's, fall risks, home safety risks and hearing or visual impairment. Cognitive function has been assessed and addressed as indicated.  5. Diet and physical activity 6. Evidence for depression or mood disorders The patients weight, height, BMI have been recorded in the chart. I have made referrals, counseling and provided education to the patient based on review of the above and I have provided the pt with a written personalized care plan for preventive services. Provider list updated.. See scanned questionairre as needed for further documentation. Reviewed preventative protocols and updated unless pt declined.       Memory deficit    Failed calculation today - anticipate related to educational level rather than true memory trouble.       Obesity, Class I, BMI 30-34.9    Reviewed healthy diet and lifestyle changes to affect sustainable weight loss.       Prediabetes    Chronic, update A1c today.       Relevant Orders   Hemoglobin A1c       No orders of the defined types were placed in this encounter.  Orders Placed This Encounter  Procedures  . Lipid panel  . Hemoglobin A1c    Follow up plan: Return in about 1 month (around 10/14/2017) for follow up visit.  Eustaquio BoydenJavier Sui Kasparek, MD

## 2017-09-14 NOTE — Assessment & Plan Note (Signed)

## 2017-09-17 ENCOUNTER — Encounter: Payer: Self-pay | Admitting: Family Medicine

## 2017-09-20 DIAGNOSIS — H01131 Eczematous dermatitis of right upper eyelid: Secondary | ICD-10-CM | POA: Diagnosis not present

## 2017-09-20 DIAGNOSIS — H01134 Eczematous dermatitis of left upper eyelid: Secondary | ICD-10-CM | POA: Diagnosis not present

## 2017-09-20 DIAGNOSIS — H10413 Chronic giant papillary conjunctivitis, bilateral: Secondary | ICD-10-CM | POA: Diagnosis not present

## 2017-09-20 DIAGNOSIS — H11153 Pinguecula, bilateral: Secondary | ICD-10-CM | POA: Diagnosis not present

## 2017-09-29 ENCOUNTER — Ambulatory Visit (HOSPITAL_COMMUNITY)
Admission: RE | Admit: 2017-09-29 | Payer: Medicare Other | Source: Ambulatory Visit | Attending: Family Medicine | Admitting: Family Medicine

## 2017-09-30 ENCOUNTER — Ambulatory Visit (HOSPITAL_COMMUNITY)
Admission: RE | Admit: 2017-09-30 | Discharge: 2017-09-30 | Disposition: A | Discharge status: Home / Self Care | Payer: Medicare Other | Source: Ambulatory Visit | Attending: Cardiology | Admitting: Cardiology

## 2017-09-30 DIAGNOSIS — E119 Type 2 diabetes mellitus without complications: Secondary | ICD-10-CM | POA: Insufficient documentation

## 2017-09-30 DIAGNOSIS — I10 Essential (primary) hypertension: Secondary | ICD-10-CM | POA: Diagnosis not present

## 2017-09-30 DIAGNOSIS — E785 Hyperlipidemia, unspecified: Secondary | ICD-10-CM | POA: Diagnosis not present

## 2017-09-30 DIAGNOSIS — Z87891 Personal history of nicotine dependence: Secondary | ICD-10-CM | POA: Insufficient documentation

## 2017-09-30 DIAGNOSIS — I6523 Occlusion and stenosis of bilateral carotid arteries: Secondary | ICD-10-CM | POA: Diagnosis not present

## 2017-10-13 ENCOUNTER — Encounter: Payer: Self-pay | Admitting: Family Medicine

## 2017-10-13 ENCOUNTER — Ambulatory Visit (INDEPENDENT_AMBULATORY_CARE_PROVIDER_SITE_OTHER): Payer: Medicare Other | Admitting: Family Medicine

## 2017-10-13 VITALS — BP 138/70 | HR 72 | Temp 97.8°F | Ht 65.5 in | Wt 203.2 lb

## 2017-10-13 DIAGNOSIS — I10 Essential (primary) hypertension: Secondary | ICD-10-CM | POA: Diagnosis not present

## 2017-10-13 DIAGNOSIS — E1169 Type 2 diabetes mellitus with other specified complication: Secondary | ICD-10-CM | POA: Diagnosis not present

## 2017-10-13 DIAGNOSIS — E785 Hyperlipidemia, unspecified: Secondary | ICD-10-CM | POA: Diagnosis not present

## 2017-10-13 MED ORDER — ATENOLOL 25 MG PO TABS: 25.0000 mg | ORAL_TABLET | Freq: Every day | ORAL | 3 refills | Status: DC

## 2017-10-13 NOTE — Progress Notes (Signed)
BP 138/70 (BP Location: Left Arm, Patient Position: Sitting, Cuff Size: Normal)   Pulse 72   Temp 97.8 F (36.6 C) (Oral)   Ht 5' 5.5" (1.664 m)   Wt 203 lb 4 oz (92.2 kg)   SpO2 96%   BMI 33.31 kg/m    CC: HTN f/u  Subjective:    Patient ID: Jeffrey Villarreal, male    DOB: 1941/02/27, 77 y.o.   MRN: [id number]  HPI: Jeffrey Villarreal is a 77 y.o. male presenting on 10/13/2017 for Hypertension (Here for 1 mo follow-up. Pt provided recent BP readings. Also, brought in home BP monitor to compare. Registered 134/56 in office.)   Sudden uncontrolled hypertension this year. We have titrated his antihypertensive regimen to amlodipine  daily + lisinopril  daily. This has helped some but blood pressures remain elevated. Brings log showing bp 140-160 systolic the beginning of April, the last week of April BP 130-140s systolic.   No HA, vision changes, CP/tightness, SOB, leg swelling.  Denies significant urinary concerns.   Relevant past medical, surgical, family and social history reviewed and updated as indicated. Interim medical history since our last visit reviewed. Allergies and medications reviewed and updated. Outpatient Medications Prior to Visit  Medication Sig Dispense Refill  . amLODipine (NORVASC) 10 MG tablet Take 1 tablet (10 mg total) by mouth daily. 90 tablet 3  . aspirin EC 81 MG tablet Take 81 mg by mouth daily.    Marland Kitchen lisinopril (PRINIVIL,ZESTRIL) 20 MG tablet Take 1 tablet (20 mg total) by mouth daily. 90 tablet 3  . polyethylene glycol powder (GLYCOLAX/MIRALAX) powder Take 17 g by mouth daily as needed for moderate constipation. 850 g 1  . pravastatin (PRAVACHOL) 80 MG tablet Take 1 tablet (80 mg total) by mouth daily. 90 tablet 3   No facility-administered medications prior to visit.      Per HPI unless specifically indicated in ROS section below Review of Systems     Objective:    BP 138/70 (BP Location: Left Arm, Patient Position: Sitting, Cuff Size: Normal)    Pulse 72   Temp 97.8 F (36.6 C) (Oral)   Ht 5' 5.5" (1.664 m)   Wt 203 lb 4 oz (92.2 kg)   SpO2 96%   BMI 33.31 kg/m   Wt Readings from Last 3 Encounters:  10/13/17 203 lb 4 oz (92.2 kg)  09/14/17 202 lb 8 oz (91.9 kg)  09/02/17 206 lb (93.4 kg)    Physical Exam  Constitutional: He appears well-developed and well-nourished. No distress.  HENT:  Head: Normocephalic and atraumatic.  Mouth/Throat: Oropharynx is clear and moist. No oropharyngeal exudate.  Neck: Carotid bruit is not present.  Cardiovascular: Normal rate, regular rhythm, normal heart sounds and intact distal pulses.  No murmur heard. Pulmonary/Chest: Breath sounds normal. No respiratory distress. He has no wheezes. He has no rales.  Skin: Skin is warm and dry. No rash noted.  Psychiatric: He has a normal mood and affect.  Nursing note and vitals reviewed.  Results for orders placed or performed in visit on 09/14/17  Lipid panel  Result Value Ref Range   Cholesterol 137 0 - 200 mg/dL   Triglycerides 40.9 0.0 - 149.0 mg/dL   HDL 81.19 >14.78 mg/dL   VLDL 29.5 0.0 - 62.1 mg/dL   LDL Cholesterol 80 0 - 99 mg/dL   Total CHOL/HDL Ratio 3    NonHDL 95.89   Hemoglobin A1c  Result Value Ref Range   Hgb A1c  MFr Bld 6.8 (H) 4.6 - 6.5 %   Lab Results  Component Value Date   CREATININE 1.26 09/02/2017   BUN 12 09/02/2017   NA 137 09/02/2017   K 4.6 09/02/2017   CL 100 09/02/2017   CO2 27 09/02/2017       Assessment & Plan:  He states he sees urologist yearly in September at [facility] urology. When we requested records, they said they had not seen him. He has records at home - I asked him to bring in to next visit to update chart. Problem List Items Addressed This Visit    Hyperlipidemia    Chronic, stable. Continue current regimen       Relevant Medications   atenolol (TENORMIN) 25 MG tablet   Hypertension - Primary (Chronic)    Chronic, adequate over the past week however early April BP was running too  high. Printed Rx for atenolol  to take once daily to fill if persistently elevated readings at home. Continue amlodipine, lisinopril in interim. Pt agrees with plan.      Relevant Medications   atenolol (TENORMIN) 25 MG tablet   Type 2 diabetes mellitus with other specified complication (HCC)    Reviewed recent increase in A1c, encouraged low carb low sugar diet. Recheck next visit.           Meds ordered this encounter  Medications  . atenolol (TENORMIN) 25 MG tablet    Sig: Take 1 tablet (25 mg total) by mouth daily.    Dispense:  30 tablet    Refill:  3   No orders of the defined types were placed in this encounter.   Follow up plan: No follow-ups on file.  Jeffrey Boyden, MD

## 2017-10-13 NOTE — Assessment & Plan Note (Signed)
Chronic, adequate over the past week however early April BP was running too high. Printed Rx for atenolol  to take once daily to fill if persistently elevated readings at home. Continue amlodipine, lisinopril in interim. Pt agrees with plan.

## 2017-10-13 NOTE — Patient Instructions (Signed)
Bring me records from [facility] urology to next visit.  Continue 2 blood pressure medicines, I will print out prescription for a 3rd in case blood pressure is consistently staying 150-160/80-90s. If closer to 130-140/70-80s, don't fill.  Return in 3 months for follow up on blood pressure, sooner if needed.

## 2017-10-13 NOTE — Assessment & Plan Note (Signed)
Reviewed recent increase in A1c, encouraged low carb low sugar diet. Recheck next visit.

## 2017-10-13 NOTE — Assessment & Plan Note (Signed)
Chronic, stable. Continue current regimen. 

## 2017-11-21 ENCOUNTER — Telehealth: Payer: Self-pay | Admitting: Family Medicine

## 2017-11-22 ENCOUNTER — Other Ambulatory Visit: Payer: Self-pay | Admitting: *Deleted

## 2017-11-22 MED ORDER — PRAVASTATIN SODIUM 80 MG PO TABS: 80.0000 mg | ORAL_TABLET | Freq: Every day | ORAL | 2 refills | Status: DC

## 2017-11-22 NOTE — Telephone Encounter (Signed)
Rx reordered for patient with adjusted refills. Rx not at pharmacy per note.

## 2017-11-22 NOTE — Telephone Encounter (Signed)
I have contacted [facility] on [address] regarding RX for pravastatin. They stated there were no refills received on 08/23/17 and they need a new RX. This pharmacy was [facility] and got changed to [facility] so perhaps it was lost in the transfer.   Please send in new RX. Pt is out of medication now and does not have for today.  [facility] [phone] - [place], [place] - [phone] (Phone) [phone] (Fax)

## 2018-01-16 ENCOUNTER — Ambulatory Visit: Payer: Medicare Other | Admitting: Family Medicine

## 2018-01-16 DIAGNOSIS — Z0289 Encounter for other administrative examinations: Secondary | ICD-10-CM

## 2018-02-14 ENCOUNTER — Ambulatory Visit: Payer: Self-pay | Admitting: *Deleted

## 2018-02-14 DIAGNOSIS — Z961 Presence of intraocular lens: Secondary | ICD-10-CM | POA: Diagnosis not present

## 2018-02-14 DIAGNOSIS — H10413 Chronic giant papillary conjunctivitis, bilateral: Secondary | ICD-10-CM | POA: Diagnosis not present

## 2018-02-14 DIAGNOSIS — H26493 Other secondary cataract, bilateral: Secondary | ICD-10-CM | POA: Diagnosis not present

## 2018-02-14 NOTE — Telephone Encounter (Signed)
Pt calling stating he has been experiencing constipation off and on and has a history of constipation. Pt states his last BM was on Sunday and it was loose and dark green in color. Pt states he has taken [employer] Magnesia to help with constipation. Pt denies any abdominal pain but states he does experience sharp pain down his right leg at times when trying to make a bowel movement. Pt currently rating back pain at 4 and states that it's no "real bad". Pt also mentions that he experiences dizziness when trying to use the bathroom and states that the last time this occurred was on Sunday. Pt also voices that bilateral hands get numb sometimes and last a couple of minutes and then go away. Pt denies experiencing any dizziness or numbness in hands at this time. Pt advised to return call to the office if symptoms become worse before scheduled appt or if dizziness or numbness occurs again. Pt verbalized understanding . Reason for Disposition . [1] Uses laxative (e.g., PEG / Miralax. Milk of magnesia) or enema AND [2] > once a month  Answer Assessment - Initial Assessment Questions 1. STOOL PATTERN OR FREQUENCY: "How often do you pass bowel movements (BMs)?"  (Normal range: tid to q 3 days)  "When was the last BM passed?"       Last BM Sunday, every other day 2. STRAINING: "Do you have to strain to have a BM?"      yes 3. RECTAL PAIN: "Does your rectum hurt when the stool comes out?" If so, ask: "Do you have hemorrhoids? How bad is the pain?"  (Scale 1-10; or mild, moderate, severe)     A little pain 4. STOOL COMPOSITION: "Are the stools hard?"      Yes but stool on Sunday was loose and dark green in color 5. BLOOD ON STOOLS: "Has there been any blood on the toilet tissue or on the surface of the BM?" If so, ask: "When was the last time?"      No, but about 2 weeks ago blood was on stool 6. CHRONIC CONSTIPATION: "Is this a new problem for you?"  If no, ask: "How long have you had this problem?"  (days, weeks, months)      Yes 7. CHANGES IN DIET: "Have there been any recent changes in your diet?"      No 8. MEDICATIONS: "Have you been taking any new medications?"     No 9. LAXATIVES: "Have you been using any laxatives or enemas?"  If yes, ask "What, how often, and when was the last time?"     [place] Milk of magnesia 10. CAUSE: "What do you think is causing the constipation?"        Don't know 11. OTHER SYMPTOMS: "Do you have any other symptoms?" (e.g., abdominal pain, fever, vomiting)       Lower back pain that comes and goes. Pt states he also notes that he has a sharp pain that goes down the right leg when trying to make a bowel movement 12. PREGNANCY: "Is there any chance you are pregnant?" "When was your last menstrual period?"       n/a  Protocols used: CONSTIPATION-A-AH

## 2018-02-16 ENCOUNTER — Encounter: Payer: Self-pay | Admitting: Family Medicine

## 2018-02-16 ENCOUNTER — Ambulatory Visit (INDEPENDENT_AMBULATORY_CARE_PROVIDER_SITE_OTHER): Payer: Medicare Other | Admitting: Family Medicine

## 2018-02-16 VITALS — BP 158/80 | HR 68 | Temp 97.8°F | Ht 65.5 in | Wt 200.0 lb

## 2018-02-16 DIAGNOSIS — I10 Essential (primary) hypertension: Secondary | ICD-10-CM

## 2018-02-16 DIAGNOSIS — K5900 Constipation, unspecified: Secondary | ICD-10-CM

## 2018-02-16 MED ORDER — ATENOLOL 25 MG PO TABS: 25.0000 mg | ORAL_TABLET | Freq: Every day | ORAL | 1 refills | Status: DC

## 2018-02-16 NOTE — Assessment & Plan Note (Signed)
Has not been taking atenolol - will restart this along with amlodipine and lisinopril.

## 2018-02-16 NOTE — Progress Notes (Signed)
BP (!) 158/80 (BP Location: Right Arm, Patient Position: Sitting, Cuff Size: Normal)   Pulse 68   Temp 97.8 F (36.6 C) (Oral)   Ht 5' 5.5" (1.664 m)   Wt 200 lb (90.7 kg)   SpO2 96%   BMI 32.78 kg/m    CC: constipation  Subjective:    Patient ID: Jeffrey Villarreal, male    DOB: 1941-03-29, 77 y.o.   MRN: [id number]  HPI: Jeffrey Villarreal is a 77 y.o. male presenting on 02/16/2018 for Constipation (C/o constipation for 1-2 wks. Tried [employer], barely helpful. )   1-2 wk h/o constipation - some dizziness and R leg pain with straining. Has been using miralax (once daily but not this week) and milk of magnesia (twice weekly). Small amt stool this morning.   Does good with water daily, also drinks ginger ale. Limited fiber in diet.   No abd pain, nausea/vomiting, blood in stool. Passing gas ok. No weight loss. Occasional gassiness/bloating.   COLONOSCOPY 08/2009 - large int hemorrhoids, benign lymphoid polyp, rpt due 10 yrs Jeffrey Villarreal)   Relevant past medical, surgical, family and social history reviewed and updated as indicated. Interim medical history since our last visit reviewed. Allergies and medications reviewed and updated. Outpatient Medications Prior to Visit  Medication Sig Dispense Refill  . amLODipine (NORVASC) 10 MG tablet Take 1 tablet (10 mg total) by mouth daily. 90 tablet 3  . aspirin EC 81 MG tablet Take 81 mg by mouth daily.    Marland Kitchen lisinopril (PRINIVIL,ZESTRIL) 20 MG tablet Take 1 tablet (20 mg total) by mouth daily. 90 tablet 3  . polyethylene glycol powder (GLYCOLAX/MIRALAX) powder Take 17 g by mouth daily as needed for moderate constipation. 850 g 1  . pravastatin (PRAVACHOL) 80 MG tablet Take 1 tablet (80 mg total) by mouth daily. 90 tablet 2  . atenolol (TENORMIN) 25 MG tablet Take 1 tablet (25 mg total) by mouth daily. (Patient not taking: Reported on 02/16/2018) 30 tablet 3   No facility-administered medications prior to visit.      Per HPI unless  specifically indicated in ROS section below Review of Systems     Objective:    BP (!) 158/80 (BP Location: Right Arm, Patient Position: Sitting, Cuff Size: Normal)   Pulse 68   Temp 97.8 F (36.6 C) (Oral)   Ht 5' 5.5" (1.664 m)   Wt 200 lb (90.7 kg)   SpO2 96%   BMI 32.78 kg/m   Wt Readings from Last 3 Encounters:  02/16/18 200 lb (90.7 kg)  10/13/17 203 lb 4 oz (92.2 kg)  09/14/17 202 lb 8 oz (91.9 kg)    Physical Exam  Constitutional: He appears well-developed and well-nourished. No distress.  HENT:  Head: Normocephalic and atraumatic.  Mouth/Throat: Oropharynx is clear and moist. No oropharyngeal exudate.  Abdominal: Soft. Normal appearance and bowel sounds are normal. He exhibits no distension, no fluid wave and no mass. There is no hepatosplenomegaly. There is no tenderness. There is no rebound, no guarding and no CVA tenderness.  Musculoskeletal: He exhibits no edema.  Nursing note and vitals reviewed.  Results for orders placed or performed in visit on 09/14/17  Lipid panel  Result Value Ref Range   Cholesterol 137 0 - 200 mg/dL   Triglycerides 41.7 0.0 - 149.0 mg/dL   HDL 40.81 >44.81 mg/dL   VLDL 85.6 0.0 - 31.4 mg/dL   LDL Cholesterol 80 0 - 99 mg/dL   Total CHOL/HDL  Ratio 3    NonHDL 95.89   Hemoglobin A1c  Result Value Ref Range   Hgb A1c MFr Bld 6.8 (H) 4.6 - 6.5 %   Lab Results  Component Value Date   WBC 8.2 08/19/2014   HGB 14.8 08/19/2014   HCT 44.3 08/19/2014   MCV 82.9 08/19/2014   PLT 319.0 08/19/2014      Assessment & Plan:   Problem List Items Addressed This Visit    Hypertension (Chronic)    Has not been taking atenolol - will restart this along with amlodipine and lisinopril.       Relevant Medications   atenolol (TENORMIN) 25 MG tablet   Constipation - Primary    No red flags today. Worsening last few weeks. Encouraged he increase miralax to BID for next week, hold for diarrhea. May try senna, continue MOM, add dietary fiber or  add soluble fiber supplement.  Pt agrees with plan.           Meds ordered this encounter  Medications  . atenolol (TENORMIN) 25 MG tablet    Sig: Take 1 tablet (25 mg total) by mouth daily.    Dispense:  90 tablet    Refill:  1   No orders of the defined types were placed in this encounter.   Follow up plan: Return if symptoms worsen or fail to improve.  Eustaquio Boyden, MD

## 2018-02-16 NOTE — Patient Instructions (Addendum)
If not regular with fiber in diet, consider starting fiber supplement like metamucil or benefiber.  Increase miralax to 1 capful twice daily for the next several days, hold for diarrhea.  May continue milk of magnesia as needed, consider starting senna over the counter.   Constipation, Adult Constipation is when a person has fewer bowel movements in a week than normal, has difficulty having a bowel movement, or has stools that are dry, hard, or larger than normal. Constipation may be caused by an underlying condition. It may become worse with age if a person takes certain medicines and does not take in enough fluids. Follow these instructions at home: Eating and drinking   Eat foods that have a lot of fiber, such as fresh fruits and vegetables, whole grains, and beans.  Limit foods that are high in fat, low in fiber, or overly processed, such as french fries, hamburgers, cookies, candies, and soda.  Drink enough fluid to keep your urine clear or pale yellow. General instructions  Exercise regularly or as told by your health care provider.  Go to the restroom when you have the urge to go. Do not hold it in.  Take over-the-counter and prescription medicines only as told by your health care provider. These include any fiber supplements.  Practice pelvic floor retraining exercises, such as deep breathing while relaxing the lower abdomen and pelvic floor relaxation during bowel movements.  Watch your condition for any changes.  Keep all follow-up visits as told by your health care provider. This is important. Contact a health care provider if:  You have pain that gets worse.  You have a fever.  You do not have a bowel movement after 4 days.  You vomit.  You are not hungry.  You lose weight.  You are bleeding from the anus.  You have thin, pencil-like stools. Get help right away if:  You have a fever and your symptoms suddenly get worse.  You leak stool or have blood in  your stool.  Your abdomen is bloated.  You have severe pain in your abdomen.  You feel dizzy or you faint. This information is not intended to replace advice given to you by your health care provider. Make sure you discuss any questions you have with your health care provider. Document Released: 02/27/2004 Document Revised: 12/19/2015 Document Reviewed: 11/19/2015 Elsevier Interactive Patient Education  2018 [employer].

## 2018-02-16 NOTE — Assessment & Plan Note (Signed)
No red flags today. Worsening last few weeks. Encouraged he increase miralax to BID for next week, hold for diarrhea. May try senna, continue MOM, add dietary fiber or add soluble fiber supplement.  Pt agrees with plan.

## 2018-03-06 ENCOUNTER — Encounter: Payer: Self-pay | Admitting: Emergency Medicine

## 2018-03-06 ENCOUNTER — Encounter: Payer: Self-pay | Admitting: Family Medicine

## 2018-03-06 ENCOUNTER — Ambulatory Visit (INDEPENDENT_AMBULATORY_CARE_PROVIDER_SITE_OTHER): Payer: Medicare Other | Admitting: Family Medicine

## 2018-03-06 VITALS — BP 140/62 | HR 53 | Temp 98.1°F | Ht 65.5 in | Wt 208.0 lb

## 2018-03-06 DIAGNOSIS — M5441 Lumbago with sciatica, right side: Secondary | ICD-10-CM | POA: Diagnosis not present

## 2018-03-06 NOTE — Progress Notes (Signed)
Subjective:    Patient ID: JAHQUAN KLUGH, male    DOB: [birth year], 77 y.o.   MRN: [id number]  HPI This is a 77 yo male who presents today with spasms and pain of right leg x 2 weeks. Pain is in right buttock, feels "tight," travels down his leg and is sharp. Some pain of low back on right side. Does not recall any trauma or overuse. No falls. Has tried Tylenol without relief. Temporary relief with heating pad. Eases a little, but never goes away completely. Can't get comfortable. Has been taking Miralax daily and Milk of Mag prn, not often. Some pain in leg after straining for bowel movement a couple of week ago. Currently having bowel movements twice a day, sometimes loose. Feels like good relief with daily Miralax.   Past Medical History:  Diagnosis Date  . Carotid stenosis 08/2014   1-39% bilateral, rpt [place] 2 yrs  . Diet-controlled type 2 diabetes mellitus (HCC)    DSME 02/2013  . History of seizures as a child remote  . Hyperlipidemia   . Hypertension    Past Surgical History:  Procedure Laterality Date  . CATARACT EXTRACTION Right 2013  . COLONOSCOPY  08/2009   large int hemorrhoids, benign lymphoid polyp, rpt due 10 yrs Elnoria Howard)  . DOBUTAMINE STRESS ECHO  2016   WNL Eldridge Dace)   Family History  Problem Relation Age of Onset  . Hypertension Mother   . Hypertension Father   . Cancer Sister        lung  . Cancer Sister        lung  . Coronary artery disease Neg Hx   . Stroke Neg Hx   . Diabetes Neg Hx   . Kidney disease Neg Hx   . Hyperlipidemia Neg Hx    Social History   Tobacco Use  . Smoking status: Former Smoker    Last attempt to quit: 06/14/1985    Years since quitting: 32.7  . Smokeless tobacco: Never Used  Substance Use Topics  . Alcohol use: No    Comment: Quit 1987  . Drug use: No      Review of Systems Per HPI    Objective:   Physical Exam  Constitutional: He is oriented to person, place, and time. He appears well-developed and well-nourished. No  distress.  HENT:  Head: Normocephalic and atraumatic.  Eyes: Conjunctivae are normal.  Neck: Normal range of motion. Neck supple.  Cardiovascular: Normal rate.  Pulmonary/Chest: Effort normal.  Musculoskeletal:       Right hip: He exhibits tenderness (mild gluteal). He exhibits normal range of motion, normal strength and no bony tenderness.       Left hip: He exhibits decreased range of motion (slightly decreased flexion and extension, tight hamstring muscles).       Cervical back: Normal.       Thoracic back: Normal.       Lumbar back: He exhibits tenderness (right). He exhibits no bony tenderness.  Slight limp.   Neurological: He is alert and oriented to person, place, and time.  Skin: Skin is warm and dry. He is not diaphoretic.  Psychiatric: He has a normal mood and affect. His behavior is normal. Judgment and thought content normal.  Vitals reviewed.     BP 140/62 (BP Location: Right Arm, Patient Position: Sitting, Cuff Size: Large)   Pulse (!) 53   Temp 98.1 F (36.7 C) (Oral)   Ht 5' 5.5" (1.664 m)   Wt 208  lb (94.3 kg)   SpO2 97%   BMI 34.09 kg/m  Wt Readings from Last 3 Encounters:  03/06/18 208 lb (94.3 kg)  02/16/18 200 lb (90.7 kg)  10/13/17 203 lb 4 oz (92.2 kg)       Assessment & Plan:  1. Acute right-sided low back pain with right-sided sciatica -Provided written and verbal information regarding diagnosis and treatment. - will try low dose ibuprofen, 200- 400 mg every 8 to 12 hours, exercises - RTC precautions reviewed   Olean Reeeborah Matsue Strom, FNP-BC  [facility], [facility]  03/06/2018 12:20 PM

## 2018-03-06 NOTE — Patient Instructions (Signed)
Take 1 to 2 over the counter ibuprofen (Advil) tablets every 8 to 12 hours for 3-4 days then as needed for back and hip pain  If not better in 5-7 days or if worse, please schedule a follow up appointment   Low Back Strain Rehab Ask your health care provider which exercises are safe for you. Do exercises exactly as told by your health care provider and adjust them as directed. It is normal to feel mild stretching, pulling, tightness, or discomfort as you do these exercises, but you should stop right away if you feel sudden pain or your pain gets worse. Do not begin these exercises until told by your health care provider. Stretching and range of motion exercises These exercises warm up your muscles and joints and improve the movement and flexibility of your back. These exercises also help to relieve pain, numbness, and tingling. Exercise A: Single knee to chest  1. Lie on your back on a firm surface with both legs straight. 2. Bend one of your knees. Use your hands to move your knee up toward your chest until you feel a gentle stretch in your lower back and buttock. ? Hold your leg in this position by holding onto the front of your knee. ? Keep your other leg as straight as possible. 3. Hold for __________ seconds. 4. Slowly return to the starting position. 5. Repeat with your other leg. Repeat __________ times. Complete this exercise __________ times a day. Exercise B: Prone extension on elbows  1. Lie on your abdomen on a firm surface. 2. Prop yourself up on your elbows. 3. Use your arms to help lift your chest up until you feel a gentle stretch in your abdomen and your lower back. ? This will place some of your body weight on your elbows. If this is uncomfortable, try stacking pillows under your chest. ? Your hips should stay down, against the surface that you are lying on. Keep your hip and back muscles relaxed. 4. Hold for __________ seconds. 5. Slowly relax your upper body and return  to the starting position. Repeat __________ times. Complete this exercise __________ times a day. Strengthening exercises These exercises build strength and endurance in your back. Endurance is the ability to use your muscles for a long time, even after they get tired. Exercise C: Pelvic tilt 1. Lie on your back on a firm surface. Bend your knees and keep your feet flat. 2. Tense your abdominal muscles. Tip your pelvis up toward the ceiling and flatten your lower back into the floor. ? To help with this exercise, you may place a small towel under your lower back and try to push your back into the towel. 3. Hold for __________ seconds. 4. Let your muscles relax completely before you repeat this exercise. Repeat __________ times. Complete this exercise __________ times a day. Exercise D: Alternating arm and leg raises  1. Get on your hands and knees on a firm surface. If you are on a hard floor, you may want to use padding to cushion your knees, such as an exercise mat. 2. Line up your arms and legs. Your hands should be below your shoulders, and your knees should be below your hips. 3. Lift your left leg behind you. At the same time, raise your right arm and straighten it in front of you. ? Do not lift your leg higher than your hip. ? Do not lift your arm higher than your shoulder. ? Keep your abdominal and back  muscles tight. ? Keep your hips facing the ground. ? Do not arch your back. ? Keep your balance carefully, and do not hold your breath. 4. Hold for __________ seconds. 5. Slowly return to the starting position and repeat with your right leg and your left arm. Repeat __________ times. Complete this exercise __________times a day. Exercise J: Single leg lower with bent knees 1. Lie on your back on a firm surface. 2. Tense your abdominal muscles and lift your feet off the floor, one foot at a time, so your knees and hips are bent in an "L" shape (at about 90 degrees). ? Your knees  should be over your hips and your lower legs should be parallel to the floor. 3. Keeping your abdominal muscles tense and your knee bent, slowly lower one of your legs so your toe touches the ground. 4. Lift your leg back up to return to the starting position. ? Do not hold your breath. ? Do not let your back arch. Keep your back flat against the ground. 5. Repeat with your other leg. Repeat __________ times. Complete this exercise __________ times a day. Posture and body mechanics  Body mechanics refers to the movements and positions of your body while you do your daily activities. Posture is part of body mechanics. Good posture and healthy body mechanics can help to relieve stress in your body's tissues and joints. Good posture means that your spine is in its natural S-curve position (your spine is neutral), your shoulders are pulled back slightly, and your head is not tipped forward. The following are general guidelines for applying improved posture and body mechanics to your everyday activities. Standing   When standing, keep your spine neutral and your feet about hip-width apart. Keep a slight bend in your knees. Your ears, shoulders, and hips should line up.  When you do a task in which you stand in one place for a long time, place one foot up on a stable object that is 2-4 inches (5-10 cm) high, such as a footstool. This helps keep your spine neutral. Sitting   When sitting, keep your spine neutral and keep your feet flat on the floor. Use a footrest, if necessary, and keep your thighs parallel to the floor. Avoid rounding your shoulders, and avoid tilting your head forward.  When working at a desk or a computer, keep your desk at a height where your hands are slightly lower than your elbows. Slide your chair under your desk so you are close enough to maintain good posture.  When working at a computer, place your monitor at a height where you are looking straight ahead and you do not  have to tilt your head forward or downward to look at the screen. Resting   When lying down and resting, avoid positions that are most painful for you.  If you have pain with activities such as sitting, bending, stooping, or squatting (flexion-based activities), lie in a position in which your body does not bend very much. For example, avoid curling up on your side with your arms and knees near your chest (fetal position).  If you have pain with activities such as standing for a long time or reaching with your arms (extension-based activities), lie with your spine in a neutral position and bend your knees slightly. Try the following positions: ? Lying on your side with a pillow between your knees. ? Lying on your back with a pillow under your knees. Lifting   When lifting  objects, keep your feet at least shoulder-width apart and tighten your abdominal muscles.  Bend your knees and hips and keep your spine neutral. It is important to lift using the strength of your legs, not your back. Do not lock your knees straight out.  Always ask for help to lift heavy or awkward objects. This information is not intended to replace advice given to you by your health care provider. Make sure you discuss any questions you have with your health care provider. Document Released: 05/31/2005 Document Revised: 02/05/2016 Document Reviewed: 03/12/2015 Elsevier Interactive Patient Education  [employer].

## 2018-08-15 ENCOUNTER — Telehealth: Payer: Self-pay | Admitting: Family Medicine

## 2018-08-15 NOTE — Telephone Encounter (Signed)
E-scribed refills.  Pls schedule annual wellness visit.

## 2018-08-16 NOTE — Telephone Encounter (Signed)
Left message asking pt to call office  °

## 2018-08-29 NOTE — Telephone Encounter (Signed)
Left message asking pt to call office  °

## 2018-09-04 ENCOUNTER — Encounter: Payer: Self-pay | Admitting: Family Medicine

## 2018-09-04 NOTE — Telephone Encounter (Signed)
Mailed letter °

## 2018-09-04 NOTE — Telephone Encounter (Signed)
Noted  

## 2018-10-16 ENCOUNTER — Other Ambulatory Visit: Payer: Self-pay

## 2018-10-16 ENCOUNTER — Encounter: Payer: Self-pay | Admitting: Family Medicine

## 2018-10-16 ENCOUNTER — Ambulatory Visit (INDEPENDENT_AMBULATORY_CARE_PROVIDER_SITE_OTHER): Payer: Medicare Other | Admitting: Family Medicine

## 2018-10-16 VITALS — BP 137/58 | HR 62 | Temp 97.5°F | Ht 65.5 in

## 2018-10-16 DIAGNOSIS — R1013 Epigastric pain: Secondary | ICD-10-CM | POA: Diagnosis not present

## 2018-10-16 MED ORDER — OMEPRAZOLE 40 MG PO CPDR: 40.0000 mg | DELAYED_RELEASE_CAPSULE | Freq: Every day | ORAL | 1 refills | Status: DC

## 2018-10-16 NOTE — Progress Notes (Signed)
Virtual visit completed through [employer]. Due to national recommendations of social distancing due to COVID 19, a virtual visit is felt to be most appropriate for this patient at this time.   Patient location: home. Wife also present on call. Provider location: Adult nurse at [facility], office If any vitals were documented, they were collected by patient at home unless specified below.   BP (!) 137/58 (BP Location: Right Arm, Patient Position: Sitting)   Pulse 62   Temp (!) 97.5 F (36.4 C) (Oral)   Ht 5' 5.5" (1.664 m)   BMI 34.09 kg/m   BP Readings from Last 3 Encounters:  10/16/18 (!) 137/58  03/06/18 140/62  02/16/18 (!) 158/80    CC: epigastric pain Subjective:    Patient ID: Jeffrey Villarreal, male    DOB: September 06, 1940, 78 y.o.   MRN: [id number]  HPI: Jeffrey Villarreal is a 78 y.o. male presenting on 10/16/2018 for Abdominal Pain (C/o upper mid abd pain. Started 10/14/18 after eating some chips. Pain is off and on. Denies N/V/D. Tried Pepto Bismol and a laxative. Had dark, black looking be stool this morning. )   2d h/o sudden mid upper abdominal pain that started after eating bag of plain potato chips. Severe sharp pain without radiation to back. Pain returned today after eating lunch, lasted 7 minutes. Took laxative (phillips magnesia) and pepto bismol.   Mild constipation recently, now better after laxative.   Denies fevers/chills, nausea/vomiting, gassiness/indigestion or bloating, diarrhea or constipation. No blood in stool or urine. No dysuria or urgency. Denies significant GERD symptoms.  No unexpected weight loss.   No new foods or diet changes. Drinks 2-3 cups coffee.  No alcohol.  He tried ibuprofen 400mg  for pain.  Regularly takes aspirin 81mg  daily.   COLONOSCOPY 08/2009 - large int hemorrhoids, benign lymphoid polyp, rpt due 10 yrs Elnoria Howard)     Relevant past medical, surgical, family and social history reviewed and updated as indicated. Interim medical history since  our last visit reviewed. Allergies and medications reviewed and updated. Outpatient Medications Prior to Visit  Medication Sig Dispense Refill  . amLODipine (NORVASC) 10 MG tablet TAKE 1 TABLET(10 MG) BY MOUTH DAILY 90 tablet 0  . aspirin EC 81 MG tablet Take 81 mg by mouth daily.    Marland Kitchen atenolol (TENORMIN) 25 MG tablet TAKE 1 TABLET(25 MG) BY MOUTH DAILY 90 tablet 0  . lisinopril (PRINIVIL,ZESTRIL) 20 MG tablet Take 1 tablet (20 mg total) by mouth daily. 90 tablet 3  . polyethylene glycol powder (GLYCOLAX/MIRALAX) powder Take 17 g by mouth daily as needed for moderate constipation. 850 g 1  . pravastatin (PRAVACHOL) 80 MG tablet TAKE 1 TABLET BY MOUTH EVERY DAY. DISCONTINUE LIPITOR 90 tablet 0   No facility-administered medications prior to visit.      Per HPI unless specifically indicated in ROS section below Review of Systems Objective:    BP (!) 137/58 (BP Location: Right Arm, Patient Position: Sitting)   Pulse 62   Temp (!) 97.5 F (36.4 C) (Oral)   Ht 5' 5.5" (1.664 m)   BMI 34.09 kg/m   Wt Readings from Last 3 Encounters:  03/06/18 208 lb (94.3 kg)  02/16/18 200 lb (90.7 kg)  10/13/17 203 lb 4 oz (92.2 kg)     Physical exam: Gen: alert, NAD, not ill appearing Pulm: speaks in complete sentences without increased work of breathing Psych: normal mood, normal thought content      Results for orders placed or  performed in visit on 09/14/17  Lipid panel  Result Value Ref Range   Cholesterol 137 0 - 200 mg/dL   Triglycerides 16.180.0 0.0 - 149.0 mg/dL   HDL 09.6041.40 >45.40>39.00 mg/dL   VLDL 98.116.0 0.0 - 19.140.0 mg/dL   LDL Cholesterol 80 0 - 99 mg/dL   Total CHOL/HDL Ratio 3    NonHDL 95.89   Hemoglobin A1c  Result Value Ref Range   Hgb A1c MFr Bld 6.8 (H) 4.6 - 6.5 %   Assessment & Plan:   Problem List Items Addressed This Visit    Epigastric abdominal pain - Primary    Anticipate gastritis or possible PUD. Treat with omeprazole 40mg  daily x 3 wks then PRN. rec limit caffeine. No  red flags today. If not improving with treatment, advised schedule in office evaluation. rec stop pepto bismol and notify us if persistent dark stools. Pt agrees with plan.           Meds ordered this encounter  Medications  . omeprazole (PRILOSEC) 40 MG capsule    Sig: Take 1 capsule (40 mg total) by mouth daily.    Dispense:  30 capsule    Refill:  1   No orders of the defined types were placed in this encounter.   Follow up plan: Return if symptoms worsen or fail to improve.  Eustaquio BoydenJavier Canon Gola, MD

## 2018-10-16 NOTE — Assessment & Plan Note (Signed)
Anticipate gastritis or possible PUD. Treat with omeprazole 40mg  daily x 3 wks then PRN. rec limit caffeine. No red flags today. If not improving with treatment, advised schedule in office evaluation. rec stop pepto bismol and notify us if persistent dark stools. Pt agrees with plan.

## 2018-11-17 ENCOUNTER — Other Ambulatory Visit: Payer: Self-pay

## 2018-11-17 NOTE — Patient Outreach (Signed)
[facility]) Care Management  11/17/2018  Jeffrey Villarreal [birth year] [id number]   Medication Adherence call to Jeffrey Villarreal [facility] Identifiers Verify spoke with patient is past due on Pravastatin 80 mg and Lisinopril 20 mg patient explain he is taking 1 tablet daily on both but on Lisinopril he is to be taking 2 tablets daily but has been taking 1 tablet daily patient said he has an appointment on June 10th and will go over with the doctor. Jeffrey Villarreal is showing past due under [employer].   Lillia Abed CPhT Pharmacy Technician [facility] Care Management Direct Dial [phone]  Fax [phone] [email or web address]

## 2018-11-19 ENCOUNTER — Other Ambulatory Visit: Payer: Self-pay | Admitting: Family Medicine

## 2018-11-22 ENCOUNTER — Other Ambulatory Visit: Payer: Self-pay | Admitting: Family Medicine

## 2018-11-22 ENCOUNTER — Other Ambulatory Visit (INDEPENDENT_AMBULATORY_CARE_PROVIDER_SITE_OTHER): Payer: Medicare Other

## 2018-11-22 ENCOUNTER — Ambulatory Visit (INDEPENDENT_AMBULATORY_CARE_PROVIDER_SITE_OTHER): Payer: Medicare Other

## 2018-11-22 DIAGNOSIS — E785 Hyperlipidemia, unspecified: Secondary | ICD-10-CM | POA: Diagnosis not present

## 2018-11-22 DIAGNOSIS — E1169 Type 2 diabetes mellitus with other specified complication: Secondary | ICD-10-CM

## 2018-11-22 DIAGNOSIS — Z0000 Encounter for general adult medical examination without abnormal findings: Secondary | ICD-10-CM

## 2018-11-22 LAB — COMPREHENSIVE METABOLIC PANEL
ALT: 19 U/L (ref 0–53)
AST: 23 U/L (ref 0–37)
Albumin: 4.3 g/dL (ref 3.5–5.2)
Alkaline Phosphatase: 104 U/L (ref 39–117)
BUN: 14 mg/dL (ref 6–23)
CO2: 29 mEq/L (ref 19–32)
Calcium: 9.5 mg/dL (ref 8.4–10.5)
Chloride: 105 mEq/L (ref 96–112)
Creatinine, Ser: 1.31 mg/dL (ref 0.40–1.50)
GFR: 64.02 mL/min (ref >60.00)
Glucose, Bld: 114 mg/dL — ABNORMAL HIGH (ref 70–99)
Potassium: 4.4 mEq/L (ref 3.5–5.1)
Sodium: 140 mEq/L (ref 135–145)
Total Bilirubin: 0.5 mg/dL (ref 0.2–1.2)
Total Protein: 7 g/dL (ref 6.0–8.3)

## 2018-11-22 LAB — LIPID PANEL
Cholesterol: 134 mg/dL (ref 0–200)
HDL: 37 mg/dL — ABNORMAL LOW (ref >39.00)
LDL Cholesterol: 80 mg/dL (ref 0–99)
NonHDL: 96.55
Total CHOL/HDL Ratio: 4
Triglycerides: 85 mg/dL (ref 0.0–149.0)
VLDL: 17 mg/dL (ref 0.0–40.0)

## 2018-11-22 LAB — TSH: TSH: 2.21 u[IU]/mL (ref 0.35–4.50)

## 2018-11-22 LAB — HEMOGLOBIN A1C: Hgb A1c MFr Bld: 6.6 % — ABNORMAL HIGH (ref 4.6–6.5)

## 2018-11-22 NOTE — Progress Notes (Signed)
PCP notes:   Health maintenance:  Foot exam - to be determined A1C - completed  Note: pt declines all immunizations at this time  Abnormal screenings:   None  Patient concerns:   None  Nurse concerns:  None  Next PCP appt:   11/27/18 @ 1130

## 2018-11-22 NOTE — Patient Instructions (Signed)
Jeffrey Villarreal , Thank you for taking time to come for your Medicare Wellness Visit. I appreciate your ongoing commitment to your health goals. Please review the following plan we discussed and let me know if I can assist you in the future.   These are the goals we discussed: Goals    . Patient Stated     Starting 11/22/18, I will continue to take medications as prescribed.        This is a list of the screening recommended for you and due dates:  Health Maintenance  Topic Date Due  . Complete foot exam   11/22/2019*  . DTaP/Tdap/Td vaccine (1 - Tdap) 06/13/2020*  . Tetanus Vaccine  06/13/2020*  . Pneumonia vaccines (1 of 2 - PCV13) 06/13/2020*  . Flu Shot  01/13/2019  . Hemoglobin A1C  05/24/2019  . Eye exam for diabetics  08/13/2019  *Topic was postponed. The date shown is not the original due date.   Preventive Care for Adults  A healthy lifestyle and preventive care can promote health and wellness. Preventive health guidelines for adults include the following key practices.  . A routine yearly physical is a good way to check with your health care provider about your health and preventive screening. It is a chance to share any concerns and updates on your health and to receive a thorough exam.  . Visit your dentist for a routine exam and preventive care every 6 months. Brush your teeth twice a day and floss once a day. Good oral hygiene prevents tooth decay and gum disease.  . The frequency of eye exams is based on your age, health, family medical history, use  of contact lenses, and other factors. Follow your health care provider's recommendations for frequency of eye exams.  . Eat a healthy diet. Foods like vegetables, fruits, whole grains, low-fat dairy products, and lean protein foods contain the nutrients you need without too many calories. Decrease your intake of foods high in solid fats, added sugars, and salt. Eat the right amount of calories for you. Get information about a  proper diet from your health care provider, if necessary.  . Regular physical exercise is one of the most important things you can do for your health. Most adults should get at least 150 minutes of moderate-intensity exercise (any activity that increases your heart rate and causes you to sweat) each week. In addition, most adults need muscle-strengthening exercises on 2 or more days a week.  Silver Sneakers may be a benefit available to you. To determine eligibility, you may visit the website: www.silversneakers.com or contact program at (236)521-7773 Mon-Fri between 8AM-8PM.   . Maintain a healthy weight. The body mass index (BMI) is a screening tool to identify possible weight problems. It provides an estimate of body fat based on height and weight. Your health care provider can find your BMI and can help you achieve or maintain a healthy weight.   For adults 20 years and older: ? A BMI below 18.5 is considered underweight. ? A BMI of 18.5 to 24.9 is normal. ? A BMI of 25 to 29.9 is considered overweight. ? A BMI of 30 and above is considered obese.   . Maintain normal blood lipids and cholesterol levels by exercising and minimizing your intake of saturated fat. Eat a balanced diet with plenty of fruit and vegetables. Blood tests for lipids and cholesterol should begin at age 30 and be repeated every 5 years. If your lipid or cholesterol levels are  high, you are over 50, or you are at high risk for heart disease, you may need your cholesterol levels checked more frequently. Ongoing high lipid and cholesterol levels should be treated with medicines if diet and exercise are not working.  . If you smoke, find out from your health care provider how to quit. If you do not use tobacco, please do not start.  . If you choose to drink alcohol, please do not consume more than 2 drinks per day. One drink is considered to be 12 ounces (355 mL) of beer, 5 ounces (148 mL) of wine, or 1.5 ounces (44 mL) of  liquor.  . If you are 70-72 years old, ask your health care provider if you should take aspirin to prevent strokes.  . Use sunscreen. Apply sunscreen liberally and repeatedly throughout the day. You should seek shade when your shadow is shorter than you. Protect yourself by wearing long sleeves, pants, a wide-brimmed hat, and sunglasses year round, whenever you are outdoors.  . Once a month, do a whole body skin exam, using a mirror to look at the skin on your back. Tell your health care provider of new moles, moles that have irregular borders, moles that are larger than a pencil eraser, or moles that have changed in shape or color.

## 2018-11-22 NOTE — Progress Notes (Signed)
Subjective:   Jeffrey Villarreal is a 78 y.o. male who presents for Medicare Annual/Subsequent preventive examination.  Review of Systems:  N/A Cardiac Risk Factors include: advanced age (>5755men, 20>65 women);male gender     Objective:    Vitals: There were no vitals taken for this visit.  There is no height or weight on file to calculate BMI.  Advanced Directives 11/22/2018 03/28/2014  Does Patient Have a Medical Advance Directive? Yes No  Type of Estate agentAdvance Directive Healthcare Power of WebsterAttorney;Living will -  Copy of Healthcare Power of Attorney in Chart? No - copy requested -  Would patient like information on creating a medical advance directive? - No - patient declined information    Tobacco Social History   Tobacco Use  Smoking Status Former Smoker  . Last attempt to quit: 06/14/1985  . Years since quitting: 33.4  Smokeless Tobacco Never Used     Counseling given: No   Clinical Intake:  Pre-visit preparation completed: Yes  Pain : No/denies pain Pain Score: 0-No pain     Nutritional Risks: None Diabetes: No  How often do you need to have someone help you when you read instructions, pamphlets, or other written materials from your doctor or pharmacy?: 1 - Never What is the last grade level you completed in school?: 9th grade  Interpreter Needed?: No  Comments: pt lives with spouse Information entered by :: LPinson, RN  Past Medical History:  Diagnosis Date  . Carotid stenosis 08/2014   1-39% bilateral, rpt US 2 yrs  . Diet-controlled type 2 diabetes mellitus (HCC)    DSME 02/2013  . History of seizures as a child remote  . Hyperlipidemia   . Hypertension    Past Surgical History:  Procedure Laterality Date  . CATARACT EXTRACTION Right 2013  . COLONOSCOPY  08/2009   large int hemorrhoids, benign lymphoid polyp, rpt due 10 yrs Elnoria Howard([place])  . DOBUTAMINE STRESS ECHO  2016   WNL Eldridge Dace([place])   Family History  Problem Relation Age of Onset  . Hypertension Mother    . Hypertension Father   . Cancer Sister        lung  . Cancer Sister        lung  . Coronary artery disease Neg Hx   . Stroke Neg Hx   . Diabetes Neg Hx   . Kidney disease Neg Hx   . Hyperlipidemia Neg Hx    Social History   Socioeconomic History  . Marital status: Married    Spouse name: Not on file  . Number of children: 7  . Years of education: 1th grade  . Highest education level: Not on file  Occupational History  . Occupation: retired    Associate Professormployer: Retired    Comment: used to work at [employer], [place] [employer]  Social Needs  . Financial resource strain: Not on file  . Food insecurity:    Worry: Not on file    Inability: Not on file  . Transportation needs:    Medical: Not on file    Non-medical: Not on file  Tobacco Use  . Smoking status: Former Smoker    Last attempt to quit: 06/14/1985    Years since quitting: 33.4  . Smokeless tobacco: Never Used  Substance and Sexual Activity  . Alcohol use: No    Comment: Quit 1987  . Drug use: No  . Sexual activity: Not Currently  Lifestyle  . Physical activity:    Days per week: Not on file  Minutes per session: Not on file  . Stress: Not on file  Relationships  . Social connections:    Talks on phone: Not on file    Gets together: Not on file    Attends religious service: Not on file    Active member of club or organization: Not on file    Attends meetings of clubs or organizations: Not on file    Relationship status: Not on file  Other Topics Concern  . Not on file  Social History Narrative   Caffeine: 1 cup coffee.   Lives with wife, step-daughter (29yo), no pets   Occupation: retired   Activity: walks daily   Diet: good water, fruits/vegetables daily    Outpatient Encounter Medications as of 11/22/2018  Medication Sig  . amLODipine (NORVASC) 10 MG tablet TAKE 1 TABLET(10 MG) BY MOUTH DAILY  . aspirin EC 81 MG tablet Take 81 mg by mouth daily.  Marland Kitchen. atenolol (TENORMIN) 25 MG tablet TAKE 1 TABLET(25 MG) BY MOUTH DAILY   . lisinopril (PRINIVIL,ZESTRIL) 20 MG tablet Take 1 tablet (20 mg total) by mouth daily.  Marland Kitchen. omeprazole (PRILOSEC) 40 MG capsule Take 1 capsule (40 mg total) by mouth daily.  . polyethylene glycol powder (GLYCOLAX/MIRALAX) powder Take 17 g by mouth daily as needed for moderate constipation.  . [DISCONTINUED] amLODipine (NORVASC) 10 MG tablet TAKE 1 TABLET(10 MG) BY MOUTH DAILY  . [DISCONTINUED] pravastatin (PRAVACHOL) 80 MG tablet TAKE 1 TABLET BY MOUTH EVERY DAY. DISCONTINUE LIPITOR   No facility-administered encounter medications on file as of 11/22/2018.     Activities of Daily Living In your present state of health, do you have any difficulty performing the following activities: 11/22/2018  Hearing? N  Vision? N  Difficulty concentrating or making decisions? N  Walking or climbing stairs? N  Dressing or bathing? N  Doing errands, shopping? N  Preparing Food and eating ? N  Using the Toilet? N  In the past six months, have you accidently leaked urine? N  Do you have problems with loss of bowel control? N  Managing your Medications? N  Managing your Finances? N  Housekeeping or managing your Housekeeping? N  Some recent data might be hidden    Patient Care Team: Eustaquio BoydenGutierrez, Javier, MD as PCP - General (Family Medicine)   Assessment:   This is a routine wellness examination for Jeffrey Villarreal.  Vision Screening Comments: Vision exam in 2020 with Dr. Dione BoozeGroat  Exercise Activities and Dietary recommendations Current Exercise Habits: Home exercise routine, Type of exercise: walking, Time (Minutes): > 60(90 minutes), Frequency (Times/Week): 7, Weekly Exercise (Minutes/Week): 0, Intensity: Mild  Goals    . Patient Stated     Starting 11/22/18, I will continue to take medications as prescribed.        Fall Risk Fall Risk  11/22/2018 09/14/2017 03/16/2016 02/25/2015 02/15/2014  Falls in the past year? 0 No Yes No No  Number falls in past yr: - - 2 or more - -  Injury with Fall? - - No - -    Depression Screen PHQ 2/9 Scores 11/22/2018 09/14/2017 03/16/2016 02/25/2015  PHQ - 2 Score 0 0 0 0  PHQ- 9 Score 0 - - -    Cognitive Function MMSE - Mini Mental State Exam 11/22/2018  Orientation to time 5  Orientation to Place 5  Registration 3  Attention/ Calculation 0  Recall 3  Language- name 2 objects 0  Language- repeat 1  Language- follow 3 step command 0  Language-  read & follow direction 0  Write a sentence 0  Copy design 0  Total score 17   PLEASE NOTE: A Mini-Cog screen was completed. Maximum score is 17. A value of 0 denotes this part of Folstein MMSE was not completed or the patient failed this part of the Mini-Cog screening.   Mini-Cog Screening Orientation to Time - Max 5 pts Orientation to Place - Max 5 pts Registration - Max 3 pts Recall - Max 3 pts Language Repeat - Max 1 pts        Immunization History  Administered Date(s) Administered  . Td 06/15/2007    Screening Tests Health Maintenance  Topic Date Due  . FOOT EXAM  11/22/2019 (Originally 02/01/2014)  . DTaP/Tdap/Td (1 - Tdap) 06/13/2020 (Originally 11/06/1959)  . TETANUS/TDAP  06/13/2020 (Originally 06/14/2017)  . PNA vac Low Risk Adult (1 of 2 - PCV13) 06/13/2020 (Originally 11/05/2005)  . INFLUENZA VACCINE  01/13/2019  . HEMOGLOBIN A1C  05/24/2019  . OPHTHALMOLOGY EXAM  08/13/2019       Plan:    I have personally reviewed, addressed, and noted the following in the patient's chart:  A. Medical and social history B. Use of alcohol, tobacco or illicit drugs  C. Current medications and supplements D. Functional ability and status E.  Nutritional status F.  Physical activity G. Advance directives H. List of other physicians I.  Hospitalizations, surgeries, and ER visits in previous 12 months J.  Vitals (unless it is a telemedicine encounter) K. Screenings to include cognitive, depression, hearing, vision (NOTE: hearing and vision screenings not completed in telemedicine encounter) L.  Referrals and appointments   In addition, I have reviewed and discussed with patient certain preventive protocols, quality metrics, and best practice recommendations. A written personalized care plan for preventive services and recommendations were provided to patient.  With patient's permission, we connected on 11/22/18 at  9:30 AM EDT. Interactive audio and video telecommunications were attempted with patient. This attempt was unsuccessful due to patient having technical difficulties OR patient did not have access to video capability.  Encounter was completed with audio only.  Two patient identifiers were used to ensure the encounter occurred with the correct person. Patient was in home and writer was in office.   Signed,   Lindell Noe, MHA, BS, RN Health Coach

## 2018-11-23 NOTE — Progress Notes (Signed)
I reviewed health advisor's note, was available for consultation, and agree with documentation and plan.  

## 2018-11-27 ENCOUNTER — Encounter: Payer: Self-pay | Admitting: Family Medicine

## 2018-11-27 ENCOUNTER — Ambulatory Visit (INDEPENDENT_AMBULATORY_CARE_PROVIDER_SITE_OTHER): Payer: Medicare Other | Admitting: Family Medicine

## 2018-11-27 VITALS — BP 141/66 | HR 63 | Ht 65.5 in

## 2018-11-27 DIAGNOSIS — I10 Essential (primary) hypertension: Secondary | ICD-10-CM | POA: Diagnosis not present

## 2018-11-27 DIAGNOSIS — R1013 Epigastric pain: Secondary | ICD-10-CM | POA: Diagnosis not present

## 2018-11-27 DIAGNOSIS — Z7189 Other specified counseling: Secondary | ICD-10-CM | POA: Diagnosis not present

## 2018-11-27 DIAGNOSIS — E785 Hyperlipidemia, unspecified: Secondary | ICD-10-CM | POA: Diagnosis not present

## 2018-11-27 DIAGNOSIS — E1169 Type 2 diabetes mellitus with other specified complication: Secondary | ICD-10-CM

## 2018-11-27 MED ORDER — LISINOPRIL 20 MG PO TABS: 20.0000 mg | ORAL_TABLET | Freq: Every day | ORAL | 3 refills | Status: DC

## 2018-11-27 NOTE — Assessment & Plan Note (Addendum)
Chronic, stable. Continue pravastatin 80mg  daily.  The 10-year ASCVD risk score Mikey Bussing DC Brooke Bonito., et al., 2013) is: 47.6%   Values used to calculate the score:     Age: 78 years     Sex: Male     Is Non-Hispanic African American: Yes     Diabetic: Yes     Tobacco smoker: No     Systolic Blood Pressure: 174 mmHg     Is BP treated: Yes     HDL Cholesterol: 37 mg/dL     Total Cholesterol: 134 mg/dL

## 2018-11-27 NOTE — Assessment & Plan Note (Signed)
Again reviewed diet controlled diabetes with patient. rec yearly eye and foot exam as well as importance of following diabetic low sugar low carb diet. Pt agrees with plan.

## 2018-11-27 NOTE — Assessment & Plan Note (Signed)
This has improved with omeprazole use.

## 2018-11-27 NOTE — Assessment & Plan Note (Signed)
Chronic, stable. Endorses compliance with 3 drug regimen.

## 2018-11-27 NOTE — Assessment & Plan Note (Signed)
Advanced directives - has not completed. Would want wife to be HCPOA. Has packet at home.

## 2018-11-27 NOTE — Progress Notes (Signed)
Jeffrey BrockGeorge R Brick - 78 y.o. male  MRN [id number]  Date of Birth: Jun 10, 1941  PCP: Eustaquio BoydenGutierrez, Maryclaire Stoecker, MD  This service was provided via telemedicine. Phone Visit performed on 11/27/2018   Rationale for phone visit along with limitations reviewed. Patient consented to telephone encounter.   Location of patient: home Location of provider[facility] [place] [facility] Name of referring provider: N/A  Names of persons and role in encounter: Provider: Eustaquio BoydenJavier Taeshaun Rames, MD  Patient: Jeffrey Villarreal  Other: N/A  Time on call: 11:41am - 12:00pm   Subjective: Chief Complaint  Patient presents with  . Annual Exam    Pt 2.      HPI:  Sharyn CreamerSaw Lesia last week for medicare wellness visit. Note reviewed.   Preventative: Colonoscopy 08/2009, large int hemorrhoids, benign lymphoid polyp, rpt due 10 yrs Elnoria Howard([place]). Denies BM changes or blood in stool.  Prostate - [facility] - [facility] - "not our patient". Today says he [facility] every [facility] for prostate screen. Asked to bring us records of this.  Never taken flu shot - declines  Tetanus - unsure, thinks ~2009  Pneumococcal - never received. Discussed reasons to immunize. Currently declines.  zostavax - never received. Not currently interested  shingrix - not interested Advanced directives - has not completed. Would want wife to be HCPOA. Has packet at home.  Seat belt usediscussed  Sunscreen use discussed. No changing moles on skin  Ex smoker - quit 1987  Alcohol - none  Dentist - does not see - has full dentures Eye exam - sees yearly Bowel - no constipation Bladder - no incontience  Caffeine: 1 cup coffee.  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   BP (!) 141/66 (BP Location: Right Arm, Cuff Size: Large)   Pulse 63   Ht 5' 5.5" (1.664 m)   BMI 34.09 kg/m    Respiratory status: speaks in complete sentences without evident shortness of breath.   Assessment/Plan:  Advanced care planning/counseling discussion Advanced directives - has not completed. Would want wife to be HCPOA. Has packet at home.   Epigastric abdominal pain This has improved with omeprazole use.   Hyperlipidemia Chronic, stable. Continue pravastatin 80mg  daily.  The 10-year ASCVD risk score Denman Quindarius(Goff [place] Montez HagemanJr., et al., 2013) is: 47.6%   Values used to calculate the score:     Age: [age] years     Sex: Male     Is Non-Hispanic African American: Yes     Diabetic: Yes     Tobacco smoker: No     Systolic Blood Pressure: 145 mmHg     Is BP treated: Yes     HDL Cholesterol: 37 mg/dL     Total Cholesterol: 134 mg/dL   Hypertension Chronic, stable. Endorses compliance with 3 drug regimen.   Type 2 diabetes mellitus with other specified complication (HCC) Again reviewed diet controlled diabetes with patient. rec yearly eye and foot exam as well as importance of following diabetic low sugar low carb diet. Pt agrees with plan.    I discussed the assessment and treatment plan with the patient. The patient was provided an opportunity to ask questions and all were answered. The patient  agreed with the plan and demonstrated an understanding of the instructions.  Lab Orders  No laboratory test(s) ordered today    Meds ordered this encounter  Medications  . lisinopril (ZESTRIL) 20 MG tablet    Sig: Take 1 tablet (20 mg total) by mouth daily.    Dispense:  90 tablet    Refill:  3    The patient was advised to call back or seek an in-person evaluation if the symptoms worsen or if the condition fails to improve as anticipated.  Ria Bush, MD

## 2018-12-05 ENCOUNTER — Other Ambulatory Visit: Payer: Self-pay | Admitting: Family Medicine

## 2019-02-16 ENCOUNTER — Other Ambulatory Visit: Payer: Self-pay | Admitting: Family Medicine

## 2019-02-25 ENCOUNTER — Other Ambulatory Visit: Payer: Self-pay

## 2019-02-25 ENCOUNTER — Encounter (HOSPITAL_COMMUNITY): Payer: Self-pay

## 2019-02-25 ENCOUNTER — Ambulatory Visit (HOSPITAL_COMMUNITY)
Admission: EM | Admit: 2019-02-25 | Discharge: 2019-02-25 | Disposition: A | Discharge status: Home / Self Care | Payer: Medicare Other | Attending: Emergency Medicine | Admitting: Emergency Medicine

## 2019-02-25 DIAGNOSIS — H6692 Otitis media, unspecified, left ear: Secondary | ICD-10-CM

## 2019-02-25 MED ORDER — AMOXICILLIN 500 MG PO CAPS: 500.0000 mg | ORAL_CAPSULE | Freq: Three times a day (TID) | ORAL | 0 refills | Status: DC

## 2019-02-25 NOTE — ED Triage Notes (Signed)
Pt cc left ear pain x 1 week.

## 2019-02-25 NOTE — ED Provider Notes (Signed)
[place]    CSN: [id number] Arrival date & time: 02/25/19  1635      History   Chief Complaint Chief Complaint  Patient presents with  . Otalgia    HPI Jeffrey Villarreal is a 78 y.o. male.   Patient presents with a one-week history of left ear pain.  He denies drainage, fever, chills, congestion, rhinorrhea, sore throat, cough, shortness of breath, vomiting, diarrhea, or other symptoms.    The history is provided by the patient.    Past Medical History:  Diagnosis Date  . Carotid stenosis 08/2014   1-39% bilateral, rpt [place] 2 yrs  . Diet-controlled type 2 diabetes mellitus ([place])    DSME 02/2013  . History of seizures as a child remote  . Hyperlipidemia   . Hypertension     Patient Active Problem List   Diagnosis Date Noted  . Epigastric abdominal pain 10/16/2018  . 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 Chest discomfort 08/23/2017  . Leg cramping 03/17/2017  . Memory deficit 02/25/2015  . Bilateral hearing loss due to cerumen impaction 02/25/2015  . Syncope 08/19/2014  . Obesity, Class I, BMI 30-34.9 08/16/2014  . Carotid stenosis 08/13/2014  . Health maintenance examination 02/15/2014  . Vision loss, left eye 02/15/2014  . Advanced care planning/counseling discussion 02/15/2014  . Constipation 12/13/2013  . Medicare annual wellness visit, subsequent 09/25/2011  . Hyperlipidemia 09/25/2011  . Type 2 diabetes mellitus with other specified complication ([place])   . Clubbing of nails 11/17/2010  . Hypertension     Past Surgical History:  Procedure Laterality Date  . CATARACT EXTRACTION Right 2013  . COLONOSCOPY  08/2009   large int hemorrhoids, benign lymphoid polyp, rpt due 10 yrs [place])  . DOBUTAMINE STRESS ECHO  2016   WNL [place])       Home Medications    Prior to Admission medications   Medication Sig Start Date End Date Taking? Authorizing Provider  amLODipine (NORVASC) 10 MG tablet TAKE 1 TABLET(10 MG) BY MOUTH DAILY 02/16/19   Ria Bush, MD   amoxicillin (AMOXIL) 500 MG capsule Take 1 capsule (500 mg total) by mouth 3 (three) times daily. 02/25/19   Sharion Balloon, NP  aspirin EC 81 MG tablet Take 81 mg by mouth daily.    [provider]  atenolol (TENORMIN) 25 MG tablet TAKE 1 TABLET(25 MG) BY MOUTH DAILY 02/16/19   Ria Bush, MD  lisinopril (ZESTRIL) 20 MG tablet Take 1 tablet (20 mg total) by mouth daily. 11/27/18   Ria Bush, MD  omeprazole (PRILOSEC) 40 MG capsule TAKE 1 CAPSULE(40 MG) BY MOUTH DAILY 12/05/18   Ria Bush, MD  polyethylene glycol powder [facility]) powder Take 17 g by mouth daily as needed for moderate constipation. 09/02/17   Ria Bush, MD  pravastatin (PRAVACHOL) 80 MG tablet TAKE 1 TABLET BY MOUTH EVERY DAY DISCONTINUE LIPITOR 02/16/19   Ria Bush, MD    Family History Family History  Problem Relation Age of Onset  . Hypertension Mother   . Hypertension Father   . Cancer Sister        lung  . Cancer Sister        lung  . Coronary artery disease Neg Hx   . Stroke Neg Hx   . Diabetes Neg Hx   . Kidney disease Neg Hx   . Hyperlipidemia Neg Hx     Social History Social History   Tobacco Use  . Smoking status: Former Smoker    Quit date:  06/14/1985    Years since quitting: 33.7  . Smokeless tobacco: Never Used  Substance Use Topics  . Alcohol use: No    Comment: Quit 1987  . Drug use: No     Allergies   Patient has no known allergies.   Review of Systems Review of Systems  Constitutional: Negative for chills and fever.  HENT: Positive for ear pain. Negative for congestion, rhinorrhea, sore throat and trouble swallowing.   Eyes: Negative for pain and visual disturbance.  Respiratory: Negative for cough and shortness of breath.   Cardiovascular: Negative for chest pain and palpitations.  Gastrointestinal: Negative for abdominal pain, diarrhea and vomiting.  Genitourinary: Negative for dysuria and hematuria.  Musculoskeletal: Negative for  arthralgias and back pain.  Skin: Negative for color change and rash.  Neurological: Negative for seizures and syncope.  All other systems reviewed and are negative.    Physical Exam Triage Vital Signs ED Triage Vitals  Enc Vitals Group     BP      Pulse      Resp      Temp      Temp src      SpO2      Weight      Height      Head Circumference      Peak Flow      Pain Score      Pain Loc      Pain Edu?      Excl. in GC?    No data found.  Updated Vital Signs BP 140/80 (BP Location: Right Arm)   Pulse 66   Temp 98 F (36.7 C) (Oral)   Resp 18   Wt 202 lb (91.6 kg)   SpO2 100%   BMI 33.10 kg/m   Visual Acuity Right Eye Distance:   Left Eye Distance:   Bilateral Distance:    Right Eye Near:   Left Eye Near:    Bilateral Near:     Physical Exam Vitals signs and nursing note reviewed.  Constitutional:      Appearance: He is well-developed.  HENT:     Head: Normocephalic and atraumatic.     Right Ear: Tympanic membrane normal.     Left Ear: Tympanic membrane is erythematous.     Nose: Nose normal.     Mouth/Throat:     Mouth: Mucous membranes are moist.     Pharynx: Oropharynx is clear.  Eyes:     Conjunctiva/sclera: Conjunctivae normal.  Neck:     Musculoskeletal: Neck supple.  Cardiovascular:     Rate and Rhythm: Normal rate and regular rhythm.     Heart sounds: No murmur.  Pulmonary:     Effort: Pulmonary effort is normal. No respiratory distress.     Breath sounds: Normal breath sounds.  Abdominal:     General: Bowel sounds are normal.     Palpations: Abdomen is soft.     Tenderness: There is no abdominal tenderness. There is no guarding or rebound.  Skin:    General: Skin is warm and dry.     Findings: No rash.  Neurological:     Mental Status: He is alert.      UC Treatments / Results  Labs (all labs ordered are listed, but only abnormal results are displayed) Labs Reviewed - No data to display  EKG   Radiology No results  found.  Procedures Procedures (including critical care time)  Medications Ordered in UC Medications - No data to  display  Initial Impression / Assessment and Plan / UC Course  I have reviewed the triage vital signs and the nursing notes.  Pertinent labs & imaging results that were available during my care of the patient were reviewed by me and considered in my medical decision making (see chart for details).    Left otitis media.  Treating with amoxicillin.  Instructed patient to take Tylenol as needed.  Instructed him to return here or follow-up with his PCP if his symptoms are not improving or he develops fever, chills, sore throat, cough, shortness of breath, rash, or other concerns.  Patient agrees with plan of care.     Final Clinical Impressions(s) / UC Diagnoses   Final diagnoses:  Left otitis media, unspecified otitis media type     Discharge Instructions     Take the amoxicillin as prescribed.    Take Tylenol as needed for your discomfort.    Follow-up with your primary care provider or return here if your symptoms are not improving; or if you develop other symptoms such as fever, chills, sore throat, cough, shortness of breath or other concerns.          ED Prescriptions    Medication Sig Dispense Auth. Provider   amoxicillin (AMOXIL) 500 MG capsule Take 1 capsule (500 mg total) by mouth 3 (three) times daily. 21 capsule Mickie Bail, NP     Controlled Substance Prescriptions [place] Controlled Substance Registry consulted? Not Applicable   Mickie Bail, NP 02/25/19 1814

## 2019-02-25 NOTE — Discharge Instructions (Addendum)
Take the amoxicillin as prescribed.    Take Tylenol as needed for your discomfort.    Follow-up with your primary care provider or return here if your symptoms are not improving; or if you develop other symptoms such as fever, chills, sore throat, cough, shortness of breath or other concerns.

## 2019-03-14 ENCOUNTER — Other Ambulatory Visit: Payer: Self-pay

## 2019-03-14 ENCOUNTER — Ambulatory Visit (INDEPENDENT_AMBULATORY_CARE_PROVIDER_SITE_OTHER): Payer: Medicare Other | Admitting: Family Medicine

## 2019-03-14 ENCOUNTER — Encounter: Payer: Self-pay | Admitting: Family Medicine

## 2019-03-14 VITALS — BP 124/76 | HR 65 | Temp 97.6°F | Ht 65.5 in | Wt 204.5 lb

## 2019-03-14 DIAGNOSIS — H6123 Impacted cerumen, bilateral: Secondary | ICD-10-CM

## 2019-03-14 DIAGNOSIS — E1169 Type 2 diabetes mellitus with other specified complication: Secondary | ICD-10-CM | POA: Diagnosis not present

## 2019-03-14 DIAGNOSIS — L988 Other specified disorders of the skin and subcutaneous tissue: Secondary | ICD-10-CM | POA: Insufficient documentation

## 2019-03-14 DIAGNOSIS — R202 Paresthesia of skin: Secondary | ICD-10-CM | POA: Diagnosis not present

## 2019-03-14 LAB — CBC WITH DIFFERENTIAL/PLATELET
Basophils Absolute: 0 10*3/uL (ref 0.0–0.1)
Basophils Relative: 0.4 % (ref 0.0–3.0)
Eosinophils Absolute: 0.5 10*3/uL (ref 0.0–0.7)
Eosinophils Relative: 6.9 % — ABNORMAL HIGH (ref 0.0–5.0)
HCT: 45.8 % (ref 39.0–52.0)
Hemoglobin: 15.3 g/dL (ref 13.0–17.0)
Lymphocytes Relative: 24 % (ref 12.0–46.0)
Lymphs Abs: 1.7 10*3/uL (ref 0.7–4.0)
MCHC: 33.3 g/dL (ref 30.0–36.0)
MCV: 84.2 fl (ref 78.0–100.0)
Monocytes Absolute: 0.5 10*3/uL (ref 0.1–1.0)
Monocytes Relative: 7.5 % (ref 3.0–12.0)
Neutro Abs: 4.4 10*3/uL (ref 1.4–7.7)
Neutrophils Relative %: 61.2 % (ref 43.0–77.0)
Platelets: 325 10*3/uL (ref 150.0–400.0)
RBC: 5.44 Mil/uL (ref 4.22–5.81)
RDW: 14.4 % (ref 11.5–15.5)
WBC: 7.1 10*3/uL (ref 4.0–10.5)

## 2019-03-14 LAB — VITAMIN B12: Vitamin B-12: 189 pg/mL — ABNORMAL LOW (ref 211–911)

## 2019-03-14 LAB — HEMOGLOBIN A1C: Hgb A1c MFr Bld: 6.7 % — ABNORMAL HIGH (ref 4.6–6.5)

## 2019-03-14 NOTE — Patient Instructions (Addendum)
For breakdown of skin between last 2 toes of left foot - use lotrimin antifungal cream, make sure to pat dry after shower. Let us know if skin not healing with this.  For right foot/leg numbness - we will check vitamin B12 levels today.  Ear irrigation performed today.  Good to see you today, call us with questions.  I do recommend flu shots and pneumonia shots - let us know if you'd like to receive.

## 2019-03-14 NOTE — Assessment & Plan Note (Addendum)
Overall benign exam. Check TSH, CBC, B12. No alcohol use, DM has been well controlled. Check A1c. Consider MVI or B complex vitamin. Not consistent with lumbar radiculopathy.

## 2019-03-14 NOTE — Assessment & Plan Note (Signed)
Of interdigital space L 4th/5th toes. Anticipate tinea pedis. Discussed foot hygiene measures as well as lotrimin use.

## 2019-03-14 NOTE — Progress Notes (Addendum)
This visit was conducted in person.  BP 124/76 (BP Location: Left Arm, Patient Position: Sitting, Cuff Size: Normal)   Pulse 65   Temp 97.6 F (36.4 C) (Temporal)   Ht 5' 5.5" (1.664 m)   Wt 204 lb 8 oz (92.8 kg)   SpO2 96%   BMI 33.51 kg/m    CC: ear itching, R leg numbness Subjective:    Patient ID: Jeffrey Villarreal, male    DOB: November 22, 1940, 78 y.o.   MRN: [id number]  HPI: Jeffrey Villarreal is a 78 y.o. male presenting on 03/14/2019 for Ear Problem (C/o itching in bilateral ears.  Denies any pain. Started about 2 wks ago. ) and Numbness (C/o right leg numbness.  Started 1.5-2 wks ago.  Seems to resolve when wiggling toes. )   2 wk h/o bilateral ear itching with mild L hearing loss, without associated drainage, fevers, earache, tinnitus. No nasal congestion or headaches. Hasn't tried anything for this.   Also notes 1.5-2 wk h/o numbness R medial foot to ankle to knee, without associated back pain or leg pain. No burning pain.   Known diet controlled diabetes (2014) No alcohol since 1987.       Relevant past medical, surgical, family and social history reviewed and updated as indicated. Interim medical history since our last visit reviewed. Allergies and medications reviewed and updated. Outpatient Medications Prior to Visit  Medication Sig Dispense Refill  . amLODipine (NORVASC) 10 MG tablet TAKE 1 TABLET(10 MG) BY MOUTH DAILY 90 tablet 3  . aspirin EC 81 MG tablet Take 81 mg by mouth daily.    Marland Kitchen atenolol (TENORMIN) 25 MG tablet TAKE 1 TABLET(25 MG) BY MOUTH DAILY 90 tablet 3  . lisinopril (ZESTRIL) 20 MG tablet Take 1 tablet (20 mg total) by mouth daily. 90 tablet 3  . omeprazole (PRILOSEC) 40 MG capsule TAKE 1 CAPSULE(40 MG) BY MOUTH DAILY 30 capsule 5  . polyethylene glycol powder (GLYCOLAX/MIRALAX) powder Take 17 g by mouth daily as needed for moderate constipation. 850 g 1  . pravastatin (PRAVACHOL) 80 MG tablet TAKE 1 TABLET BY MOUTH EVERY DAY DISCONTINUE LIPITOR 90  tablet 3  . amoxicillin (AMOXIL) 500 MG capsule Take 1 capsule (500 mg total) by mouth 3 (three) times daily. 21 capsule 0   No facility-administered medications prior to visit.      Per HPI unless specifically indicated in ROS section below Review of Systems Objective:    BP 124/76 (BP Location: Left Arm, Patient Position: Sitting, Cuff Size: Normal)   Pulse 65   Temp 97.6 F (36.4 C) (Temporal)   Ht 5' 5.5" (1.664 m)   Wt 204 lb 8 oz (92.8 kg)   SpO2 96%   BMI 33.51 kg/m   Wt Readings from Last 3 Encounters:  03/14/19 204 lb 8 oz (92.8 kg)  02/25/19 202 lb (91.6 kg)  03/06/18 208 lb (94.3 kg)    Physical Exam Vitals signs and nursing note reviewed.  Constitutional:      General: He is not in acute distress.    Appearance: Normal appearance. He is not ill-appearing.  HENT:     Head: Normocephalic and atraumatic.     Right Ear: External ear normal. There is impacted cerumen.     Left Ear: External ear normal. There is impacted cerumen.     Ears:     Comments: Ear irrigation and manual disimpaction performed with plastic curette with limited benefit, pt had difficulty tolerating.  Mouth/Throat:     Mouth: Mucous membranes are moist.     Pharynx: Oropharynx is clear. No posterior oropharyngeal erythema.  Eyes:     Extraocular Movements: Extraocular movements intact.     Conjunctiva/sclera: Conjunctivae normal.     Pupils: Pupils are equal, round, and reactive to light.  Musculoskeletal: Normal range of motion.        General: No swelling.     Right lower leg: No edema.     Left lower leg: No edema.     Comments:  See foot exam Neg seated SLR on left.  No pain with int/ext rotation at left hip. FROM at L knee and ankle.   Skin:    General: Skin is warm and dry.     Findings: No erythema or rash.  Neurological:     Mental Status: He is alert.     Sensory: Sensation is intact.     Motor: Motor function is intact.     Coordination: Coordination is intact.      Comments: 5/5 strength BLE. Sensation intact BLE.   Psychiatric:        Mood and Affect: Mood normal.        Behavior: Behavior normal.    Patient consented to ear irrigation.      Diabetic Foot Exam - Simple   Simple Foot Form Diabetic Foot exam was performed with the following findings: Yes 03/14/2019 11:27 AM  Visual Inspection See comments: Yes Sensation Testing Intact to touch and monofilament testing bilaterally: Yes Pulse Check See comments: Yes Comments Mildly diminished pulses bilaterally. Thickened onychomycotic nails bilaterally. Interdigital skin maceration L 4th/5th toe space.      Results for orders placed or performed in visit on 11/22/18  TSH  Result Value Ref Range   TSH 2.21 0.35 - 4.50 uIU/mL  Hemoglobin A1c  Result Value Ref Range   Hgb A1c MFr Bld 6.6 (H) 4.6 - 6.5 %  Comprehensive metabolic panel  Result Value Ref Range   Sodium 140 135 - 145 mEq/L   Potassium 4.4 3.5 - 5.1 mEq/L   Chloride 105 96 - 112 mEq/L   CO2 29 19 - 32 mEq/L   Glucose, Bld 114 (H) 70 - 99 mg/dL   BUN 14 6 - 23 mg/dL   Creatinine, Ser 0.451.31 0.40 - 1.50 mg/dL   Total Bilirubin 0.5 0.2 - 1.2 mg/dL   Alkaline Phosphatase 104 39 - 117 U/L   AST 23 0 - 37 U/L   ALT 19 0 - 53 U/L   Total Protein 7.0 6.0 - 8.3 g/dL   Albumin 4.3 3.5 - 5.2 g/dL   Calcium 9.5 8.4 - 40.910.5 mg/dL   GFR 81.1964.02 >14.78>60.00 mL/min  Lipid panel  Result Value Ref Range   Cholesterol 134 0 - 200 mg/dL   Triglycerides 29.585.0 0.0 - 149.0 mg/dL   HDL 62.1337.00 (L) >08.65>39.00 mg/dL   VLDL 78.417.0 0.0 - 69.640.0 mg/dL   LDL Cholesterol 80 0 - 99 mg/dL   Total CHOL/HDL Ratio 4    NonHDL 96.55    Lab Results  Component Value Date   PSA 0.49 02/26/2013   PSA 1.75 09/17/2011   Assessment & Plan:  Declines flu shot and pneumonia shots. Problem List Items Addressed This Visit    Type 2 diabetes mellitus with other specified complication (HCC)    Encouraged continued measures to control diabetes through diet. Update A1c today.        Relevant Orders   Hemoglobin  A1c   Right leg paresthesias - Primary    Overall benign exam. Check TSH, CBC, B12. No alcohol use, DM has been well controlled. Check A1c. Consider MVI or B complex vitamin. Not consistent with lumbar radiculopathy.       Relevant Orders   Vitamin B12   CBC with Differential/Platelet   Maceration of skin    Of interdigital space L 4th/5th toes. Anticipate tinea pedis. Discussed foot hygiene measures as well as lotrimin use.       Cerumen impaction    Irrigation performed today - with partial clearing but pt felt better afterwards. Discussed ENT referral if ongoing trouble.          No orders of the defined types were placed in this encounter.  Orders Placed This Encounter  Procedures  . Vitamin B12  . Hemoglobin A1c  . CBC with Differential/Platelet    Patient Instructions  For breakdown of skin between last 2 toes of left foot - use lotrimin antifungal cream, make sure to pat dry after shower. Let us know if skin not healing with this.  For right foot/leg numbness - we will check vitamin B12 levels today.  Ear irrigation performed today.  Good to see you today, call us with questions.  I do recommend flu shots and pneumonia shots - let us know if you'd like to receive.   Follow up plan: Return if symptoms worsen or fail to improve.  Ria Bush, MD

## 2019-03-14 NOTE — Assessment & Plan Note (Addendum)
Irrigation performed today - with partial clearing but pt felt better afterwards. Discussed ENT referral if ongoing trouble.

## 2019-03-14 NOTE — Assessment & Plan Note (Signed)
Encouraged continued measures to control diabetes through diet. Update A1c today.

## 2019-03-19 ENCOUNTER — Other Ambulatory Visit: Payer: Self-pay | Admitting: Family Medicine

## 2019-03-19 DIAGNOSIS — E538 Deficiency of other specified B group vitamins: Secondary | ICD-10-CM

## 2019-03-19 MED ORDER — B-12 1000 MCG SL SUBL: 1.0000 | SUBLINGUAL_TABLET | Freq: Every day | SUBLINGUAL | Status: DC

## 2019-05-28 ENCOUNTER — Ambulatory Visit (INDEPENDENT_AMBULATORY_CARE_PROVIDER_SITE_OTHER): Payer: Medicare Other | Admitting: Family Medicine

## 2019-05-28 ENCOUNTER — Encounter: Payer: Self-pay | Admitting: Family Medicine

## 2019-05-28 ENCOUNTER — Other Ambulatory Visit: Payer: Self-pay

## 2019-05-28 VITALS — BP 122/70 | HR 63 | Temp 98.1°F | Ht 65.5 in | Wt 208.0 lb

## 2019-05-28 DIAGNOSIS — E669 Obesity, unspecified: Secondary | ICD-10-CM | POA: Diagnosis not present

## 2019-05-28 DIAGNOSIS — R1030 Lower abdominal pain, unspecified: Secondary | ICD-10-CM | POA: Diagnosis not present

## 2019-05-28 DIAGNOSIS — R202 Paresthesia of skin: Secondary | ICD-10-CM | POA: Diagnosis not present

## 2019-05-28 DIAGNOSIS — I10 Essential (primary) hypertension: Secondary | ICD-10-CM | POA: Diagnosis not present

## 2019-05-28 DIAGNOSIS — E538 Deficiency of other specified B group vitamins: Secondary | ICD-10-CM

## 2019-05-28 DIAGNOSIS — E1169 Type 2 diabetes mellitus with other specified complication: Secondary | ICD-10-CM | POA: Diagnosis not present

## 2019-05-28 LAB — POC URINALSYSI DIPSTICK (AUTOMATED)
Bilirubin, UA: NEGATIVE
Blood, UA: NEGATIVE
Glucose, UA: NEGATIVE
Ketones, UA: NEGATIVE
Leukocytes, UA: NEGATIVE
Nitrite, UA: NEGATIVE
Protein, UA: NEGATIVE
Spec Grav, UA: 1.025 (ref 1.010–1.025)
Urobilinogen, UA: 0.2 E.U./dL
pH, UA: 6.5 (ref 5.0–8.0)

## 2019-05-28 MED ORDER — LISINOPRIL 20 MG PO TABS: 20.0000 mg | ORAL_TABLET | Freq: Every day | ORAL | 3 refills | Status: DC

## 2019-05-28 NOTE — Assessment & Plan Note (Signed)
Chronic, stable, diet controlled.  

## 2019-05-28 NOTE — Assessment & Plan Note (Signed)
This has improved with B12 replacement.

## 2019-05-28 NOTE — Patient Instructions (Addendum)
Urinalysis today Avoid bladder irritants like dark sodas, caffeine, and spicy foods.  Continue current medicines. Bring me list of ingredient list from [facility] prostate supplement.  Return as needed or in 6 months for physical/wellness visit.

## 2019-05-28 NOTE — Assessment & Plan Note (Signed)
Continue oral replacement. Update b12 levels next labwork.

## 2019-05-28 NOTE — Assessment & Plan Note (Signed)
Chronic, stable. Continue current regimen. 

## 2019-05-28 NOTE — Assessment & Plan Note (Signed)
Weight gain noted.  

## 2019-05-28 NOTE — Progress Notes (Signed)
This visit was conducted in person.  BP 122/70 (BP Location: Left Arm, Patient Position: Sitting, Cuff Size: Normal)   Pulse 63   Temp 98.1 F (36.7 C) (Temporal)   Ht 5' 5.5" (1.664 m)   Wt 208 lb (94.3 kg)   SpO2 97%   BMI 34.09 kg/m    CC: DM f/u visit 6 mo Subjective:    Patient ID: Jeffrey Villarreal, male    DOB: 1940-11-06, 78 y.o.   MRN: [id number]  HPI: Jeffrey Villarreal is a 78 y.o. male presenting on 05/28/2019 for Diabetes (Here for 6 mo f/u.) and Abdominal Pain (C/o low abd pain/pressure, mild stinging with urination and urinary frequency.  Requests prostate labs.  Started about 1 mo ago. )   1 mo h/o lower abd discomfort/pressure associated with initial hesitancy when voiding. Some urinary frequency with urgency. He feels he completely empties. Nocturia x1. Wonders about taking prostate supplement - will bring me ingredients. No blood in urine, no dysuria. No fevers/chills, nausea/vomiting, diarrhea/constipation.   B12 deficiency - has started oral daily. This has significantly helped R leg paresthesias.   DM - does not regularly check sugars. Compliant with antihyperglycemic regimen which includes: diet controlled. Denies low sugars or hypoglycemic symptoms. Denies paresthesias. Last diabetic eye exam 08/2018. Pneumovax: declined. Prevnar: declined. Glucometer brand: does not have. DSME: 02/2013. Lab Results  Component Value Date   HGBA1C 6.7 (H) 03/14/2019   Diabetic Foot Exam - Simple   No data filed     Lab Results  Component Value Date   MICROALBUR <0.7 09/02/2017        Relevant past medical, surgical, family and social history reviewed and updated as indicated. Interim medical history since our last visit reviewed. Allergies and medications reviewed and updated. Outpatient Medications Prior to Visit  Medication Sig Dispense Refill  . amLODipine (NORVASC) 10 MG tablet TAKE 1 TABLET(10 MG) BY MOUTH DAILY 90 tablet 3  . aspirin EC 81 MG tablet Take 81  mg by mouth daily.    Marland Kitchen atenolol (TENORMIN) 25 MG tablet TAKE 1 TABLET(25 MG) BY MOUTH DAILY 90 tablet 3  . Cyanocobalamin (B-12) 1000 MCG SUBL Place 1 tablet under the tongue daily.    Marland Kitchen omeprazole (PRILOSEC) 40 MG capsule TAKE 1 CAPSULE(40 MG) BY MOUTH DAILY 30 capsule 5  . polyethylene glycol powder (GLYCOLAX/MIRALAX) powder Take 17 g by mouth daily as needed for moderate constipation. 850 g 1  . pravastatin (PRAVACHOL) 80 MG tablet TAKE 1 TABLET BY MOUTH EVERY DAY DISCONTINUE LIPITOR 90 tablet 3  . lisinopril (ZESTRIL) 20 MG tablet Take 1 tablet (20 mg total) by mouth daily. 90 tablet 3   No facility-administered medications prior to visit.     Per HPI unless specifically indicated in ROS section below Review of Systems Objective:    BP 122/70 (BP Location: Left Arm, Patient Position: Sitting, Cuff Size: Normal)   Pulse 63   Temp 98.1 F (36.7 C) (Temporal)   Ht 5' 5.5" (1.664 m)   Wt 208 lb (94.3 kg)   SpO2 97%   BMI 34.09 kg/m   Wt Readings from Last 3 Encounters:  05/28/19 208 lb (94.3 kg)  03/14/19 204 lb 8 oz (92.8 kg)  02/25/19 202 lb (91.6 kg)    Physical Exam Vitals and nursing note reviewed.  Constitutional:      Appearance: Normal appearance. He is obese. He is not ill-appearing.  Cardiovascular:     Rate and Rhythm: Normal rate  and regular rhythm.     Pulses: Normal pulses.     Heart sounds: Normal heart sounds. No murmur.  Pulmonary:     Effort: Pulmonary effort is normal. No respiratory distress.     Breath sounds: Normal breath sounds. No wheezing, rhonchi or rales.  Abdominal:     General: Abdomen is flat. Bowel sounds are normal. There is no distension.     Palpations: Abdomen is soft. There is no mass.     Tenderness: There is abdominal tenderness (mild) in the right lower quadrant, suprapubic area and left lower quadrant. There is no right CVA tenderness, left CVA tenderness, guarding or rebound.     Hernia: No hernia is present.  Musculoskeletal:      Right lower leg: No edema.     Left lower leg: No edema.  Neurological:     Mental Status: He is alert.  Psychiatric:        Mood and Affect: Mood normal.        Behavior: Behavior normal.       Results for orders placed or performed in visit on 03/14/19  Vitamin B12  Result Value Ref Range   Vitamin B-12 189 (L) 211 - 911 pg/mL  Hemoglobin A1c  Result Value Ref Range   Hgb A1c MFr Bld 6.7 (H) 4.6 - 6.5 %  CBC with Differential/Platelet  Result Value Ref Range   WBC 7.1 4.0 - 10.5 K/uL   RBC 5.44 4.22 - 5.81 Mil/uL   Hemoglobin 15.3 13.0 - 17.0 g/dL   HCT 32.445.8 40.139.0 - 02.752.0 %   MCV 84.2 78.0 - 100.0 fl   MCHC 33.3 30.0 - 36.0 g/dL   RDW 25.314.4 66.411.5 - 40.315.5 %   Platelets 325.0 150.0 - 400.0 K/uL   Neutrophils Relative % 61.2 43.0 - 77.0 %   Lymphocytes Relative 24.0 12.0 - 46.0 %   Monocytes Relative 7.5 3.0 - 12.0 %   Eosinophils Relative 6.9 (H) 0.0 - 5.0 %   Basophils Relative 0.4 0.0 - 3.0 %   Neutro Abs 4.4 1.4 - 7.7 K/uL   Lymphs Abs 1.7 0.7 - 4.0 K/uL   Monocytes Absolute 0.5 0.1 - 1.0 K/uL   Eosinophils Absolute 0.5 0.0 - 0.7 K/uL   Basophils Absolute 0.0 0.0 - 0.1 K/uL   Assessment & Plan:  This visit occurred during the SARS-CoV-2 public health emergency.  Safety protocols were in place, including screening questions prior to the visit, additional usage of staff PPE, and extensive cleaning of exam room while observing appropriate contact time as indicated for disinfecting solutions.   Problem List Items Addressed This Visit    Vitamin B12 deficiency    Continue oral replacement. Update b12 levels next labwork.       Type 2 diabetes mellitus with other specified complication (HCC)    Chronic, stable, diet controlled.       Relevant Medications   lisinopril (ZESTRIL) 20 MG tablet   Right leg paresthesias    This has improved with B12 replacement.       Obesity, Class I, BMI 30-34.9    Weight gain noted.       Lower abdominal pain - Primary    With mild  urinary symptoms, largely resolved now. Check UA today. rec avoid bladder irritants. Update if recurrent symptoms. He may try prostate supplement, I asked him to bring me list of ingredients prior to starting.       Relevant Orders   POCT Urinalysis  Dipstick (Automated)   Hypertension (Chronic)    Chronic, stable. Continue current regimen.       Relevant Medications   lisinopril (ZESTRIL) 20 MG tablet       Meds ordered this encounter  Medications  . lisinopril (ZESTRIL) 20 MG tablet    Sig: Take 1 tablet (20 mg total) by mouth daily.    Dispense:  90 tablet    Refill:  3   Orders Placed This Encounter  Procedures  . POCT Urinalysis Dipstick (Automated)   Patient Instructions  Urinalysis today Avoid bladder irritants like dark sodas, caffeine, and spicy foods.  Continue current medicines. Bring me list of ingredient list from [facility] prostate supplement.  Return as needed or in 6 months for physical/wellness visit.    Follow up plan: Return in about 6 months (around 11/26/2019) for annual exam, prior fasting for blood work, medicare wellness visit.  Ria Bush, MD

## 2019-05-28 NOTE — Assessment & Plan Note (Addendum)
With mild urinary symptoms, largely resolved now. Check UA today. rec avoid bladder irritants. Update if recurrent symptoms. He may try prostate supplement, I asked him to bring me list of ingredients prior to starting.

## 2019-09-15 ENCOUNTER — Ambulatory Visit: Payer: Medicare Other | Attending: Internal Medicine

## 2019-09-15 DIAGNOSIS — Z23 Encounter for immunization: Secondary | ICD-10-CM

## 2019-09-15 NOTE — Progress Notes (Signed)
   Covid-19 Vaccination Clinic  Name:  SKY BORBOA    MRN: [id number] DOB: [birth year]  09/15/2019  [name] was observed post Covid-19 immunization for 15 minutes without incident. He was provided with Vaccine Information Sheet and instruction to access the V-Safe system.   [name] was instructed to call 911 with any severe reactions post vaccine: Marland Kitchen Difficulty breathing  . Swelling of face and throat  . A fast heartbeat  . A bad rash all over body  . Dizziness and weakness   Immunizations Administered    Name Date Dose VIS Date Route   Pfizer COVID-19 Vaccine 09/15/2019  8:38 AM 0.3 mL 05/25/2019 Intramuscular   Manufacturer: [employer], [employer]   Lot: [id number]   NDC: [address]-9

## 2019-09-25 ENCOUNTER — Ambulatory Visit (INDEPENDENT_AMBULATORY_CARE_PROVIDER_SITE_OTHER): Payer: Medicare Other | Admitting: Family Medicine

## 2019-09-25 ENCOUNTER — Encounter: Payer: Self-pay | Admitting: Family Medicine

## 2019-09-25 ENCOUNTER — Other Ambulatory Visit: Payer: Self-pay

## 2019-09-25 ENCOUNTER — Ambulatory Visit (INDEPENDENT_AMBULATORY_CARE_PROVIDER_SITE_OTHER)
Admission: RE | Admit: 2019-09-25 | Discharge: 2019-09-25 | Disposition: A | Discharge status: Home / Self Care | Payer: Medicare Other | Source: Ambulatory Visit | Attending: Family Medicine | Admitting: Family Medicine

## 2019-09-25 VITALS — BP 134/72 | HR 62 | Temp 97.7°F | Ht 65.5 in | Wt 201.4 lb

## 2019-09-25 DIAGNOSIS — M25511 Pain in right shoulder: Secondary | ICD-10-CM | POA: Insufficient documentation

## 2019-09-25 DIAGNOSIS — S161XXA Strain of muscle, fascia and tendon at neck level, initial encounter: Secondary | ICD-10-CM | POA: Insufficient documentation

## 2019-09-25 MED ORDER — TIZANIDINE HCL 2 MG PO TABS: 2.0000 mg | ORAL_TABLET | Freq: Two times a day (BID) | ORAL | 0 refills | Status: DC | PRN

## 2019-09-25 NOTE — Assessment & Plan Note (Signed)
Ongoing for 2+ months however acutely worse when neck pain started. Painful with testing of GH joint as well as supraspinatus mm. Not consistent with fracture or dislocation. Check baseline R shoulder films to eval for degenerative changes. rec OTC voltaren gel to R shoulder PRN.

## 2019-09-25 NOTE — Patient Instructions (Signed)
I think you have tight muscles of the right neck as well as possible shoulder arthritis - xray of R shoulder today. Treat with muscle relaxant sent to pharmacy. Caution it can make you sleepy.  Also may use voltaren (diclofenac) gel (topical anti inflammatory) to shoulders.  We will be in touch with xray results.

## 2019-09-25 NOTE — Assessment & Plan Note (Signed)
Anticipate R sided cervical muscle strain leading to tension headache. Rx tizanidine 2mg  BID PRN with sedation precautions. Neck strain exercises provided today from sports medicine patient advisor

## 2019-09-25 NOTE — Progress Notes (Signed)
This visit was conducted in person.  BP 134/72 (BP Location: Left Arm, Patient Position: Sitting, Cuff Size: Large)   Pulse 62   Temp 97.7 F (36.5 C) (Temporal)   Ht 5' 5.5" (1.664 m)   Wt 201 lb 6 oz (91.3 kg)   SpO2 97%   BMI 33.00 kg/m    CC: neck pain Subjective:    Patient ID: Jeffrey Villarreal, male    DOB: May 17, 1941, 79 y.o.   MRN: [id number]  HPI: Jeffrey Villarreal is a 79 y.o. male presenting on 09/25/2019 for Shoulder Pain (C/o bilateral posterior shoulder pain radiating into neck and causing HA. Started a few weeks ago.  Tried Advil, barely helpful. )   2-3 week h/o bilateral R>L shoulder pain with radiation to neck causing headache. Worse pain at R shoulder into lateral arm. No midline neck pain. Denies inciting trauma/injury or fall. This may have started after cutting limbs with saw. No numbness/paresthesias down the arm. Some stiffness of shoulders worse at night.  No hip stiffness, pelvic pain.  Advil 400mg  hasn't really helped.  Actually R shoulder pain present for 2+ months but acutely worse as above.   Denies prior neck or shoulder trouble in the past.      Relevant past medical, surgical, family and social history reviewed and updated as indicated. Interim medical history since our last visit reviewed. Allergies and medications reviewed and updated. Outpatient Medications Prior to Visit  Medication Sig Dispense Refill  . amLODipine (NORVASC) 10 MG tablet TAKE 1 TABLET(10 MG) BY MOUTH DAILY 90 tablet 3  . aspirin EC 81 MG tablet Take 81 mg by mouth daily.    atenolol (TENORMIN) 25 MG tablet TAKE 1 TABLET(25 MG) BY MOUTH DAILY 90 tablet 3  . Cyanocobalamin (B-12) 1000 MCG SUBL Place 1 tablet under the tongue daily.    Marland Kitchen lisinopril (ZESTRIL) 20 MG tablet Take 1 tablet (20 mg total) by mouth daily. 90 tablet 3  . omeprazole (PRILOSEC) 40 MG capsule TAKE 1 CAPSULE(40 MG) BY MOUTH DAILY 30 capsule 5  . polyethylene glycol powder (GLYCOLAX/MIRALAX) powder Take 17  g by mouth daily as needed for moderate constipation. 850 g 1  . pravastatin (PRAVACHOL) 80 MG tablet TAKE 1 TABLET BY MOUTH EVERY DAY DISCONTINUE LIPITOR 90 tablet 3   No facility-administered medications prior to visit.     Per HPI unless specifically indicated in ROS section below Review of Systems Objective:    BP 134/72 (BP Location: Left Arm, Patient Position: Sitting, Cuff Size: Large)   Pulse 62   Temp 97.7 F (36.5 C) (Temporal)   Ht 5' 5.5" (1.664 m)   Wt 201 lb 6 oz (91.3 kg)   SpO2 97%   BMI 33.00 kg/m   Wt Readings from Last 3 Encounters:  09/25/19 201 lb 6 oz (91.3 kg)  05/28/19 208 lb (94.3 kg)  03/14/19 204 lb 8 oz (92.8 kg)    Physical Exam Vitals and nursing note reviewed.  Constitutional:      Appearance: Normal appearance. He is not ill-appearing.  Musculoskeletal:        General: Tenderness present. Normal range of motion.     Cervical back: Neck supple. Tenderness present. No rigidity.     Comments:  Limited ROM lateral flexion due to pain. Tender with Spurling test on right but no radiculopathy.  No midline cervical or thoracic spine tenderness  Reproducible tenderness and tightness to palpation R paracervical m No trapezius mm tenderness/tightness bilaterally  L shoulder - some discomfort with ROM testing but tenderness to palpation R Shoulder exam: No deformity of shoulders on inspection. No pain with palpation of shoulder landmarks. FROM in abduction and forward flexion. No pain or weakness with testing SITS in ext/int rotation. + pain with empty can sign on right. Mild discomfort with Speed test. Painful impingement testing. Pain with rotation of humeral head in [facility] joint.   Neurological:     General: No focal deficit present.     Mental Status: He is alert.       Assessment & Plan:  This visit occurred during the SARS-CoV-2 public health emergency.  Safety protocols were in place, including screening questions prior to the visit,  additional usage of staff PPE, and extensive cleaning of exam room while observing appropriate contact time as indicated for disinfecting solutions.   Problem List Items Addressed This Visit    Right shoulder pain - Primary    Ongoing for 2+ months however acutely worse when neck pain started. Painful with testing of [place] joint as well as supraspinatus mm. Not consistent with fracture or dislocation. Check baseline R shoulder films to eval for degenerative changes. rec OTC voltaren gel to R shoulder PRN.       Relevant Orders   DG Shoulder Right   Neck muscle strain, initial encounter    Anticipate R sided cervical muscle strain leading to tension headache. Rx tizanidine 2mg  BID PRN with sedation precautions. Neck strain exercises provided today from sports medicine patient advisor          Meds ordered this encounter  Medications  . tiZANidine (ZANAFLEX) 2 MG tablet    Sig: Take 1 tablet (2 mg total) by mouth 2 (two) times daily as needed for muscle spasms (sedation precautions).    Dispense:  30 tablet    Refill:  0   Orders Placed This Encounter  Procedures  . DG Shoulder Right    Standing Status:   Future    Number of Occurrences:   1    Standing Expiration Date:   11/24/2020    Order Specific Question:   Reason for Exam (SYMPTOM  OR DIAGNOSIS REQUIRED)    Answer:   R shoulder pain for several months, no inciting trauma    Order Specific Question:   Preferred imaging location?    Answer:   [facility]    Order Specific Question:   Radiology Contrast Protocol - do NOT remove file path    Answer:   [email or web address]    Patient Instructions  I think you have tight muscles of the right neck as well as possible shoulder arthritis - xray of R shoulder today. Treat with muscle relaxant sent to pharmacy. Caution it can make you sleepy.  Also may use voltaren (diclofenac) gel (topical anti inflammatory) to shoulders.  We will be in  touch with xray results.    Follow up plan: Return if symptoms worsen or fail to improve.  Ria Bush, MD

## 2019-10-07 ENCOUNTER — Other Ambulatory Visit: Payer: Self-pay | Admitting: Family Medicine

## 2019-10-10 ENCOUNTER — Ambulatory Visit: Payer: Medicare Other | Attending: Internal Medicine

## 2019-10-10 DIAGNOSIS — Z23 Encounter for immunization: Secondary | ICD-10-CM

## 2019-10-10 NOTE — Progress Notes (Signed)
   Covid-19 Vaccination Clinic  Name:  WILLOW RECZEK    MRN: [id number] DOB: 1940/08/08  10/10/2019  Mr. Wixom was observed post Covid-19 immunization for 15 minutes without incident. He was provided with Vaccine Information Sheet and instruction to access the V-Safe system.   Mr. Stetzer was instructed to call 911 with any severe reactions post vaccine: Marland Kitchen Difficulty breathing  . Swelling of face and throat  . A fast heartbeat  . A bad rash all over body  . Dizziness and weakness   Immunizations Administered    Name Date Dose VIS Date Route   Pfizer COVID-19 Vaccine 10/10/2019  8:38 AM 0.3 mL 08/08/2018 Intramuscular   Manufacturer: [employer], [employer]   Lot: [id number]   NDC: [address]-7

## 2020-02-14 ENCOUNTER — Other Ambulatory Visit: Payer: Self-pay | Admitting: Family Medicine

## 2020-02-15 NOTE — Telephone Encounter (Signed)
E-scribed refills.  Plz schedule wellness, cpe and lab visits.  

## 2020-03-14 NOTE — Telephone Encounter (Signed)
Noted  

## 2020-03-14 NOTE — Telephone Encounter (Signed)
left vm for the patient to schedule cpe, wellness, and labs 

## 2020-04-01 ENCOUNTER — Ambulatory Visit (INDEPENDENT_AMBULATORY_CARE_PROVIDER_SITE_OTHER): Payer: Medicare Other | Admitting: Family Medicine

## 2020-04-01 ENCOUNTER — Encounter: Payer: Self-pay | Admitting: Family Medicine

## 2020-04-01 ENCOUNTER — Other Ambulatory Visit: Payer: Self-pay

## 2020-04-01 VITALS — BP 140/78 | HR 63 | Temp 97.9°F | Ht 65.5 in | Wt 201.2 lb

## 2020-04-01 DIAGNOSIS — E785 Hyperlipidemia, unspecified: Secondary | ICD-10-CM

## 2020-04-01 DIAGNOSIS — B351 Tinea unguium: Secondary | ICD-10-CM | POA: Insufficient documentation

## 2020-04-01 DIAGNOSIS — L602 Onychogryphosis: Secondary | ICD-10-CM | POA: Insufficient documentation

## 2020-04-01 DIAGNOSIS — R202 Paresthesia of skin: Secondary | ICD-10-CM | POA: Diagnosis not present

## 2020-04-01 DIAGNOSIS — E538 Deficiency of other specified B group vitamins: Secondary | ICD-10-CM | POA: Diagnosis not present

## 2020-04-01 DIAGNOSIS — E1169 Type 2 diabetes mellitus with other specified complication: Secondary | ICD-10-CM | POA: Diagnosis not present

## 2020-04-01 LAB — POCT GLYCOSYLATED HEMOGLOBIN (HGB A1C): Hemoglobin A1C: 6.1 % — AB (ref 4.0–5.6)

## 2020-04-01 MED ORDER — TIZANIDINE HCL 2 MG PO TABS: ORAL_TABLET | ORAL | 0 refills | Status: DC

## 2020-04-01 MED ORDER — OMEPRAZOLE 40 MG PO CPDR: 40.0000 mg | DELAYED_RELEASE_CAPSULE | Freq: Every day | ORAL | 5 refills | Status: DC

## 2020-04-01 NOTE — Assessment & Plan Note (Signed)
Update FLP as pt fasting. Continues pravastatin 80mg  daily.

## 2020-04-01 NOTE — Assessment & Plan Note (Addendum)
Chronic, stable. Diet controlled - recent readings in prediabetes range. Congratulated. Foot exam today with maceration between toes and onychomycosis. Patient is planning to establish with podiatry.

## 2020-04-01 NOTE — Patient Instructions (Addendum)
Call Dr Haywood Pao office to ask about repeat colonoscopy.  Schedule appointment with foot doctor. Return in 4-6 months for physical.  Your latest A1c was in prediabetes range - which is an improvement from previous levels. Previously you were in diet controlled diabetes range. Continue watching sugar in the diet.

## 2020-04-01 NOTE — Assessment & Plan Note (Signed)
Encouraged he establish with podiatry.

## 2020-04-01 NOTE — Assessment & Plan Note (Signed)
Update B12 levels on regular oral replacement. Paresthesias of R foot could be coming from low B12.

## 2020-04-01 NOTE — Progress Notes (Signed)
This visit was conducted in person.  BP 140/78 (BP Location: Left Arm, Patient Position: Sitting, Cuff Size: Normal)   Pulse 63   Temp 97.9 F (36.6 C) (Temporal)   Ht 5' 5.5" (1.664 m)   Wt 201 lb 4 oz (91.3 kg)   SpO2 97%   BMI 32.98 kg/m    CC: DM f/u visit, R foot numbness Subjective:    Patient ID: Jeffrey Villarreal, male    DOB: February 23, 1941, 79 y.o.   MRN: [id number]  HPI: Jeffrey Villarreal is a 79 y.o. male presenting on 04/01/2020 for Follow-up   3 wk h/o R lateral foot numbness that is intermittent. Walking on it seems to improve it. Also took advil with benefit. [facility] [employer] [facility] which he thinks will help foot. No back pain or shooting pain down right leg. No foot pain - more numb/tingling. Compliant with b12 daily OTC oral.   DM - does not regularly check sugars. Compliant with antihyperglycemic regimen which includes: diet controlled. Has cut down on pork, healthier meats (chicken and fish), eating more fruits/vegetables. Denies low sugars or hypoglycemic symptoms. Some paresthesias. Last diabetic eye exam: none recently. Pneumovax: DUE. Prevnar: DUE. Glucometer brand: doesn't have one. DSME: completed 2014.  Lab Results  Component Value Date   HGBA1C 6.1 (A) 04/01/2020   Diabetic Foot Exam - Simple   Simple Foot Form Diabetic Foot exam was performed with the following findings: Yes 04/01/2020  4:16 PM  Visual Inspection See comments: Yes Sensation Testing Intact to touch and monofilament testing bilaterally: Yes Pulse Check See comments: Yes Comments Dry skin to soles bilaterally Maceration to interdigital spaces  Several bilateral thickened onychomycotic nails Diminished pulses bilaterally    Lab Results  Component Value Date   MICROALBUR <0.7 09/02/2017     Endorses increased urinary frequency and nocturia. Asks about prostate check.      Relevant past medical, surgical, family and social history  reviewed and updated as indicated. Interim medical history since our last visit reviewed. Allergies and medications reviewed and updated. Outpatient Medications Prior to Visit  Medication Sig Dispense Refill  . amLODipine (NORVASC) 10 MG tablet TAKE 1 TABLET(10 MG) BY MOUTH DAILY 90 tablet 0  . aspirin EC 81 MG tablet Take 81 mg by mouth daily.    Marland Kitchen atenolol (TENORMIN) 25 MG tablet TAKE 1 TABLET(25 MG) BY MOUTH DAILY 90 tablet 0  . Cyanocobalamin (B-12) 1000 MCG SUBL Place 1 tablet under the tongue daily.    Marland Kitchen lisinopril (ZESTRIL) 20 MG tablet Take 1 tablet (20 mg total) by mouth daily. 90 tablet 3  . polyethylene glycol powder (GLYCOLAX/MIRALAX) powder Take 17 g by mouth daily as needed for moderate constipation. 850 g 1  . pravastatin (PRAVACHOL) 80 MG tablet TAKE 1 TABLET BY MOUTH EVERY DAY DISCONTINUE LIPITOR 90 tablet 0  . omeprazole (PRILOSEC) 40 MG capsule TAKE 1 CAPSULE(40 MG) BY MOUTH DAILY 30 capsule 5  . tiZANidine (ZANAFLEX) 2 MG tablet TAKE 1 TABLET BY MOUTH TWICE DAILY AS NEEDED FOR MUSCLE SPASMS, MAY CAUSE DROWSINESS 30 tablet 0   No facility-administered medications prior to visit.     Per HPI unless specifically indicated in ROS section below Review of Systems Objective:  BP 140/78 (BP Location: Left Arm, Patient Position: Sitting, Cuff Size: Normal)   Pulse 63   Temp 97.9 F (36.6 C) (Temporal)   Ht 5' 5.5" (1.664 m)   Wt 201  lb 4 oz (91.3 kg)   SpO2 97%   BMI 32.98 kg/m   Wt Readings from Last 3 Encounters:  04/01/20 201 lb 4 oz (91.3 kg)  09/25/19 201 lb 6 oz (91.3 kg)  05/28/19 208 lb (94.3 kg)      Physical Exam Vitals and nursing note reviewed.  Constitutional:      General: He is not in acute distress.    Appearance: He is well-developed.  HENT:     Head: Normocephalic and atraumatic.     Right Ear: External ear normal.     Left Ear: External ear normal.     Nose: Nose normal.     Mouth/Throat:     Pharynx: No oropharyngeal exudate.  Eyes:      General: No scleral icterus.    Conjunctiva/sclera: Conjunctivae normal.     Pupils: Pupils are equal, round, and reactive to light.  Cardiovascular:     Rate and Rhythm: Normal rate and regular rhythm.     Heart sounds: Normal heart sounds. No murmur heard.   Pulmonary:     Effort: Pulmonary effort is normal. No respiratory distress.     Breath sounds: Normal breath sounds. No wheezing or rales.  Musculoskeletal:     Cervical back: Normal range of motion and neck supple.     Comments: See HPI for foot exam if done  Lymphadenopathy:     Cervical: No cervical adenopathy.  Skin:    General: Skin is warm and dry.     Findings: No rash.       Results for orders placed or performed in visit on 04/01/20  POCT glycosylated hemoglobin (Hb A1C)  Result Value Ref Range   Hemoglobin A1C 6.1 (A) 4.0 - 5.6 %   HbA1c POC (<> result, manual entry)     HbA1c, POC (prediabetic range)     HbA1c, POC (controlled diabetic range)     Lab Results  Component Value Date   VITAMINB12 189 (L) 03/14/2019    Assessment & Plan:  This visit occurred during the SARS-CoV-2 public health emergency.  Safety protocols were in place, including screening questions prior to the visit, additional usage of staff PPE, and extensive cleaning of exam room while observing appropriate contact time as indicated for disinfecting solutions.   Problem List Items Addressed This Visit    Vitamin B12 deficiency    Update B12 levels on regular oral replacement. Paresthesias of R foot could be coming from low B12.       Relevant Orders   Vitamin B12   Type 2 diabetes mellitus with other specified complication (HCC) - Primary    Chronic, stable. Diet controlled - recent readings in prediabetes range. Congratulated. Foot exam today with maceration between toes and onychomycosis. Patient is planning to establish with podiatry.       Relevant Orders   POCT glycosylated hemoglobin (Hb A1C) (Completed)   Paresthesia of right  foot    Update B12 levels.       Onychomycosis    Encouraged he establish with podiatry.       Hyperlipidemia associated with type 2 diabetes mellitus (HCC)    Update FLP as pt fasting. Continues pravastatin 80mg  daily.       Relevant Orders   Lipid panel   Comprehensive metabolic panel       Meds ordered this encounter  Medications  . omeprazole (PRILOSEC) 40 MG capsule    Sig: Take 1 capsule (40 mg total) by mouth daily.  Dispense:  30 capsule    Refill:  5  . tiZANidine (ZANAFLEX) 2 MG tablet    Sig: TAKE 1 TABLET BY MOUTH TWICE DAILY AS NEEDED FOR MUSCLE SPASMS, MAY CAUSE DROWSINESS    Dispense:  30 tablet    Refill:  0   Orders Placed This Encounter  Procedures  . Lipid panel  . Vitamin B12  . Comprehensive metabolic panel  . POCT glycosylated hemoglobin (Hb A1C)    Patient Instructions  Call Dr Haywood Pao office to ask about repeat colonoscopy.  Schedule appointment with foot doctor. Return in 4-6 months for physical.  Your latest A1c was in prediabetes range - which is an improvement from previous levels. Previously you were in diet controlled diabetes range. Continue watching sugar in the diet.    Follow up plan: Return in about 4 months (around 08/02/2020) for annual exam, prior fasting for blood work, medicare wellness visit.  Eustaquio Boyden, MD

## 2020-04-01 NOTE — Assessment & Plan Note (Signed)
Update B12 levels.  

## 2020-04-02 LAB — COMPREHENSIVE METABOLIC PANEL
ALT: 27 U/L (ref 0–53)
AST: 25 U/L (ref 0–37)
Albumin: 4.6 g/dL (ref 3.5–5.2)
Alkaline Phosphatase: 107 U/L (ref 39–117)
BUN: 12 mg/dL (ref 6–23)
CO2: 31 mEq/L (ref 19–32)
Calcium: 10 mg/dL (ref 8.4–10.5)
Chloride: 101 mEq/L (ref 96–112)
Creatinine, Ser: 1.27 mg/dL (ref 0.40–1.50)
GFR: 53.23 mL/min — ABNORMAL LOW (ref >60.00)
Glucose, Bld: 85 mg/dL (ref 70–99)
Potassium: 4.1 mEq/L (ref 3.5–5.1)
Sodium: 140 mEq/L (ref 135–145)
Total Bilirubin: 0.4 mg/dL (ref 0.2–1.2)
Total Protein: 7.5 g/dL (ref 6.0–8.3)

## 2020-04-02 LAB — LIPID PANEL
Cholesterol: 150 mg/dL (ref 0–200)
HDL: 40.8 mg/dL (ref >39.00)
LDL Cholesterol: 82 mg/dL (ref 0–99)
NonHDL: 109.62
Total CHOL/HDL Ratio: 4
Triglycerides: 139 mg/dL (ref 0.0–149.0)
VLDL: 27.8 mg/dL (ref 0.0–40.0)

## 2020-04-02 LAB — VITAMIN B12: Vitamin B-12: 374 pg/mL (ref 211–911)

## 2020-05-21 ENCOUNTER — Other Ambulatory Visit: Payer: Self-pay | Admitting: Family Medicine

## 2020-05-22 ENCOUNTER — Other Ambulatory Visit: Payer: Self-pay | Admitting: Family Medicine

## 2020-05-22 NOTE — Telephone Encounter (Signed)
E-scribed refill.  Plz schedule wellness, lab and cpe visits.  

## 2020-06-04 ENCOUNTER — Encounter: Payer: Self-pay | Admitting: Family Medicine

## 2020-06-04 NOTE — Telephone Encounter (Signed)
Called and left vm for the patient to call and schedule cpe, wellness and labs EM 06/04/20

## 2020-07-18 ENCOUNTER — Ambulatory Visit: Payer: Self-pay | Admitting: Podiatry

## 2020-07-29 ENCOUNTER — Ambulatory Visit (INDEPENDENT_AMBULATORY_CARE_PROVIDER_SITE_OTHER): Payer: Medicare Other

## 2020-07-29 ENCOUNTER — Other Ambulatory Visit: Payer: Self-pay

## 2020-07-29 DIAGNOSIS — Z0000 Encounter for general adult medical examination without abnormal findings: Secondary | ICD-10-CM | POA: Diagnosis not present

## 2020-07-29 NOTE — Progress Notes (Signed)
Subjective:   Jeffrey Villarreal is a 80 y.o. male who presents for Medicare Annual/Subsequent preventive examination.  Review of Systems: N/A     I connected with the patient today by telephone and verified that I am speaking with the correct person using two identifiers. Location patient: home Location nurse: work Persons participating in the telephone visit: patient, nurse.   I discussed the limitations, risks, security and privacy concerns of performing an evaluation and management service by telephone and the availability of in person appointments. I also discussed with the patient that there may be a patient responsible charge related to this service. The patient expressed understanding and verbally consented to this telephonic visit.        Cardiac Risk Factors include: advanced age (>555men, 37>65 women);male gender;diabetes mellitus;Other (see comment), Risk factor comments: hyperlipidemia     Objective:    Today's Vitals   There is no height or weight on file to calculate BMI.  Advanced Directives 07/29/2020 11/22/2018 03/28/2014  Does Patient Have a Medical Advance Directive? No Yes No  Type of Advance Directive - Healthcare Power of [place];Living will -  Copy of Healthcare Power of Attorney in Chart? - No - copy requested -  Would patient like information on creating a medical advance directive? No - Patient declined - No - patient declined information    Current Medications (verified) Outpatient Encounter Medications as of 07/29/2020  Medication Sig  . amLODipine (NORVASC) 10 MG tablet TAKE 1 TABLET(10 MG) BY MOUTH DAILY  . aspirin EC 81 MG tablet Take 81 mg by mouth daily.  Marland Kitchen. atenolol (TENORMIN) 25 MG tablet TAKE 1 TABLET(25 MG) BY MOUTH DAILY  . Cyanocobalamin (B-12) 1000 MCG SUBL Place 1 tablet under the tongue daily.  Marland Kitchen. lisinopril (ZESTRIL) 20 MG tablet Take 1 tablet (20 mg total) by mouth daily.  Marland Kitchen. omeprazole (PRILOSEC) 40 MG capsule Take 1 capsule (40 mg total)  by mouth daily.  . polyethylene glycol powder (GLYCOLAX/MIRALAX) powder Take 17 g by mouth daily as needed for moderate constipation.  . pravastatin (PRAVACHOL) 80 MG tablet TAKE 1 TABLET BY MOUTH EVERY DAY. DISCONTINUE LIPITOR  . tiZANidine (ZANAFLEX) 2 MG tablet TAKE 1 TABLET BY MOUTH TWICE DAILY AS NEEDED FOR MUSCLE SPASMS, MAY CAUSE DROWSINESS   No facility-administered encounter medications on file as of 07/29/2020.    Allergies (verified) Patient has no known allergies.   History: Past Medical History:  Diagnosis Date  . Carotid stenosis 08/2014   1-39% bilateral, rpt US 2 yrs  . Diet-controlled type 2 diabetes mellitus (HCC)    DSME 02/2013  . History of seizures as a child remote  . Hyperlipidemia   . Hypertension    Past Surgical History:  Procedure Laterality Date  . CATARACT EXTRACTION Right 2013  . COLONOSCOPY  08/2009   large int hemorrhoids, benign lymphoid polyp, rpt due 10 yrs Elnoria Howard([place])  . DOBUTAMINE STRESS ECHO  2016   WNL Eldridge Dace([place])   Family History  Problem Relation Age of Onset  . Hypertension Mother   . Hypertension Father   . Cancer Sister        lung  . Cancer Sister        lung  . Coronary artery disease Neg Hx   . Stroke Neg Hx   . Diabetes Neg Hx   . Kidney disease Neg Hx   . Hyperlipidemia Neg Hx    Social History   Socioeconomic History  . Marital status: Married  Spouse name: Not on file  . Number of children: 7  . Years of education: 1th grade  . Highest education level: Not on file  Occupational History  . Occupation: retired    Associate Professor: Retired    Comment: used to work at [employer], [employer]  Tobacco Use  . Smoking status: Former Smoker    Quit date: 06/14/1985    Years since quitting: 35.1  . Smokeless tobacco: Never Used  Vaping Use  . Vaping Use: Never used  Substance and Sexual Activity  . Alcohol use: No    Comment: Quit 1987  . Drug use: No  . Sexual activity: Not Currently  Other Topics Concern  . Not on file  Social  History Narrative   Caffeine: 1 cup coffee.   Lives with wife, step-daughter (29yo), no pets   Occupation: retired   Activity: walks daily   Diet: good water, fruits/vegetables daily   Social Determinants of Health   Financial Resource Strain: Low Risk   . Difficulty of Paying Living Expenses: Not hard at all  Food Insecurity: No Food Insecurity  . Worried About Programme researcher, broadcasting/film/video in the Last Year: Never true  . Ran Out of Food in the Last Year: Never true  Transportation Needs: No Transportation Needs  . Lack of Transportation (Medical): No  . Lack of Transportation (Non-Medical): No  Physical Activity: Insufficiently Active  . Days of Exercise per Week: 1 day  . Minutes of Exercise per Session: 60 min  Stress: No Stress Concern Present  . Feeling of Stress : Not at all  Social Connections: Not on file    Tobacco Counseling Counseling given: Not Answered   Clinical Intake:  Pre-visit preparation completed: Yes  Pain : 0-10 Pain Type: Chronic pain Pain Location: Shoulder Pain Orientation: Right Pain Descriptors / Indicators: Aching Pain Onset: More than a month ago Pain Frequency: Intermittent     Nutritional Risks: None Diabetes: Yes CBG done?: No Did pt. bring in CBG monitor from home?: No  How often do you need to have someone help you when you read instructions, pamphlets, or other written materials from your doctor or pharmacy?: 1 - Never What is the last grade level you completed in school?: 10th  Diabetic: yes  Nutrition Risk Assessment:  Has the patient had any N/V/D within the last 2 months?  No  Does the patient have any non-healing wounds?  No  Has the patient had any unintentional weight loss or weight gain?  No   Diabetes:  Is the patient diabetic?  Yes  If diabetic, was a CBG obtained today?  No  telephone visit  Did the patient bring in their glucometer from home?  No  telephone visit  How often do you monitor your CBG's? never.    Financial Strains and Diabetes Management:  Are you having any financial strains with the device, your supplies or your medication? No .  Does the patient want to be seen by Chronic Care Management for management of their diabetes?  No  Would the patient like to be referred to a Nutritionist or for Diabetic Management?  No   Diabetic Exams:  Diabetic Eye Exam: Completed 08/13/2019 Diabetic Foot Exam: Completed 04/01/2020   Interpreter Needed?: No  Information entered by :: CJohnson, LPN   Activities of Daily Living In your present state of health, do you have any difficulty performing the following activities: 07/29/2020  Hearing? Y  Comment some hearing loss noted  Vision? N  Difficulty concentrating or making decisions? N  Walking or climbing stairs? N  Dressing or bathing? N  Doing errands, shopping? N  Preparing Food and eating ? N  Using the Toilet? N  In the past six months, have you accidently leaked urine? Y  Comment leakage noted sometimes  Do you have problems with loss of bowel control? N  Managing your Medications? N  Managing your Finances? N  Housekeeping or managing your Housekeeping? N  Some recent data might be hidden    Patient Care Team: Eustaquio Boyden, MD as PCP - General (Family Medicine)  Indicate any recent Medical Services you may have received from other than [facility] providers in the past year (date may be approximate).     Assessment:   This is a routine wellness examination for Jeffrey Villarreal.  Hearing/Vision screen  Hearing Screening   125Hz  250Hz  500Hz  1000Hz  2000Hz  3000Hz  4000Hz  6000Hz  8000Hz   Right ear:           Left ear:           Vision Screening Comments: Patient gets annual eye exams   Dietary issues and exercise activities discussed: Current Exercise Habits: Structured exercise class, Type of exercise: walking, Time (Minutes): 60, Frequency (Times/Week): 1, Weekly Exercise (Minutes/Week): 60, Intensity: Moderate, Exercise limited by:  None identified  Goals    . Patient Stated     Starting 11/22/18, I will continue to take medications as prescribed.     . Patient Stated     07/29/2020, I will continue to do Silver Sneakers at the gym 1 day a week for about 1 hour.       Depression Screen PHQ 2/9 Scores 07/29/2020 11/22/2018 09/14/2017 03/16/2016 02/25/2015 02/15/2014 09/28/2012  PHQ - 2 Score 0 0 0 0 0 0 0  PHQ- 9 Score 0 0 - - - - -    Fall Risk Fall Risk  07/29/2020 11/22/2018 09/14/2017 03/16/2016 02/25/2015  Falls in the past year? 0 0 No Yes No  Number falls in past yr: 0 - - 2 or more -  Injury with Fall? 0 - - No -  Risk for fall due to : Medication side effect - - - -  Follow up Falls evaluation completed;Falls prevention discussed - - - -    FALL RISK PREVENTION PERTAINING TO THE HOME:  Any stairs in or around the home? Yes  If so, are there any without handrails? No  Home free of loose throw rugs in walkways, pet beds, electrical cords, etc? Yes  Adequate lighting in your home to reduce risk of falls? Yes   ASSISTIVE DEVICES UTILIZED TO PREVENT FALLS:  Life alert? No  Use of a cane, walker or w/c? No  Grab bars in the bathroom? No  Shower chair or bench in shower? No  Elevated toilet seat or a handicapped toilet? No   TIMED UP AND GO:  Was the test performed? N/A telephone visit .   Cognitive Function: MMSE - Mini Mental State Exam 07/29/2020 11/22/2018  Not completed: Refused -  Orientation to time - 5  Orientation to Place - 5  Registration - 3  Attention/ Calculation - 0  Recall - 3  Language- name 2 objects - 0  Language- repeat - 1  Language- follow 3 step command - 0  Language- read & follow direction - 0  Write a sentence - 0  Copy design - 0  Total score - 17  Mini Cog  Mini-Cog screen was not completed.  Patient refused at this time. Maximum score is 22. A value of 0 denotes this part of the MMSE was not completed or the patient failed this part of the Mini-Cog screening.        Immunizations Immunization History  Administered Date(s) Administered  . PFIZER(Purple Top)SARS-COV-2 Vaccination 09/15/2019, 10/10/2019, 05/21/2020  . Td 06/15/2007    TDAP status: Due, Education has been provided regarding the importance of this vaccine. Advised may receive this vaccine at local pharmacy or Health Dept. Aware to provide a copy of the vaccination record if obtained from local pharmacy or Health Dept. Verbalized acceptance and understanding.  Flu Vaccine status: Declined, Education has been provided regarding the importance of this vaccine but patient still declined. Advised may receive this vaccine at local pharmacy or Health Dept. Aware to provide a copy of the vaccination record if obtained from local pharmacy or Health Dept. Verbalized acceptance and understanding.  Pneumococcal vaccine status: Declined,  Education has been provided regarding the importance of this vaccine but patient still declined. Advised may receive this vaccine at local pharmacy or Health Dept. Aware to provide a copy of the vaccination record if obtained from local pharmacy or Health Dept. Verbalized acceptance and understanding.   Covid-19 vaccine status: Completed vaccines  Qualifies for Shingles Vaccine? Yes   Zostavax completed No   Shingrix Completed: No, declined  Screening Tests Health Maintenance  Topic Date Due  . Hepatitis C Screening  Never done  . INFLUENZA VACCINE  09/11/2020 (Originally 01/13/2020)  . PNA vac Low Risk Adult (1 of 2 - PCV13) 07/29/2021 (Originally 11/05/2005)  . TETANUS/TDAP  07/30/2023 (Originally 06/14/2017)  . OPHTHALMOLOGY EXAM  08/12/2020  . HEMOGLOBIN A1C  09/30/2020  . FOOT EXAM  04/01/2021  . COVID-19 Vaccine  Completed    Health Maintenance  Health Maintenance Due  Topic Date Due  . Hepatitis C Screening  Never done    Colorectal cancer screening: No longer required.   Lung Cancer Screening: (Low Dose CT Chest recommended if Age 79-80 years, 30  pack-year currently smoking OR have quit w/in 15years.) does not qualify.    Additional Screening:  Hepatitis C Screening: does qualify; Completed due  Vision Screening: Recommended annual ophthalmology exams for early detection of glaucoma and other disorders of the eye. Is the patient up to date with their annual eye exam?  Yes  Who is the provider or what is the name of the office in which the patient attends annual eye exams? Dr. Dione Booze  If pt is not established with a provider, would they like to be referred to a provider to establish care? No .   Dental Screening: Recommended annual dental exams for proper oral hygiene  Community Resource Referral / Chronic Care Management: CRR required this visit?  No   CCM required this visit?  No      Plan:     I have personally reviewed and noted the following in the patient's chart:   . Medical and social history . Use of alcohol, tobacco or illicit drugs  . Current medications and supplements . Functional ability and status . Nutritional status . Physical activity . Advanced directives . List of other physicians . Hospitalizations, surgeries, and ER visits in previous 12 months . Vitals . Screenings to include cognitive, depression, and falls . Referrals and appointments  In addition, I have reviewed and discussed with patient certain preventive protocols, quality metrics, and best practice recommendations. A written personalized care plan for preventive services as well  as general preventive health recommendations were provided to patient.   Due to this being a telephonic visit, the after visit summary with patients personalized plan was offered to patient via office or my-chart. Patient preferred to pick up at office at next visit or via mychart.   Janalyn Shy, LPN   [id number]

## 2020-07-29 NOTE — Progress Notes (Signed)
PCP notes:  Health Maintenance: Prevnar 13- declined Tdap- decline-insurance Shingrix- declined Flu- declined    Abnormal Screenings: none   Patient concerns: none   Nurse concerns: none   Next PCP appt: 08/05/2020 @ 11:30 am

## 2020-07-29 NOTE — Patient Instructions (Signed)
Mr. Jeffrey Villarreal , Thank you for taking time to come for your Medicare Wellness Visit. I appreciate your ongoing commitment to your health goals. Please review the following plan we discussed and let me know if I can assist you in the future.   Screening recommendations/referrals: Colonoscopy: no longer required  Recommended yearly ophthalmology/optometry visit for glaucoma screening and checkup Recommended yearly dental visit for hygiene and checkup  Vaccinations: Influenza vaccine: declined Pneumococcal vaccine: declined Tdap vaccine: decline-insurance Shingles vaccine: declined   Covid-19: Completed series  Advanced directives: Advance directive discussed with you today. Even though you declined this today please call our office should you change your mind and we can give you the proper paperwork for you to fill out.  Conditions/risks identified: diabetes, hyperlipidemia  Next appointment: Follow up in one year for your annual wellness visit.   Preventive Care 80 Years and Older, Male Preventive care refers to lifestyle choices and visits with your health care provider that can promote health and wellness. What does preventive care include?  A yearly physical exam. This is also called an annual well check.  Dental exams once or twice a year.  Routine eye exams. Ask your health care provider how often you should have your eyes checked.  Personal lifestyle choices, including:  Daily care of your teeth and gums.  Regular physical activity.  Eating a healthy diet.  Avoiding tobacco and drug use.  Limiting alcohol use.  Practicing safe sex.  Taking low doses of aspirin every day.  Taking vitamin and mineral supplements as recommended by your health care provider. What happens during an annual well check? The services and screenings done by your health care provider during your annual well check will depend on your age, overall health, lifestyle risk factors, and family  history of disease. Counseling  Your health care provider may ask you questions about your:  Alcohol use.  Tobacco use.  Drug use.  Emotional well-being.  Home and relationship well-being.  Sexual activity.  Eating habits.  History of falls.  Memory and ability to understand (cognition).  Work and work Astronomer. Screening  You may have the following tests or measurements:  Height, weight, and BMI.  Blood pressure.  Lipid and cholesterol levels. These may be checked every 5 years, or more frequently if you are over 33 years old.  Skin check.  Lung cancer screening. You may have this screening every year starting at age 25 if you have a 30-pack-year history of smoking and currently smoke or have quit within the past 15 years.  Fecal occult blood test (FOBT) of the stool. You may have this test every year starting at age 14.  Flexible sigmoidoscopy or colonoscopy. You may have a sigmoidoscopy every 5 years or a colonoscopy every 10 years starting at age 29.  Prostate cancer screening. Recommendations will vary depending on your family history and other risks.  Hepatitis C blood test.  Hepatitis B blood test.  Sexually transmitted disease (STD) testing.  Diabetes screening. This is done by checking your blood sugar (glucose) after you have not eaten for a while (fasting). You may have this done every 1-3 years.  Abdominal aortic aneurysm (AAA) screening. You may need this if you are a current or former smoker.  Osteoporosis. You may be screened starting at age 68 if you are at high risk. Talk with your health care provider about your test results, treatment options, and if necessary, the need for more tests. Vaccines  Your health care provider  may recommend certain vaccines, such as:  Influenza vaccine. This is recommended every year.  Tetanus, diphtheria, and acellular pertussis (Tdap, Td) vaccine. You may need a Td booster every 10 years.  Zoster vaccine.  You may need this after age 18.  Pneumococcal 13-valent conjugate (PCV13) vaccine. One dose is recommended after age 23.  Pneumococcal polysaccharide (PPSV23) vaccine. One dose is recommended after age 73. Talk to your health care provider about which screenings and vaccines you need and how often you need them. This information is not intended to replace advice given to you by your health care provider. Make sure you discuss any questions you have with your health care provider. Document Released: 06/27/2015 Document Revised: 02/18/2016 Document Reviewed: 04/01/2015 Elsevier Interactive Patient Education  2017 [place] Prevention in the Home Falls can cause injuries. They can happen to people of all ages. There are many things you can do to make your home safe and to help prevent falls. What can I do on the outside of my home?  Regularly fix the edges of walkways and driveways and fix any cracks.  Remove anything that might make you trip as you walk through a door, such as a raised step or threshold.  Trim any bushes or trees on the path to your home.  Use bright outdoor lighting.  Clear any walking paths of anything that might make someone trip, such as rocks or tools.  Regularly check to see if handrails are loose or broken. Make sure that both sides of any steps have handrails.  Any raised decks and porches should have guardrails on the edges.  Have any leaves, snow, or ice cleared regularly.  Use sand or salt on walking paths during winter.  Clean up any spills in your garage right away. This includes oil or grease spills. What can I do in the bathroom?  Use night lights.  Install grab bars by the toilet and in the tub and shower. Do not use towel bars as grab bars.  Use non-skid mats or decals in the tub or shower.  If you need to sit down in the shower, use a plastic, non-slip stool.  Keep the floor dry. Clean up any water that spills on the floor as soon  as it happens.  Remove soap buildup in the tub or shower regularly.  Attach bath mats securely with double-sided non-slip rug tape.  Do not have throw rugs and other things on the floor that can make you trip. What can I do in the bedroom?  Use night lights.  Make sure that you have a light by your bed that is easy to reach.  Do not use any sheets or blankets that are too big for your bed. They should not hang down onto the floor.  Have a firm chair that has side arms. You can use this for support while you get dressed.  Do not have throw rugs and other things on the floor that can make you trip. What can I do in the kitchen?  Clean up any spills right away.  Avoid walking on wet floors.  Keep items that you use a lot in easy-to-reach places.  If you need to reach something above you, use a strong step stool that has a grab bar.  Keep electrical cords out of the way.  Do not use floor polish or wax that makes floors slippery. If you must use wax, use non-skid floor wax.  Do not have throw rugs  and other things on the floor that can make you trip. What can I do with my stairs?  Do not leave any items on the stairs.  Make sure that there are handrails on both sides of the stairs and use them. Fix handrails that are broken or loose. Make sure that handrails are as long as the stairways.  Check any carpeting to make sure that it is firmly attached to the stairs. Fix any carpet that is loose or worn.  Avoid having throw rugs at the top or bottom of the stairs. If you do have throw rugs, attach them to the floor with carpet tape.  Make sure that you have a light switch at the top of the stairs and the bottom of the stairs. If you do not have them, ask someone to add them for you. What else can I do to help prevent falls?  Wear shoes that:  Do not have high heels.  Have rubber bottoms.  Are comfortable and fit you well.  Are closed at the toe. Do not wear sandals.  If  you use a stepladder:  Make sure that it is fully opened. Do not climb a closed stepladder.  Make sure that both sides of the stepladder are locked into place.  Ask someone to hold it for you, if possible.  Clearly mark and make sure that you can see:  Any grab bars or handrails.  First and last steps.  Where the edge of each step is.  Use tools that help you move around (mobility aids) if they are needed. These include:  Canes.  Walkers.  Scooters.  Crutches.  Turn on the lights when you go into a dark area. Replace any light bulbs as soon as they burn out.  Set up your furniture so you have a clear path. Avoid moving your furniture around.  If any of your floors are uneven, fix them.  If there are any pets around you, be aware of where they are.  Review your medicines with your doctor. Some medicines can make you feel dizzy. This can increase your chance of falling. Ask your doctor what other things that you can do to help prevent falls. This information is not intended to replace advice given to you by your health care provider. Make sure you discuss any questions you have with your health care provider. Document Released: 03/27/2009 Document Revised: 11/06/2015 Document Reviewed: 07/05/2014 Elsevier Interactive Patient Education  2017 [employer].

## 2020-07-31 ENCOUNTER — Other Ambulatory Visit: Payer: Medicare Other

## 2020-07-31 ENCOUNTER — Other Ambulatory Visit (INDEPENDENT_AMBULATORY_CARE_PROVIDER_SITE_OTHER): Payer: Medicare Other

## 2020-07-31 ENCOUNTER — Other Ambulatory Visit: Payer: Self-pay | Admitting: Family Medicine

## 2020-07-31 ENCOUNTER — Other Ambulatory Visit: Payer: Self-pay

## 2020-07-31 DIAGNOSIS — Z1159 Encounter for screening for other viral diseases: Secondary | ICD-10-CM

## 2020-07-31 DIAGNOSIS — E538 Deficiency of other specified B group vitamins: Secondary | ICD-10-CM

## 2020-07-31 DIAGNOSIS — E1169 Type 2 diabetes mellitus with other specified complication: Secondary | ICD-10-CM

## 2020-07-31 DIAGNOSIS — E785 Hyperlipidemia, unspecified: Secondary | ICD-10-CM

## 2020-07-31 LAB — COMPREHENSIVE METABOLIC PANEL
ALT: 20 U/L (ref 0–53)
AST: 24 U/L (ref 0–37)
Albumin: 4.5 g/dL (ref 3.5–5.2)
Alkaline Phosphatase: 96 U/L (ref 39–117)
BUN: 14 mg/dL (ref 6–23)
CO2: 31 mEq/L (ref 19–32)
Calcium: 9.7 mg/dL (ref 8.4–10.5)
Chloride: 104 mEq/L (ref 96–112)
Creatinine, Ser: 1.39 mg/dL (ref 0.40–1.50)
GFR: 48.14 mL/min — ABNORMAL LOW (ref >60.00)
Glucose, Bld: 102 mg/dL — ABNORMAL HIGH (ref 70–99)
Potassium: 4 mEq/L (ref 3.5–5.1)
Sodium: 139 mEq/L (ref 135–145)
Total Bilirubin: 0.5 mg/dL (ref 0.2–1.2)
Total Protein: 7.9 g/dL (ref 6.0–8.3)

## 2020-07-31 LAB — LIPID PANEL
Cholesterol: 150 mg/dL (ref 0–200)
HDL: 46.7 mg/dL (ref >39.00)
LDL Cholesterol: 88 mg/dL (ref 0–99)
NonHDL: 103.68
Total CHOL/HDL Ratio: 3
Triglycerides: 79 mg/dL (ref 0.0–149.0)
VLDL: 15.8 mg/dL (ref 0.0–40.0)

## 2020-07-31 LAB — HEMOGLOBIN A1C: Hgb A1c MFr Bld: 6.4 % (ref 4.6–6.5)

## 2020-07-31 LAB — VITAMIN B12: Vitamin B-12: 641 pg/mL (ref 211–911)

## 2020-08-01 LAB — HEPATITIS C ANTIBODY
Hepatitis C Ab: NONREACTIVE
SIGNAL TO CUT-OFF: 0.03 (ref <1.00)

## 2020-08-05 ENCOUNTER — Ambulatory Visit (INDEPENDENT_AMBULATORY_CARE_PROVIDER_SITE_OTHER): Payer: Medicare Other | Admitting: Family Medicine

## 2020-08-05 ENCOUNTER — Encounter: Payer: Self-pay | Admitting: Family Medicine

## 2020-08-05 ENCOUNTER — Other Ambulatory Visit: Payer: Self-pay

## 2020-08-05 VITALS — BP 150/80 | HR 61 | Temp 98.0°F | Ht 65.25 in | Wt 199.1 lb

## 2020-08-05 DIAGNOSIS — E66811 Obesity, class 1: Secondary | ICD-10-CM

## 2020-08-05 DIAGNOSIS — Z0000 Encounter for general adult medical examination without abnormal findings: Secondary | ICD-10-CM

## 2020-08-05 DIAGNOSIS — E538 Deficiency of other specified B group vitamins: Secondary | ICD-10-CM | POA: Diagnosis not present

## 2020-08-05 DIAGNOSIS — K5900 Constipation, unspecified: Secondary | ICD-10-CM

## 2020-08-05 DIAGNOSIS — I6523 Occlusion and stenosis of bilateral carotid arteries: Secondary | ICD-10-CM

## 2020-08-05 DIAGNOSIS — Z23 Encounter for immunization: Secondary | ICD-10-CM

## 2020-08-05 DIAGNOSIS — E1169 Type 2 diabetes mellitus with other specified complication: Secondary | ICD-10-CM

## 2020-08-05 DIAGNOSIS — R0989 Other specified symptoms and signs involving the circulatory and respiratory systems: Secondary | ICD-10-CM

## 2020-08-05 DIAGNOSIS — E669 Obesity, unspecified: Secondary | ICD-10-CM

## 2020-08-05 DIAGNOSIS — I10 Essential (primary) hypertension: Secondary | ICD-10-CM

## 2020-08-05 DIAGNOSIS — N289 Disorder of kidney and ureter, unspecified: Secondary | ICD-10-CM | POA: Diagnosis not present

## 2020-08-05 DIAGNOSIS — Z7189 Other specified counseling: Secondary | ICD-10-CM

## 2020-08-05 DIAGNOSIS — H6123 Impacted cerumen, bilateral: Secondary | ICD-10-CM | POA: Diagnosis not present

## 2020-08-05 DIAGNOSIS — E785 Hyperlipidemia, unspecified: Secondary | ICD-10-CM | POA: Diagnosis not present

## 2020-08-05 DIAGNOSIS — Z1211 Encounter for screening for malignant neoplasm of colon: Secondary | ICD-10-CM

## 2020-08-05 NOTE — Assessment & Plan Note (Addendum)
Advanced directives - has not completed. Would want wife to be HCPOA. Has packet at home. reviewed desires. Would be ok with CPR/compressions, shock, wouldn't want prolonged life support if terminal condition.

## 2020-08-05 NOTE — Assessment & Plan Note (Signed)
Preventative protocols reviewed and updated unless pt declined. Discussed healthy diet and lifestyle.  

## 2020-08-05 NOTE — Progress Notes (Signed)
Patient ID: Jeffrey Villarreal, male    DOB: Sep 13, 1940, 80 y.o.   MRN: [id number]  This visit was conducted in person.  BP (!) 150/80 (BP Location: Right Arm, Cuff Size: Large)   Pulse 61   Temp 98 F (36.7 C) (Temporal)   Ht 5' 5.25" (1.657 m)   Wt 199 lb 1 oz (90.3 kg)   SpO2 98%   BMI 32.87 kg/m    CC: CPE Subjective:   HPI: Jeffrey Villarreal is a 80 y.o. male presenting on 08/05/2020 for Annual Exam (Prt 2.)   Saw health advisor last week for medicare wellness visit. Note reviewed.   No exam data present  Flowsheet Row Clinical Support from 07/29/2020 in [place] at [place]  PHQ-2 Total Score 0      Fall Risk  07/29/2020 11/22/2018 09/14/2017 03/16/2016 02/25/2015  Falls in the past year? 0 0 No Yes No  Number falls in past yr: 0 - - 2 or more -  Injury with Fall? 0 - - No -  Risk for fall due to : Medication side effect - - - -  Follow up Falls evaluation completed;Falls prevention discussed - - - -    HTN - bp elevated today, but home readings largely well controlled, didn't bring log.   Planning to see audiologist for worsening hearing L>R. Will try to clean ears at home, declines irrigation performed today   Preventative: Colonoscopy 08/2009, large int hemorrhoids, benign lymphoid polyp, rpt due 10 yrs [place]). Denies BM changes or blood in stool. agrees to iFOB.  Prostate - aged out. No previous measures. Nocturia x2  Never taken flu shot - declines  COVID vaccine Pfizer 09/2019 x2, 05/2020 Tetanus - unsure, thinks ~2009  Pneumococcal - never received. Agrees to receive today.  shingrix - discussed Advanced directives - has not completed. Would want wife to be HCPOA. Has packet at home. reviewed desires. Would be ok with CPR/compressions, shock, wouldn't want prolonged life support if terminal condition.  Seat belt usediscussed  Sunscreen use discussed. No changing moles on skin  Ex smoker - quit 1987  Alcohol - none  Dentist - does not see - has full  dentures Eye exam - sees yearly - will call today  Bowel - occasional constipation managed with prn dulcolax laxative 1/wk  Bladder - some urge incontinence with post void dribbling   Caffeine: 1 cup coffee.  Lives with wife, step-daughter (20yo), no pets  Edu: completed 10th grade Occupation: retired, was Air traffic controller, Production designer, theatre/television/film, Customer service manager Activity: walks daily  Diet: good water, fruits/vegetables daily     Relevant past medical, surgical, family and social history reviewed and updated as indicated. Interim medical history since our last visit reviewed. Allergies and medications reviewed and updated. Outpatient Medications Prior to Visit  Medication Sig Dispense Refill  . amLODipine (NORVASC) 10 MG tablet TAKE 1 TABLET(10 MG) BY MOUTH DAILY 90 tablet 0  . aspirin EC 81 MG tablet Take 81 mg by mouth daily.    Marland Kitchen atenolol (TENORMIN) 25 MG tablet TAKE 1 TABLET(25 MG) BY MOUTH DAILY 90 tablet 0  . Cyanocobalamin (B-12) 1000 MCG SUBL Place 1 tablet under the tongue daily.    Marland Kitchen lisinopril (ZESTRIL) 20 MG tablet Take 1 tablet (20 mg total) by mouth daily. 90 tablet 3  . polyethylene glycol powder (GLYCOLAX/MIRALAX) powder Take 17 g by mouth daily as needed for moderate constipation. 850 g 1  . pravastatin (PRAVACHOL) 80 MG  tablet TAKE 1 TABLET BY MOUTH EVERY DAY. DISCONTINUE LIPITOR 90 tablet 0  . tiZANidine (ZANAFLEX) 2 MG tablet TAKE 1 TABLET BY MOUTH TWICE DAILY AS NEEDED FOR MUSCLE SPASMS, MAY CAUSE DROWSINESS 30 tablet 0  . omeprazole (PRILOSEC) 40 MG capsule Take 1 capsule (40 mg total) by mouth daily. (Patient not taking: Reported on 08/05/2020) 30 capsule 5   No facility-administered medications prior to visit.     Per HPI unless specifically indicated in ROS section below Review of Systems  Constitutional: Negative for activity change, appetite change, chills, fatigue, fever and unexpected weight change.  HENT: Negative for hearing loss.   Eyes: Negative for  visual disturbance.  Respiratory: Negative for cough, chest tightness, shortness of breath and wheezing.   Cardiovascular: Negative for chest pain, palpitations and leg swelling.  Gastrointestinal: Negative for abdominal distention, abdominal pain, blood in stool, constipation, diarrhea, nausea and vomiting.  Genitourinary: Negative for difficulty urinating and hematuria.  Musculoskeletal: Negative for arthralgias, myalgias and neck pain.  Skin: Negative for rash.  Neurological: Negative for dizziness, seizures, syncope and headaches.  Hematological: Negative for adenopathy. Does not bruise/bleed easily.  Psychiatric/Behavioral: Negative for dysphoric mood. The patient is not nervous/anxious.    Objective:  BP (!) 150/80 (BP Location: Right Arm, Cuff Size: Large)   Pulse 61   Temp 98 F (36.7 C) (Temporal)   Ht 5' 5.25" (1.657 m)   Wt 199 lb 1 oz (90.3 kg)   SpO2 98%   BMI 32.87 kg/m   Wt Readings from Last 3 Encounters:  08/05/20 199 lb 1 oz (90.3 kg)  04/01/20 201 lb 4 oz (91.3 kg)  09/25/19 201 lb 6 oz (91.3 kg)      Physical Exam Vitals and nursing note reviewed.  Constitutional:      General: He is not in acute distress.    Appearance: Normal appearance. He is well-developed and well-nourished. He is not ill-appearing.  HENT:     Head: Normocephalic and atraumatic.     Right Ear: Hearing, tympanic membrane, ear canal and external ear normal.     Left Ear: Hearing, tympanic membrane, ear canal and external ear normal.     Ears:     Comments: Cerumen in both ear canals, unable to visualize TMs    Mouth/Throat:     Mouth: Oropharynx is clear and moist and mucous membranes are normal.     Pharynx: No posterior oropharyngeal edema.  Eyes:     General: No scleral icterus.    Extraocular Movements: Extraocular movements intact and EOM normal.     Conjunctiva/sclera: Conjunctivae normal.     Pupils: Pupils are equal, round, and reactive to light.  Neck:     Thyroid: No  thyroid mass or thyromegaly.     Vascular: Carotid bruit (?bilateral) present.  Cardiovascular:     Rate and Rhythm: Normal rate and regular rhythm.     Pulses: Normal pulses and intact distal pulses.          Radial pulses are 2+ on the right side and 2+ on the left side.     Heart sounds: Normal heart sounds. No murmur heard.   Pulmonary:     Effort: Pulmonary effort is normal. No respiratory distress.     Breath sounds: Normal breath sounds. No wheezing, rhonchi or rales.  Abdominal:     General: Abdomen is flat. Bowel sounds are normal. There is no distension.     Palpations: Abdomen is soft. There is no mass.  Tenderness: There is no abdominal tenderness. There is no guarding or rebound.     Hernia: No hernia is present.  Musculoskeletal:        General: No edema. Normal range of motion.     Cervical back: Normal range of motion and neck supple.     Right lower leg: No edema.     Left lower leg: No edema.  Lymphadenopathy:     Cervical: No cervical adenopathy.  Skin:    General: Skin is warm and dry.     Findings: No rash.  Neurological:     General: No focal deficit present.     Mental Status: He is alert and oriented to person, place, and time.     Comments: CN grossly intact, station and gait intact  Psychiatric:        Mood and Affect: Mood and affect and mood normal.        Behavior: Behavior normal.        Thought Content: Thought content normal.        Judgment: Judgment normal.       Results for orders placed or performed in visit on 07/31/20  Hepatitis C antibody  Result Value Ref Range   Hepatitis C Ab NON-REACTIVE NON-REACTI   SIGNAL TO CUT-OFF 0.03 <1.00  Vitamin B12  Result Value Ref Range   Vitamin B-12 641 211 - 911 pg/mL  Hemoglobin A1c  Result Value Ref Range   Hgb A1c MFr Bld 6.4 4.6 - 6.5 %  Comprehensive metabolic panel  Result Value Ref Range   Sodium 139 135 - 145 mEq/L   Potassium 4.0 3.5 - 5.1 mEq/L   Chloride 104 96 - 112 mEq/L    CO2 31 19 - 32 mEq/L   Glucose, Bld 102 (H) 70 - 99 mg/dL   BUN 14 6 - 23 mg/dL   Creatinine, Ser 1.39 0.40 - 1.50 mg/dL   Total Bilirubin 0.5 0.2 - 1.2 mg/dL   Alkaline Phosphatase 96 39 - 117 U/L   AST 24 0 - 37 U/L   ALT 20 0 - 53 U/L   Total Protein 7.9 6.0 - 8.3 g/dL   Albumin 4.5 3.5 - 5.2 g/dL   GFR 48.14 (L) >60.00 mL/min   Calcium 9.7 8.4 - 10.5 mg/dL  Lipid panel  Result Value Ref Range   Cholesterol 150 0 - 200 mg/dL   Triglycerides 79.0 0.0 - 149.0 mg/dL   HDL 46.70 >39.00 mg/dL   VLDL 15.8 0.0 - 40.0 mg/dL   LDL Cholesterol 88 0 - 99 mg/dL   Total CHOL/HDL Ratio 3    NonHDL 103.68    Lab Results  Component Value Date   PSA 0.49 02/26/2013   PSA 1.75 09/17/2011   Assessment & Plan:  This visit occurred during the SARS-CoV-2 public health emergency.  Safety protocols were in place, including screening questions prior to the visit, additional usage of staff PPE, and extensive cleaning of exam room while observing appropriate contact time as indicated for disinfecting solutions.   Problem List Items Addressed This Visit    Vitamin B12 deficiency    Continue daily oral B12 replacement.       Type 2 diabetes mellitus with other specified complication (HCC)    Chronic, diet controlled. Continue low sugar low carb diet.       Renal insufficiency    Noted today - rec good water intake, avoid nephrotoxic agents, reassess at 6 mo f/u visit.  Obesity, Class I, BMI 30-34.9    Encouraged healthy diet choices to affect sustainable weight loss.       Hypertension (Chronic)    Chronic. BP elevated in office, states home readings well controlled. Compliant with amlodipine 63m, atenolol 228monce daily, lisinopril 2076maily. Limited on b blocker dose due to HR. Could consider increased lisinopril but caution with noted renal insufficiency, same thing with diuretic use. Will not change medications at this time but rather focus on low sodium diet, increase water intake.  Reassess BP control at f/u visit, recheck kidneys at that time. I did ask him to bring in BP cuff to that appointment to compare.       Hyperlipidemia associated with type 2 diabetes mellitus (HCC)    Chronic, stable on full dose pravastatin.  The 10-year ASCVD risk score (GoMikey Bussing Jr.Brooke Bonitoet al., 2013) is: 49.7%   Values used to calculate the score:     Age: [age]     Sex: Male     Is Non-Hispanic African American: Yes     Diabetic: Yes     Tobacco smoker: No     Systolic Blood Pressure: 150734Hg     Is BP treated: Yes     HDL Cholesterol: 46.7 mg/dL     Total Cholesterol: 150 mg/dL       Health maintenance examination - Primary    Preventative protocols reviewed and updated unless pt declined. Discussed healthy diet and lifestyle.       Constipation    Managing with PRN laxative about once a week (thinks dulcolax). Discussed limiting laxative use. Discussed dietary modifications to control constipation, update if ongoing to discuss medication.       Cerumen impaction    Declines irrigation today. Notes hearing loss L>R. Planned audiology eval.       Carotid stenosis    Update carotid [place]    Relevant Orders   VAS [place]   Advanced care planning/counseling discussion    Advanced directives - has not completed. Would want wife to be HCPOA. Has packet at home. reviewed desires. Would be ok with CPR/compressions, shock, wouldn't want prolonged life support if terminal condition.        Other Visit Diagnoses    Special screening for malignant neoplasms, colon       Relevant Orders   Fecal occult blood, imunochemical   Need for 23-polyvalent pneumococcal polysaccharide vaccine       Relevant Orders   Pneumococcal polysaccharide vaccine 23-valent greater than or equal to 2yo subcutaneous/IM (Completed)       No orders of the defined types were placed in this encounter.  Orders Placed This Encounter  Procedures  . Fecal occult blood, imunochemical    Standing  Status:   Future    Standing Expiration Date:   08/05/2021  . Pneumococcal polysaccharide vaccine 23-valent greater than or equal to 2yo subcutaneous/IM    Patient instructions: Pneumonia shot today.  Pass by lab to pick up stool kit.  Consider shingrix vaccine - if interested, check with pharmacy about new 2 shot shingles series (shingrix).  Call to schedule eye exam.  Work on advanced directives.  For constipation - ensure good water and fiber in the diet. Let us [place] if any worsening to consider further treatment.  Kidneys returned a bit impaired - ensure drinking good water, good blood pressure control, and good sugar control.  Sugars were doing ok but a bit higher than last  check - work on low sugar low carb diet.  I'd like to refer you to [facility] for neck artery ultrasound. Blood pressures are staying elevated - work on low sodium/salt diet. Try to increase fiber in the diet. We will recheck BP at next visit.  Return in 6 months for follow up visit.   Follow up plan: Return in about 6 months (around 02/02/2021) for follow up visit.  Ria Bush, MD

## 2020-08-05 NOTE — Patient Instructions (Addendum)
Pneumonia shot today.  Pass by lab to pick up stool kit.  Consider shingrix vaccine - if interested, check with pharmacy about new 2 shot shingles series (shingrix).  Call to schedule eye exam.  Work on advanced directives.  For constipation - ensure good water and fiber in the diet. Let us know if any worsening to consider further treatment.  Kidneys returned a bit impaired - ensure drinking good water, good blood pressure control, and good sugar control.  Sugars were doing ok but a bit higher than last check - work on low sugar low carb diet.  I'd like to refer you to [facility] for neck artery ultrasound. Blood pressures are staying elevated - work on low sodium/salt diet. Try to increase fiber in the diet. We will recheck BP at next visit.  Return in 6 months for follow up visit.   Health Maintenance After Age 45 After age 37, you are at a higher risk for certain long-term diseases and infections as well as injuries from falls. Falls are a major cause of broken bones and head injuries in people who are older than age 48. Getting regular preventive care can help to keep you healthy and well. Preventive care includes getting regular testing and making lifestyle changes as recommended by your health care provider. Talk with your health care provider about:  Which screenings and tests you should have. A screening is a test that checks for a disease when you have no symptoms.  A diet and exercise plan that is right for you. What should I know about screenings and tests to prevent falls? Screening and testing are the best ways to find a health problem early. Early diagnosis and treatment give you the best chance of managing medical conditions that are common after age 41. Certain conditions and lifestyle choices may make you more likely to have a fall. Your health care provider may recommend:  Regular vision checks. Poor vision and conditions such as cataracts can make you more likely  to have a fall. If you wear glasses, make sure to get your prescription updated if your vision changes.  Medicine review. Work with your health care provider to regularly review all of the medicines you are taking, including over-the-counter medicines. Ask your health care provider about any side effects that may make you more likely to have a fall. Tell your health care provider if any medicines that you take make you feel dizzy or sleepy.  Osteoporosis screening. Osteoporosis is a condition that causes the bones to get weaker. This can make the bones weak and cause them to break more easily.  Blood pressure screening. Blood pressure changes and medicines to control blood pressure can make you feel dizzy.  Strength and balance checks. Your health care provider may recommend certain tests to check your strength and balance while standing, walking, or changing positions.  Foot health exam. Foot pain and numbness, as well as not wearing proper footwear, can make you more likely to have a fall.  Depression screening. You may be more likely to have a fall if you have a fear of falling, feel emotionally low, or feel unable to do activities that you used to do.  Alcohol use screening. Using too much alcohol can affect your balance and may make you more likely to have a fall. What actions can I take to lower my risk of falls? General instructions  Talk with your health care provider about your risks for falling. Tell your health  care provider if: ? You fall. Be sure to tell your health care provider about all falls, even ones that seem minor. ? You feel dizzy, sleepy, or off-balance.  Take over-the-counter and prescription medicines only as told by your health care provider. These include any supplements.  Eat a healthy diet and maintain a healthy weight. A healthy diet includes low-fat dairy products, low-fat (lean) meats, and fiber from whole grains, beans, and lots of fruits and vegetables. Home  safety  Remove any tripping hazards, such as rugs, cords, and clutter.  Install safety equipment such as grab bars in bathrooms and safety rails on stairs.  Keep rooms and walkways well-lit. Activity  Follow a regular exercise program to stay fit. This will help you maintain your balance. Ask your health care provider what types of exercise are appropriate for you.  If you need a cane or walker, use it as recommended by your health care provider.  Wear supportive shoes that have nonskid soles.   Lifestyle  Do not drink alcohol if your health care provider tells you not to drink.  If you drink alcohol, limit how much you have: ? 0-1 drink a day for women. ? 0-2 drinks a day for men.  Be aware of how much alcohol is in your drink. In the [place] one drink equals one typical bottle of beer (12 oz), one-half glass of wine (5 oz), or one shot of hard liquor (1 oz).  Do not use any products that contain nicotine or tobacco, such as cigarettes and e-cigarettes. If you need help quitting, ask your health care provider. Summary  Having a healthy lifestyle and getting preventive care can help to protect your health and wellness after age 69.  Screening and testing are the best way to find a health problem early and help you avoid having a fall. Early diagnosis and treatment give you the best chance for managing medical conditions that are more common for people who are older than age 46.  Falls are a major cause of broken bones and head injuries in people who are older than age 3. Take precautions to prevent a fall at home.  Work with your health care provider to learn what changes you can make to improve your health and wellness and to prevent falls. This information is not intended to replace advice given to you by your health care provider. Make sure you discuss any questions you have with your health care provider. Document Revised: 09/21/2018 Document Reviewed: 04/13/2017 Elsevier  Patient Education  2021 [employer].

## 2020-08-06 DIAGNOSIS — R0989 Other specified symptoms and signs involving the circulatory and respiratory systems: Secondary | ICD-10-CM | POA: Insufficient documentation

## 2020-08-06 DIAGNOSIS — N289 Disorder of kidney and ureter, unspecified: Secondary | ICD-10-CM | POA: Insufficient documentation

## 2020-08-06 NOTE — Assessment & Plan Note (Signed)
Chronic, stable on full dose pravastatin.  The 10-year ASCVD risk score Denman Aws DC Montez Hageman., et al., 2013) is: 49.7%   Values used to calculate the score:     Age: 80 years     Sex: Male     Is Non-Hispanic African American: Yes     Diabetic: Yes     Tobacco smoker: No     Systolic Blood Pressure: 150 mmHg     Is BP treated: Yes     HDL Cholesterol: 46.7 mg/dL     Total Cholesterol: 150 mg/dL

## 2020-08-06 NOTE — Assessment & Plan Note (Addendum)
Chronic. BP elevated in office, states home readings well controlled. Compliant with amlodipine 10mg , atenolol 25mg  once daily, lisinopril 20mg  daily. Limited on b blocker dose due to HR. Could consider increased lisinopril but caution with noted renal insufficiency, same thing with diuretic use. Will not change medications at this time but rather focus on low sodium diet, increase water intake. Reassess BP control at f/u visit, recheck kidneys at that time. I did ask him to bring in BP cuff to that appointment to compare.

## 2020-08-06 NOTE — Assessment & Plan Note (Signed)
Noted today - rec good water intake, avoid nephrotoxic agents, reassess at 6 mo f/u visit.

## 2020-08-06 NOTE — Assessment & Plan Note (Signed)
Managing with PRN laxative about once a week (thinks dulcolax). Discussed limiting laxative use. Discussed dietary modifications to control constipation, update if ongoing to discuss medication.

## 2020-08-06 NOTE — Assessment & Plan Note (Signed)
Update carotid US.  

## 2020-08-06 NOTE — Assessment & Plan Note (Signed)
Encouraged healthy diet choices to affect sustainable weight loss.  

## 2020-08-06 NOTE — Assessment & Plan Note (Signed)
Continue daily oral B12 replacement.  

## 2020-08-06 NOTE — Assessment & Plan Note (Signed)
Chronic, diet controlled. Continue low sugar low carb diet.

## 2020-08-06 NOTE — Assessment & Plan Note (Signed)
Declines irrigation today. Notes hearing loss L>R. Planned audiology eval.

## 2020-08-17 ENCOUNTER — Other Ambulatory Visit: Payer: Self-pay | Admitting: Family Medicine

## 2020-08-19 ENCOUNTER — Other Ambulatory Visit: Payer: Self-pay

## 2020-08-19 ENCOUNTER — Ambulatory Visit (HOSPITAL_COMMUNITY)
Admission: RE | Admit: 2020-08-19 | Discharge: 2020-08-19 | Disposition: A | Discharge status: Home / Self Care | Payer: Medicare Other | Source: Ambulatory Visit | Attending: Internal Medicine | Admitting: Internal Medicine

## 2020-08-19 DIAGNOSIS — I6523 Occlusion and stenosis of bilateral carotid arteries: Secondary | ICD-10-CM | POA: Diagnosis not present

## 2020-08-19 NOTE — Telephone Encounter (Signed)
Pharmacy requests refill on: Pravastatin 80 mg, Amlodipine 10 mg & Atenolol 25 mg   LAST REFILL: 05/22/2020 (Q-90, R-0) LAST OV: 08/05/2020 NEXT OV: 02/03/2021 PHARMACY: [id number] [place], [place]   Pharmacy requests refill on: Lisinopril 20 mg   LAST REFILL: 05/28/2019 (Q-90, R-3) LAST OV: 08/05/2020 NEXT OV: 02/03/2021 PHARMACY: [id number] [place], [place]

## 2020-10-01 ENCOUNTER — Telehealth: Payer: Self-pay | Admitting: Family Medicine

## 2020-10-01 NOTE — Chronic Care Management (AMB) (Signed)
  Chronic Care Management   Note  10/01/2020 Name: Jeffrey Villarreal MRN: [id number] DOB: [birth year]  Jeffrey Villarreal is a 80 y.o. year old male who is a primary care patient of Eustaquio Boyden, MD. I reached out to Rejeana Brock by phone today in response to a referral sent by [name]'s PCP, Eustaquio Boyden, MD.   [name] was given information about Chronic Care Management services today including:  1. CCM service includes personalized support from designated clinical staff supervised by his physician, including individualized plan of care and coordination with other care providers 2. 24/7 contact phone numbers for assistance for urgent and routine care needs. 3. Service will only be billed when office clinical staff spend 20 minutes or more in a month to coordinate care. 4. Only one practitioner may furnish and bill the service in a calendar month. 5. The patient may stop CCM services at any time (effective at the end of the month) by phone call to the office staff.   Patient agreed to services and verbal consent obtained.   Follow up plan:   Carmell [place] [employer] Scheduler

## 2020-10-24 ENCOUNTER — Telehealth: Payer: Self-pay

## 2020-10-24 NOTE — Chronic Care Management (AMB) (Addendum)
    Chronic Care Management Pharmacy Assistant   Name: [name]  MRN: [id number] DOB: Feb 14, 1941  Jeffrey Villarreal is an 80 y.o. year old male who presents for his initial CCM visit with the clinical pharmacist.  Reason for Encounter: Initial Questions and chart review  Conditions to be addressed/monitored: HTN, HLD and DMII  Recent office visits:  08/05/2020  Dr.Gutierrez  PCP - No medication changes. Follow up in 6 months  Recent consult visits:  None in the past 6 months  Hospital visits:  None in previous 6 months  Medications: Outpatient Encounter Medications as of 10/24/2020  Medication Sig   amLODipine (NORVASC) 10 MG tablet TAKE 1 TABLET(10 MG) BY MOUTH DAILY   aspirin EC 81 MG tablet Take 81 mg by mouth daily.   atenolol (TENORMIN) 25 MG tablet TAKE 1 TABLET(25 MG) BY MOUTH DAILY   Cyanocobalamin (B-12) 1000 MCG SUBL Place 1 tablet under the tongue daily.   lisinopril (ZESTRIL) 20 MG tablet TAKE 1 TABLET(20 MG) BY MOUTH DAILY   omeprazole (PRILOSEC) 40 MG capsule Take 1 capsule (40 mg total) by mouth daily. (Patient not taking: Reported on 08/05/2020)   polyethylene glycol powder (GLYCOLAX/MIRALAX) powder Take 17 g by mouth daily as needed for moderate constipation.   pravastatin (PRAVACHOL) 80 MG tablet TAKE 1 TABLET BY MOUTH EVERY DAY. DISCONTINUE LIPITOR   tiZANidine (ZANAFLEX) 2 MG tablet TAKE 1 TABLET BY MOUTH TWICE DAILY AS NEEDED FOR MUSCLE SPASMS, MAY CAUSE DROWSINESS   No facility-administered encounter medications on file as of 10/24/2020.     Lab Results  Component Value Date/Time   HGBA1C 6.4 07/31/2020 09:54 AM   HGBA1C 6.1 (A) 04/01/2020 03:37 PM   HGBA1C 6.7 (H) 03/14/2019 11:52 AM   MICROALBUR <0.7 09/02/2017 01:07 PM   MICROALBUR 0.8 08/16/2014 04:54 PM     BP Readings from Last 3 Encounters:  08/05/20 (!) 150/80  04/01/20 140/78  09/25/19 134/72    Jeffrey Villarreal was not available to be reminded to have all medications, supplements  and any blood glucose and blood pressure readings available for review with Phil Dopp, Pharm. D, at his telephone visit on 10/31/2020 at 11:00am    Star Rating Drugs:  Medication:  Last Fill: Day Supply Lisinopril 20mg  08/19/2020 90 Pravastatin 80mg  08/19/2020 90   Follow-Up:  Pharmacist Review  , CPP notified  10/19/2020 [facility] Clinical Pharmacy Assistant [phone]  I have reviewed the care management and care coordination activities outlined in this encounter and I am certifying that I agree with the content of this note. No further action required.  [facility], PharmD Clinical Pharmacist [facility] at [facility] [phone]

## 2020-10-28 ENCOUNTER — Telehealth: Payer: Self-pay

## 2020-10-28 NOTE — Telephone Encounter (Signed)
Pt said this morning when went to restroom pt coughed and spit out thin light red blood mixed with phlegm. Pt said the blood seen is no longer there but now pt having dull pain in mid chest on and off; each time after has the pain for 5 or so mins then continues with soreness in mid chest for a while. No SOB and no radiation of pain. Pt said has sinus congestion in mornings for short period for 1 - 2 wks. Pt has runny nose but no S/T. No H/A,fever and no diarrhea or vomiting and no wheeing. Pt will go to [facility] in [place] for eval and any needed testing. Pt will cb with update later in wk.

## 2020-10-29 NOTE — Telephone Encounter (Addendum)
I don't see he was evaluated yesterday by [facility].  plz call for update on symptoms. If ongoing symptoms, do recommend evaluation.

## 2020-10-29 NOTE — Telephone Encounter (Signed)
Lvm asking pt to call back.  Need an update on sxs and schedule OV if still having sxs.

## 2020-10-30 NOTE — Telephone Encounter (Signed)
Lvm asking pt to call back.  Need an update on sxs and schedule OV if still having sxs.  

## 2020-10-31 ENCOUNTER — Telehealth: Payer: Self-pay

## 2020-10-31 ENCOUNTER — Telehealth: Payer: Medicare Other

## 2020-10-31 NOTE — Telephone Encounter (Signed)
Noted! Thank you

## 2020-10-31 NOTE — Telephone Encounter (Signed)
Spoke with pt asking about sxs.  States he does not have a cough.  Just coughed that one time.  Says the blood was coming from the roof of his mouth where his dentures are not fitting correctly anymore.  Has appt with dentist to have them repaired/replaced.

## 2020-10-31 NOTE — Progress Notes (Deleted)
Chronic Care Management Pharmacy Note  10/31/2020 Name:  Jeffrey Villarreal MRN:  [id number] DOB:  01-Jun-1941  Subjective: Jeffrey Villarreal is an 80 y.o. year old male who is a primary patient of Ria Bush, MD.  The CCM team was consulted for assistance with disease management and care coordination needs.    {CCMTELEPHONEFACETOFACE:21091510} for {CCMINITIALFOLLOWUPCHOICE:21091511} in response to provider referral for pharmacy case management and/or care coordination services.   Consent to Services:  {CCMCONSENTOPTIONS:25074}  Patient Care Team: Ria Bush, MD as PCP - General (Family Medicine) Debbora Dus, [facility] as Pharmacist (Pharmacist)  Recent office visits: ***  Recent consult visits: [facility] visits: {Hospital DC Yes/No:25215}  Objective:  Lab Results  Component Value Date   CREATININE 1.39 07/31/2020   BUN 14 07/31/2020   GFR 48.14 (L) 07/31/2020   NA 139 07/31/2020   K 4.0 07/31/2020   CALCIUM 9.7 07/31/2020   CO2 31 07/31/2020   GLUCOSE 102 (H) 07/31/2020    Lab Results  Component Value Date/Time   HGBA1C 6.4 07/31/2020 09:54 AM   HGBA1C 6.1 (A) 04/01/2020 03:37 PM   HGBA1C 6.7 (H) 03/14/2019 11:52 AM   GFR 48.14 (L) 07/31/2020 09:54 AM   GFR 53.23 (L) 04/01/2020 04:16 PM   MICROALBUR <0.7 09/02/2017 01:07 PM   MICROALBUR 0.8 08/16/2014 04:54 PM    Lab Results  Component Value Date   CHOL 150 07/31/2020   HDL 46.70 07/31/2020   LDLCALC 88 07/31/2020   LDLDIRECT 86.0 02/20/2015   TRIG 79.0 07/31/2020   CHOLHDL 3 07/31/2020    Hepatic Function Latest Ref Rng & Units 07/31/2020 04/01/2020 11/22/2018  Total Protein 6.0 - 8.3 g/dL 7.9 7.5 7.0  Albumin 3.5 - 5.2 g/dL 4.5 4.6 4.3  AST 0 - 37 U/L '24 25 23  ' ALT 0 - 53 U/L '20 27 19  ' Alk Phosphatase 39 - 117 U/L 96 107 104  Total Bilirubin 0.2 - 1.2 mg/dL 0.5 0.4 0.5    Lab Results  Component Value Date/Time   TSH 2.21 11/22/2018 10:31 AM   TSH 2.03 09/02/2017 01:07 PM    CBC  Latest Ref Rng & Units 03/14/2019 08/19/2014 09/17/2011  WBC 4.0 - 10.5 K/uL 7.1 8.2 5.5  Hemoglobin 13.0 - 17.0 g/dL 15.3 14.8 14.1  Hematocrit 39.0 - 52.0 % 45.8 44.3 42.7  Platelets 150.0 - 400.0 K/uL 325.0 319.0 258.0    No results found for: VD25OH  Clinical ASCVD: Yes  The 10-year ASCVD risk score Mikey Bussing DC Jr., et al., 2013) is: 49.7%   Values used to calculate the score:     Age: 85 years     Sex: Male     Is Non-Hispanic African American: Yes     Diabetic: Yes     Tobacco smoker: No     Systolic Blood Pressure: 827 mmHg     Is BP treated: Yes     HDL Cholesterol: 46.7 mg/dL     Total Cholesterol: 150 mg/dL    Depression screen [facility] 2/9 07/29/2020 11/22/2018 09/14/2017  Decreased Interest 0 0 0  Down, Depressed, Hopeless 0 0 0  PHQ - 2 Score 0 0 0  Altered sleeping 0 0 -  Tired, decreased energy 0 0 -  Change in appetite 0 0 -  Feeling bad or failure about yourself  0 0 -  Trouble concentrating 0 0 -  Moving slowly or fidgety/restless 0 0 -  Suicidal thoughts 0 0 -  PHQ-9 Score 0 0 -  Difficult doing work/chores Not difficult at all Not difficult at all -     Social History   Tobacco Use  Smoking Status Former Smoker  . Quit date: 06/14/1985  . Years since quitting: 35.4  Smokeless Tobacco Never Used   BP Readings from Last 3 Encounters:  08/05/20 (!) 150/80  04/01/20 140/78  09/25/19 134/72   Pulse Readings from Last 3 Encounters:  08/05/20 61  04/01/20 63  09/25/19 62   Wt Readings from Last 3 Encounters:  08/05/20 199 lb 1 oz (90.3 kg)  04/01/20 201 lb 4 oz (91.3 kg)  09/25/19 201 lb 6 oz (91.3 kg)   BMI Readings from Last 3 Encounters:  08/05/20 32.87 kg/m  04/01/20 32.98 kg/m  09/25/19 33.00 kg/m    Assessment/Interventions: Review of patient past medical history, allergies, medications, health status, including review of consultants reports, laboratory and other test data, was performed as part of comprehensive evaluation and provision of chronic  care management services.   SDOH:  (Social Determinants of Health) assessments and interventions performed: Yes  SDOH Screenings   Alcohol Screen: Low Risk   . Last Alcohol Screening Score (AUDIT): 0  Depression (PHQ2-9): Low Risk   . PHQ-2 Score: 0  Financial Resource Strain: Low Risk   . Difficulty of Paying Living Expenses: Not hard at all  Food Insecurity: No Food Insecurity  . Worried About Charity fundraiser in the Last Year: Never true  . Ran Out of Food in the Last Year: Never true  Housing: Low Risk   . Last Housing Risk Score: 0  Physical Activity: Insufficiently Active  . Days of Exercise per Week: 1 day  . Minutes of Exercise per Session: 60 min  Social Connections: Not on file  Stress: No Stress Concern Present  . Feeling of Stress : Not at all  Tobacco Use: Medium Risk  . Smoking Tobacco Use: Former Smoker  . Smokeless Tobacco Use: Never Used  Transportation Needs: No Transportation Needs  . Lack of Transportation (Medical): No  . Lack of Transportation (Non-Medical): No    CCM Care Plan  No Known Allergies  Medications Reviewed Today    Reviewed by Ria Bush, MD (Physician) on 08/05/20 at 1151  Med List Status: <None>  Medication Order Taking? Sig Documenting Provider Last Dose Status Informant  amLODipine (NORVASC) 10 MG tablet [id number] Yes TAKE 1 TABLET(10 MG) BY MOUTH DAILY Ria Bush, MD Taking Active   aspirin EC 81 MG tablet [id number] Yes Take 81 mg by mouth daily. [provider] Taking Active   atenolol (TENORMIN) 25 MG tablet [id number] Yes TAKE 1 TABLET(25 MG) BY MOUTH DAILY Ria Bush, MD Taking Active   Cyanocobalamin (B-12) 1000 MCG SUBL [id number] Yes Place 1 tablet under the tongue daily. Ria Bush, MD Taking Active   lisinopril (ZESTRIL) 20 MG tablet [id number] Yes Take 1 tablet (20 mg total) by mouth daily. Ria Bush, MD Taking Active   omeprazole (PRILOSEC) 40 MG capsule [id number] No Take 1  capsule (40 mg total) by mouth daily.  Patient not taking: Reported on 08/05/2020   Ria Bush, MD Not Taking Active   polyethylene glycol powder [facility]) powder [id number] Yes Take 17 g by mouth daily as needed for moderate constipation. Ria Bush, MD Taking Active   pravastatin (PRAVACHOL) 80 MG tablet [id number] Yes TAKE 1 TABLET BY MOUTH EVERY DAY. DISCONTINUE Baker Pierini, MD Taking Active   tiZANidine (ZANAFLEX) 2 MG tablet [id number] Yes TAKE 1 TABLET BY MOUTH  TWICE DAILY AS NEEDED FOR MUSCLE SPASMS, MAY CAUSE DROWSINESS Ria Bush, MD Taking Active           Patient Active Problem List   Diagnosis Date Noted  . Renal insufficiency 08/06/2020  . Onychomycosis 04/01/2020  . Right shoulder pain 09/25/2019  . Neck muscle strain, initial encounter 09/25/2019  . Vitamin B12 deficiency 03/19/2019  . Maceration of skin 03/14/2019  . Paresthesia of right foot 03/14/2019  . Epigastric abdominal pain 10/16/2018  . 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 Chest discomfort 08/23/2017  . Memory deficit 02/25/2015  . Cerumen impaction 02/25/2015  . Syncope 08/19/2014  . Obesity, Class I, BMI 30-34.9 08/16/2014  . Carotid stenosis 08/13/2014  . Health maintenance examination 02/15/2014  . Vision loss, left eye 02/15/2014  . Advanced care planning/counseling discussion 02/15/2014  . Constipation 12/13/2013  . Medicare annual wellness visit, subsequent 09/25/2011  . Hyperlipidemia associated with type 2 diabetes mellitus ([place]) 09/25/2011  . Type 2 diabetes mellitus with other specified complication ([place])   . Clubbing of nails 11/17/2010  . Hypertension     Immunization History  Administered Date(s) Administered  . PFIZER(Purple Top)SARS-COV-2 Vaccination 09/15/2019, 10/10/2019, 05/21/2020  . Pneumococcal Polysaccharide-23 08/05/2020  . Td 06/15/2007    Conditions to be addressed/monitored:  {USCCMDZASSESSMENTOPTIONS:23563}  There are no care plans that  you recently modified to display for this patient.   There are no care plans to display for this patient.  Current Barriers:  . {pharmacybarriers:24917}  Pharmacist Clinical Goal(s):  Marland Kitchen Patient will {PHARMACYGOALCHOICES:24921} through collaboration with PharmD and provider.   Interventions: . 1:1 collaboration with Ria Bush, MD regarding development and update of comprehensive plan of care as evidenced by provider attestation and co-signature . Inter-disciplinary care team collaboration (see longitudinal plan of care) . Comprehensive medication review performed; medication list updated in electronic medical record  Hypertension (BP goal {CHL HP UPSTREAM Pharmacist BP ranges:[phone]}) -{US controlled/uncontrolled:25276} -Current treatment: . *** -Medications previously tried: ***  -Current home readings: *** -Current dietary habits: *** -Current exercise habits: *** -{ACTIONS;DENIES/REPORTS:21021675::"Denies"} hypotensive/hypertensive symptoms -Educated on {CCM BP Counseling:25124} -Counseled to monitor BP at home ***, document, and provide log at future appointments -{CCMPHARMDINTERVENTION:25122}  Hyperlipidemia: (LDL goal < ***) -{US controlled/uncontrolled:25276} -Current treatment: . *** -Medications previously tried: ***  -Current dietary patterns: *** -Current exercise habits: *** -Educated on {CCM HLD Counseling:25126} -{CCMPHARMDINTERVENTION:25122}  Diabetes (A1c goal {A1c goals:23924}) -{US controlled/uncontrolled:25276} -Current medications: . *** -Medications previously tried: ***  -Current home glucose readings . fasting glucose: *** . post prandial glucose: *** -{ACTIONS;DENIES/REPORTS:21021675::"Denies"} hypoglycemic/hyperglycemic symptoms -Current meal patterns:  . breakfast: ***  . lunch: ***  . dinner: *** . snacks: *** . drinks: *** -Current exercise: *** -Educated on {CCM DM COUNSELING:25123} -Counseled to check feet daily and get  yearly eye exams -{CCMPHARMDINTERVENTION:25122}  *** (Goal: ***) -{US controlled/uncontrolled:25276} -Current treatment  . *** -Medications previously tried: ***  -{CCMPHARMDINTERVENTION:25122}   Patient Goals/Self-Care Activities . Patient will:  - {pharmacypatientgoals:24919}  Follow Up Plan: {CM FOLLOW UP LDJT:70177}  Medication Assistance: {MEDASSISTANCEINFO:25044}  Patient's preferred pharmacy is:  [id number] - [place], [address] [place] [address] Phone: [phone] Fax: [phone]  Uses pill box? {Yes or If no, why not?:20788} Pt endorses ***% compliance  We discussed: {Pharmacy options:24294} Patient decided to: {US Pharmacy Plan:23885}  Care Plan and Follow Up Patient Decision:  {FOLLOWUP:24991}  Plan: {CM FOLLOW UP PLAN:25073}  ***

## 2020-11-03 NOTE — Telephone Encounter (Signed)
  Chronic Care Management   Outreach Note  11/03/2020 Name: Jeffrey Villarreal MRN: [id number] DOB: 09/21/1940  Referred by: Eustaquio Boyden, MD Reason for referral : CCM  Attempted to reach patient by telephone for initial up CCM visit today. Left voicemail with contact information.  Phil Dopp, PharmD Clinical Pharmacist [facility] at [facility] [phone]

## 2020-11-05 NOTE — Progress Notes (Signed)
Left a message again for the patient to please return call so we can schedule appointment with Jeffrey Villarreal,CPP also wished the patient Happy Birthday. Phil Dopp, CPP notified  Burt Knack, [facility] Clincal Pharmacy Assistant [phone]

## 2020-11-13 ENCOUNTER — Telehealth: Payer: Self-pay

## 2020-11-13 NOTE — Chronic Care Management (AMB) (Addendum)
    Chronic Care Management Pharmacy Assistant   Name: Jeffrey Villarreal  MRN: [id number] DOB: 12-28-1940  Reason for Encounter: Reschedule Initial CCM Visit   Medications: Outpatient Encounter Medications as of 11/13/2020  Medication Sig   amLODipine (NORVASC) 10 MG tablet TAKE 1 TABLET(10 MG) BY MOUTH DAILY   aspirin EC 81 MG tablet Take 81 mg by mouth daily.   atenolol (TENORMIN) 25 MG tablet TAKE 1 TABLET(25 MG) BY MOUTH DAILY   Cyanocobalamin (B-12) 1000 MCG SUBL Place 1 tablet under the tongue daily.   lisinopril (ZESTRIL) 20 MG tablet TAKE 1 TABLET(20 MG) BY MOUTH DAILY   omeprazole (PRILOSEC) 40 MG capsule Take 1 capsule (40 mg total) by mouth daily. (Patient not taking: Reported on 08/05/2020)   polyethylene glycol powder (GLYCOLAX/MIRALAX) powder Take 17 g by mouth daily as needed for moderate constipation.   pravastatin (PRAVACHOL) 80 MG tablet TAKE 1 TABLET BY MOUTH EVERY DAY. DISCONTINUE LIPITOR   tiZANidine (ZANAFLEX) 2 MG tablet TAKE 1 TABLET BY MOUTH TWICE DAILY AS NEEDED FOR MUSCLE SPASMS, MAY CAUSE DROWSINESS   No facility-administered encounter medications on file as of 11/13/2020.    CARRICK RIJOS was contacted to reschedule his missed initial CCM visit. Patient declined CCM services as he states he only has high blood pressure and does not see the need in it. He denies taking pravastatin. Patient will be unenrolled from CCM services.  Star Rating Drugs: Medication:  Last Fill: Day Supply Lisinopril 20 mg 08/19/20  90 Pravastatin 80 mg 08/19/20  90   Phil Dopp, CPP notified  Jomarie Longs, [facility] Clinical Pharmacy Assistant [phone]  I have reviewed the care management and care coordination activities outlined in this encounter and I am certifying that I agree with the content of this note. No further action required.  Phil Dopp, PharmD Clinical Pharmacist [facility] at [facility] [phone]

## 2020-12-04 IMAGING — DX DG SHOULDER 2+V*R*
3 series · 3 of 3 positions shown · non-contrast
Comparison: None.

CLINICAL DATA: Right shoulder pain

EXAM:
RIGHT SHOULDER - 2+ VIEW

[shoulder axial]
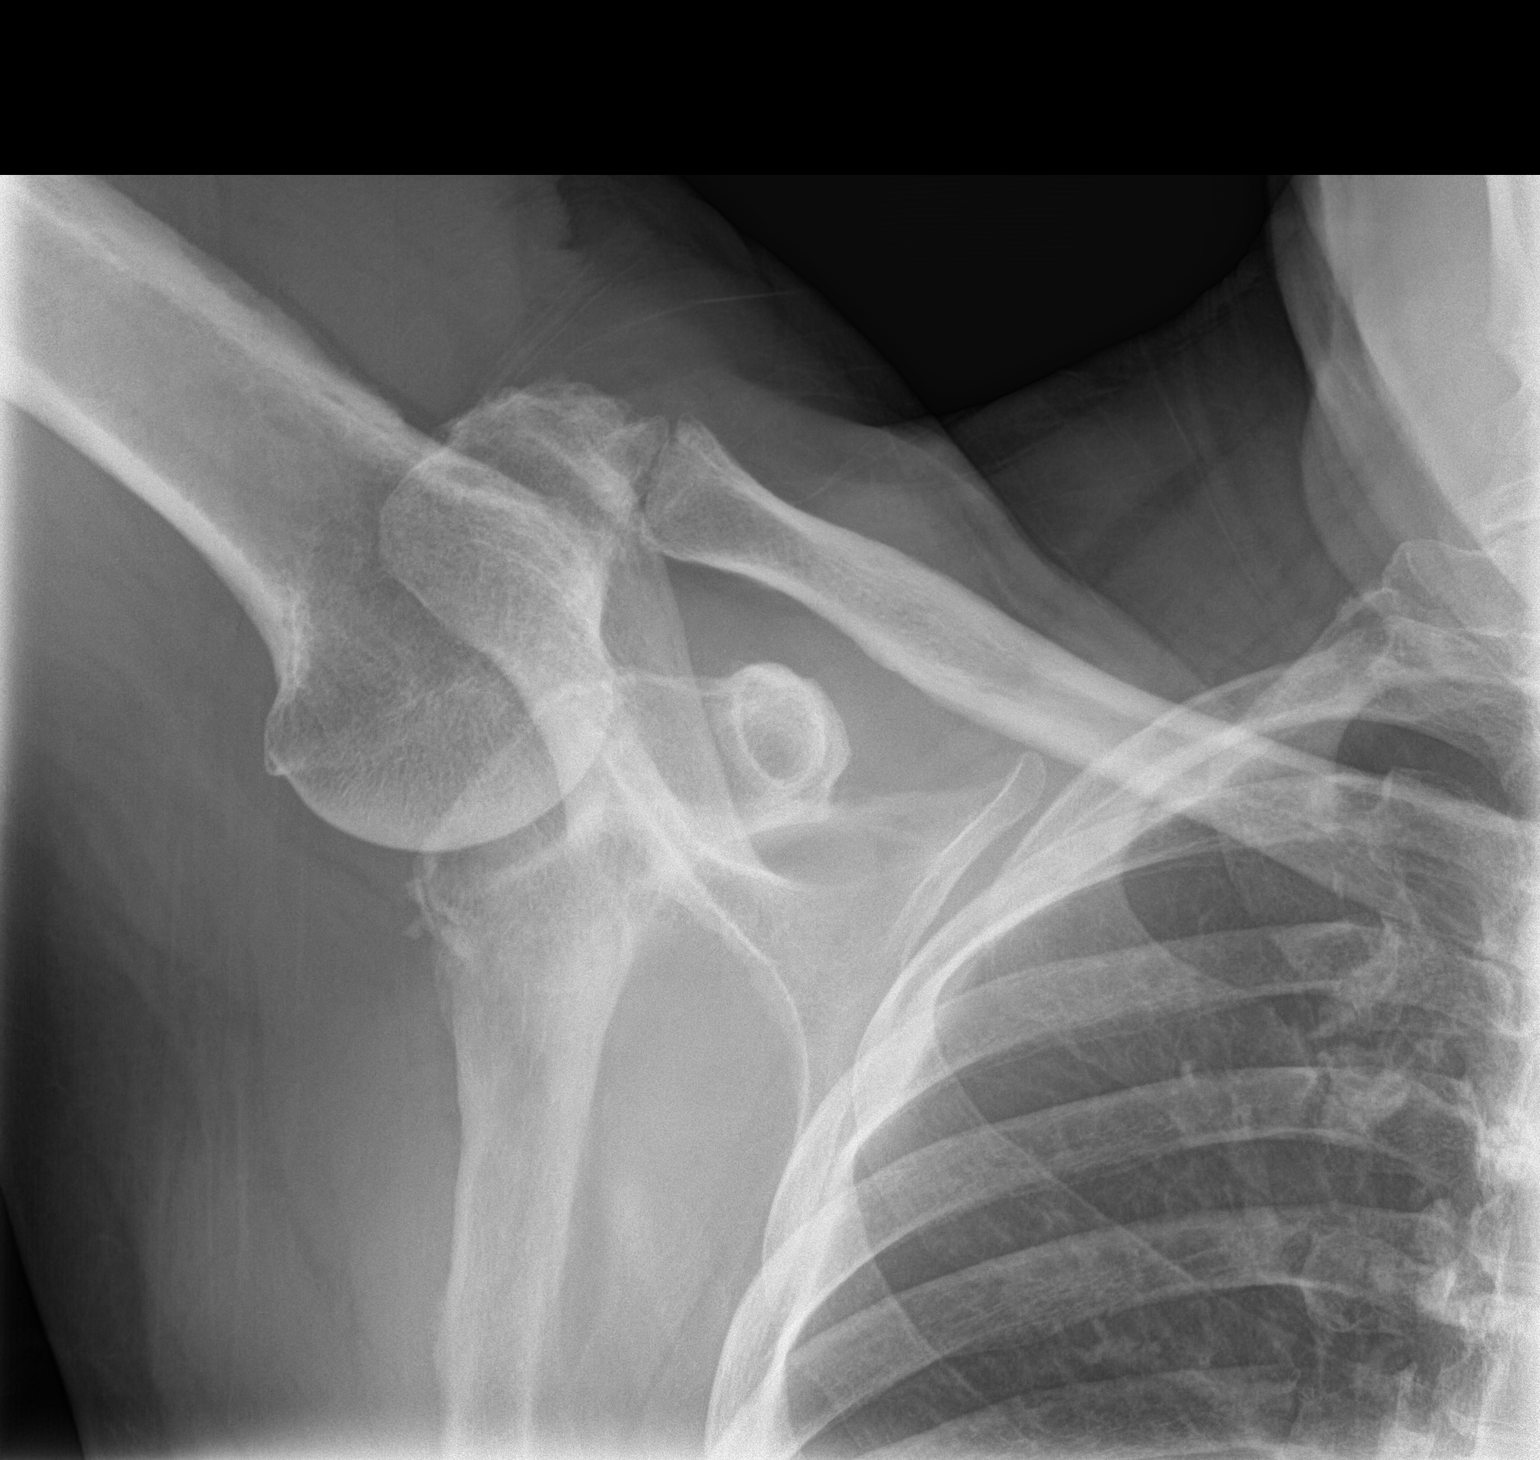

[shoulder obl]
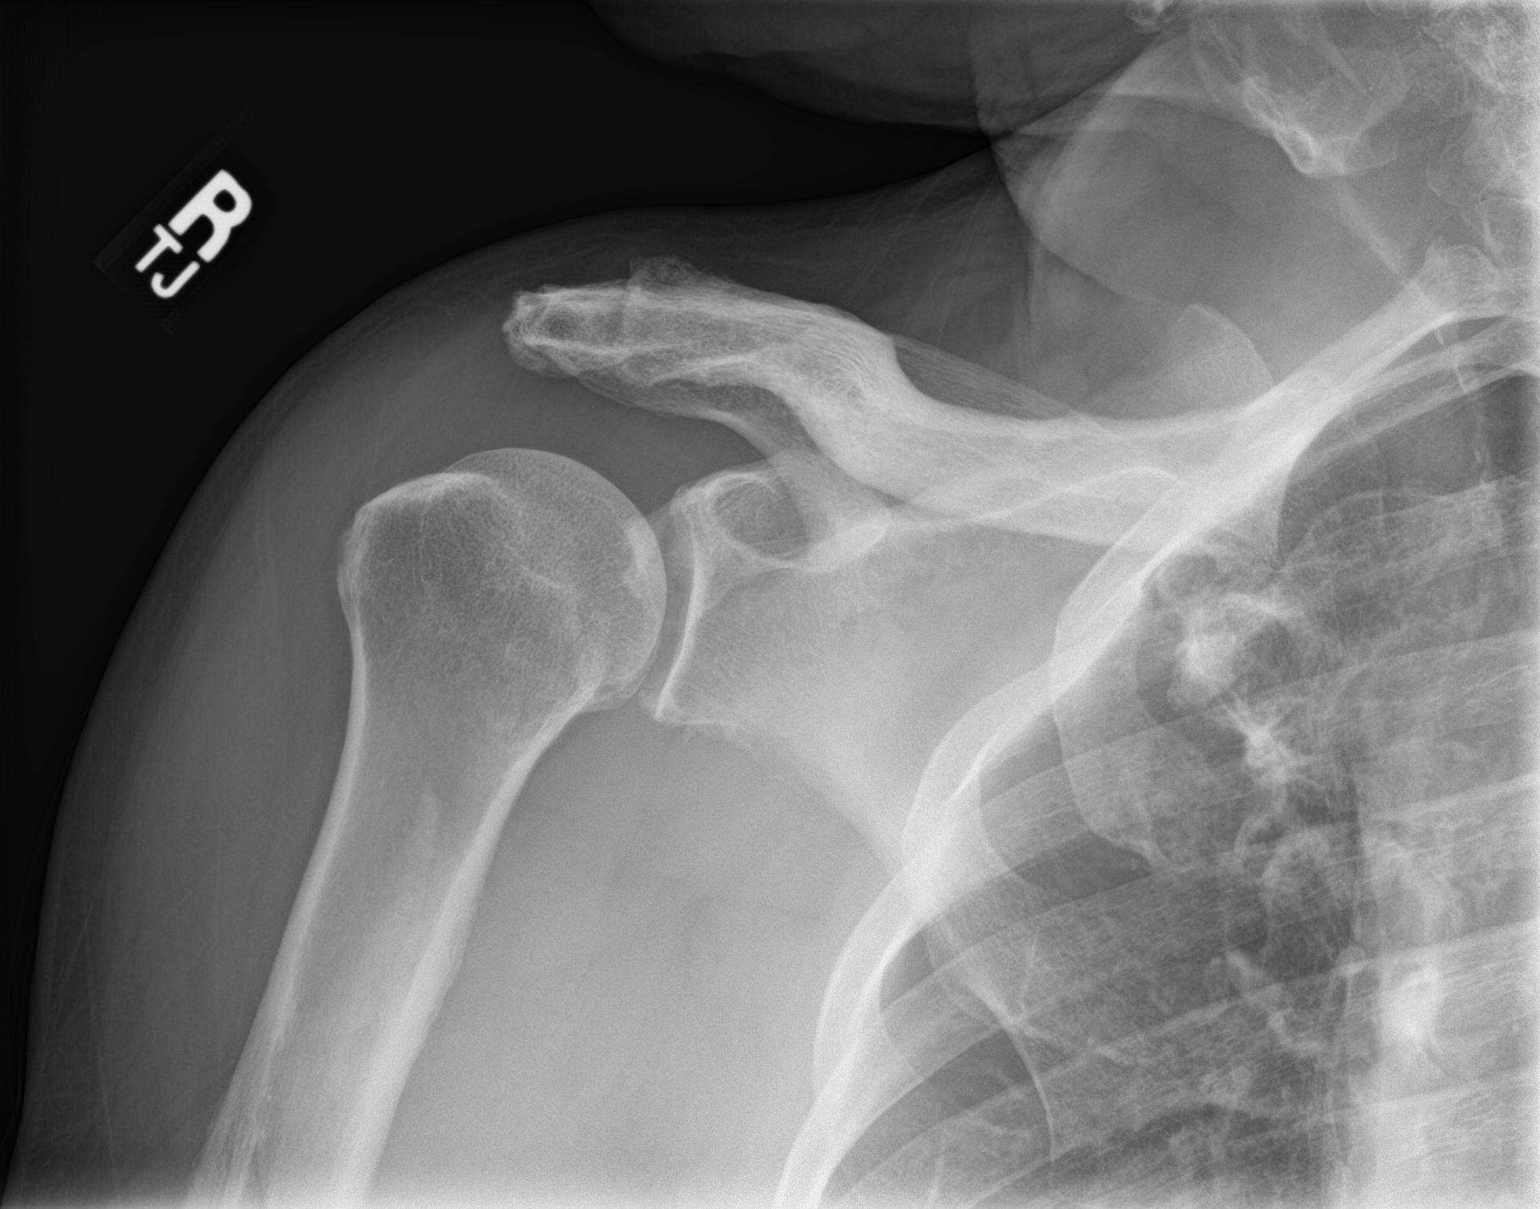

[shoulder y-view]
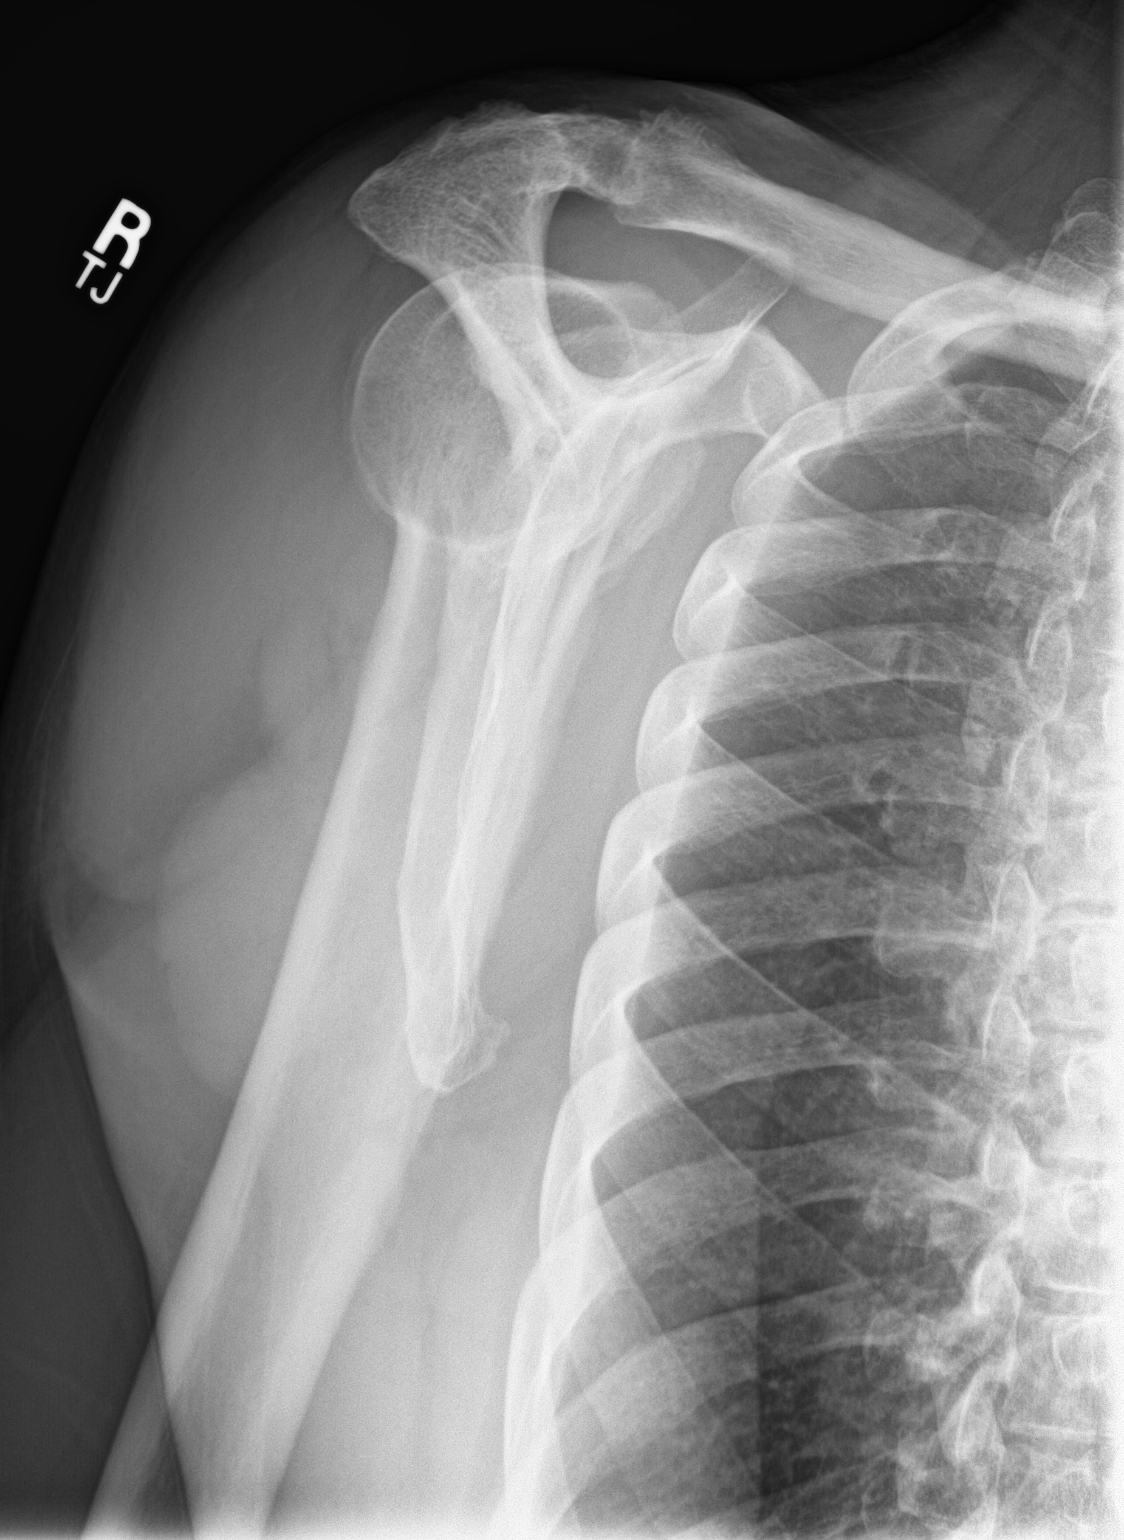

[3 of 3 positions shown; findings below may reference images not displayed]

FINDINGS: Advanced degenerative changes in the right AC joint. Glenohumeral
joint is maintained. No acute bony abnormality. Specifically, no
fracture, subluxation, or dislocation.
IMPRESSION: Advanced osteoarthritis right AC joint.  No acute bony abnormality.

## 2021-02-03 ENCOUNTER — Encounter: Payer: Self-pay | Admitting: Family Medicine

## 2021-02-03 ENCOUNTER — Ambulatory Visit (INDEPENDENT_AMBULATORY_CARE_PROVIDER_SITE_OTHER): Payer: Medicare Other | Admitting: Family Medicine

## 2021-02-03 ENCOUNTER — Other Ambulatory Visit: Payer: Self-pay

## 2021-02-03 VITALS — BP 134/70 | HR 64 | Temp 97.8°F | Ht 65.25 in | Wt 197.9 lb

## 2021-02-03 DIAGNOSIS — R011 Cardiac murmur, unspecified: Secondary | ICD-10-CM

## 2021-02-03 DIAGNOSIS — M25551 Pain in right hip: Secondary | ICD-10-CM

## 2021-02-03 DIAGNOSIS — N289 Disorder of kidney and ureter, unspecified: Secondary | ICD-10-CM

## 2021-02-03 DIAGNOSIS — H5702 Anisocoria: Secondary | ICD-10-CM | POA: Diagnosis not present

## 2021-02-03 DIAGNOSIS — I10 Essential (primary) hypertension: Secondary | ICD-10-CM

## 2021-02-03 DIAGNOSIS — M25552 Pain in left hip: Secondary | ICD-10-CM

## 2021-02-03 DIAGNOSIS — E1169 Type 2 diabetes mellitus with other specified complication: Secondary | ICD-10-CM | POA: Diagnosis not present

## 2021-02-03 LAB — CBC WITH DIFFERENTIAL/PLATELET
Basophils Absolute: 0 10*3/uL (ref 0.0–0.1)
Basophils Relative: 0.5 % (ref 0.0–3.0)
Eosinophils Absolute: 0.4 10*3/uL (ref 0.0–0.7)
Eosinophils Relative: 6.3 % — ABNORMAL HIGH (ref 0.0–5.0)
HCT: 42.6 % (ref 39.0–52.0)
Hemoglobin: 14.3 g/dL (ref 13.0–17.0)
Lymphocytes Relative: 26.4 % (ref 12.0–46.0)
Lymphs Abs: 1.7 10*3/uL (ref 0.7–4.0)
MCHC: 33.6 g/dL (ref 30.0–36.0)
MCV: 84.4 fl (ref 78.0–100.0)
Monocytes Absolute: 0.3 10*3/uL (ref 0.1–1.0)
Monocytes Relative: 5.3 % (ref 3.0–12.0)
Neutro Abs: 4 10*3/uL (ref 1.4–7.7)
Neutrophils Relative %: 61.5 % (ref 43.0–77.0)
Platelets: 312 10*3/uL (ref 150.0–400.0)
RBC: 5.05 Mil/uL (ref 4.22–5.81)
RDW: 13.8 % (ref 11.5–15.5)
WBC: 6.4 10*3/uL (ref 4.0–10.5)

## 2021-02-03 LAB — RENAL FUNCTION PANEL
Albumin: 4.4 g/dL (ref 3.5–5.2)
BUN: 15 mg/dL (ref 6–23)
CO2: 26 mEq/L (ref 19–32)
Calcium: 9.4 mg/dL (ref 8.4–10.5)
Chloride: 103 mEq/L (ref 96–112)
Creatinine, Ser: 1.23 mg/dL (ref 0.40–1.50)
GFR: 55.55 mL/min — ABNORMAL LOW (ref >60.00)
Glucose, Bld: 116 mg/dL — ABNORMAL HIGH (ref 70–99)
Phosphorus: 2.9 mg/dL (ref 2.3–4.6)
Potassium: 3.7 mEq/L (ref 3.5–5.1)
Sodium: 138 mEq/L (ref 135–145)

## 2021-02-03 LAB — SEDIMENTATION RATE: Sed Rate: 12 mm/hr (ref 0–20)

## 2021-02-03 LAB — HEMOGLOBIN A1C: Hgb A1c MFr Bld: 6.4 % (ref 4.6–6.5)

## 2021-02-03 LAB — VITAMIN D 25 HYDROXY (VIT D DEFICIENCY, FRACTURES): VITD: 21.49 ng/mL — ABNORMAL LOW (ref 30.00–100.00)

## 2021-02-03 NOTE — Patient Instructions (Addendum)
Labs today Schedule eye exam as you're due (Dr Dione Booze).  Good to see you today Return as needed or in 6 months for physical/wellness visit

## 2021-02-03 NOTE — Assessment & Plan Note (Signed)
Chronic, diet controlled. Update labs.  

## 2021-02-03 NOTE — Progress Notes (Signed)
Patient ID: Jeffrey Villarreal, male    DOB: 19-Sep-1940, 80 y.o.   MRN: [id number]  This visit was conducted in person.  BP 134/70   Pulse 64   Temp 97.8 F (36.6 C) (Temporal)   Ht 5' 5.25" (1.657 m)   Wt 197 lb 14.4 oz (89.8 kg)   SpO2 97%   BMI 32.68 kg/m    CC: 6 mo f/u visit  Subjective:   HPI: Jeffrey Villarreal is a 80 y.o. male presenting on 02/03/2021 for Follow-up (6 mo/)   Notes soreness to legs, hips and left arm into hand. Notes some locking of L hand. Feels very achey/sore. No stiffness or R shoulder pain, no neck pain. Treating with tylenol arthritis with benefit.   HTN - Compliant with current antihypertensive regimen of lisinopril 58m daily, amlodipine 140mdaily, atenolol 2562maily. Does not regularly check blood pressures at home. No low blood pressure readings or symptoms of dizziness/syncope. Denies HA, vision changes, CP/tightness, SOB, leg swelling.    DM - does not regularly check sugars. Compliant with antihyperglycemic regimen which includes: diet controlled. Denies low sugars or hypoglycemic symptoms. Denies paresthesias, blurry vision. Last diabetic eye exam 08/2019 - due. Glucometer brand: doesn't have this. Last foot exam: 03/2020 - today. DSME: completed 2014. Lab Results  Component Value Date   HGBA1C 6.4 02/03/2021   Diabetic Foot Exam - Simple   Simple Foot Form Diabetic Foot exam was performed with the following findings: Yes 02/03/2021 12:10 PM  Visual Inspection See comments: Yes Sensation Testing Intact to touch and monofilament testing bilaterally: Yes Pulse Check See comments: Yes Comments Diminished DP bilaterally Thickened nails bilaterally with developing onychogryphosis Maceration between 4th/5th toes    Lab Results  Component Value Date   MICROALBUR <0.7 09/02/2017        Relevant past medical, surgical, family and social history reviewed and updated as indicated. Interim medical history since our last visit  reviewed. Allergies and medications reviewed and updated. Outpatient Medications Prior to Visit  Medication Sig Dispense Refill   amLODipine (NORVASC) 10 MG tablet TAKE 1 TABLET(10 MG) BY MOUTH DAILY 90 tablet 1   aspirin EC 81 MG tablet Take 81 mg by mouth daily.     atenolol (TENORMIN) 25 MG tablet TAKE 1 TABLET(25 MG) BY MOUTH DAILY 90 tablet 1   lisinopril (ZESTRIL) 20 MG tablet TAKE 1 TABLET(20 MG) BY MOUTH DAILY 90 tablet 1   polyethylene glycol powder (GLYCOLAX/MIRALAX) powder Take 17 g by mouth daily as needed for moderate constipation. 850 g 1   pravastatin (PRAVACHOL) 80 MG tablet TAKE 1 TABLET BY MOUTH EVERY DAY. DISCONTINUE LIPITOR 90 tablet 1   tiZANidine (ZANAFLEX) 2 MG tablet TAKE 1 TABLET BY MOUTH TWICE DAILY AS NEEDED FOR MUSCLE SPASMS, MAY CAUSE DROWSINESS 30 tablet 0   Cyanocobalamin (B-12) 1000 MCG SUBL Place 1 tablet under the tongue daily.     omeprazole (PRILOSEC) 40 MG capsule Take 1 capsule (40 mg total) by mouth daily. (Patient not taking: Reported on 08/05/2020) 30 capsule 5   No facility-administered medications prior to visit.     Per HPI unless specifically indicated in ROS section below Review of Systems  Objective:  BP 134/70   Pulse 64   Temp 97.8 F (36.6 C) (Temporal)   Ht 5' 5.25" (1.657 m)   Wt 197 lb 14.4 oz (89.8 kg)   SpO2 97%   BMI 32.68 kg/m   Wt Readings from Last 3 Encounters:  02/03/21 197 lb 14.4 oz (89.8 kg)  08/05/20 199 lb 1 oz (90.3 kg)  04/01/20 201 lb 4 oz (91.3 kg)      Physical Exam Vitals and nursing note reviewed.  Constitutional:      Appearance: Normal appearance. He is not ill-appearing.  Eyes:     Extraocular Movements: Extraocular movements intact.     Conjunctiva/sclera: Conjunctivae normal.     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     Pulses: Normal pulses.     Heart sounds: Murmur (2/6 systolic USB) heard.  Pulmonary:     Effort:  Pulmonary effort is normal. No respiratory distress.     Breath sounds: Normal breath sounds. No wheezing, rhonchi or rales.  Musculoskeletal:     Right lower leg: No edema.     Left lower leg: No edema.     Comments: See HPI for foot exam if done  Skin:    General: Skin is warm and dry.     Findings: No rash.  Neurological:     Mental Status: He is alert.  Psychiatric:        Mood and Affect: Mood normal.        Behavior: Behavior normal.      Results for orders placed or performed in visit on 02/03/21  Renal function panel  Result Value Ref Range   Sodium 138 135 - 145 mEq/L   Potassium 3.7 3.5 - 5.1 mEq/L   Chloride 103 96 - 112 mEq/L   CO2 26 19 - 32 mEq/L   Albumin 4.4 3.5 - 5.2 g/dL   BUN 15 6 - 23 mg/dL   Creatinine, Ser 1.23 0.40 - 1.50 mg/dL   Glucose, Bld 116 (H) 70 - 99 mg/dL   Phosphorus 2.9 2.3 - 4.6 mg/dL   GFR 55.55 (L) >60.00 mL/min   Calcium 9.4 8.4 - 10.5 mg/dL  CBC with Differential/Platelet  Result Value Ref Range   WBC 6.4 4.0 - 10.5 K/uL   RBC 5.05 4.22 - 5.81 Mil/uL   Hemoglobin 14.3 13.0 - 17.0 g/dL   HCT 42.6 39.0 - 52.0 %   MCV 84.4 78.0 - 100.0 fl   MCHC 33.6 30.0 - 36.0 g/dL   RDW 13.8 11.5 - 15.5 %   Platelets 312.0 150.0 - 400.0 K/uL   Neutrophils Relative % 61.5 43.0 - 77.0 %   Lymphocytes Relative 26.4 12.0 - 46.0 %   Monocytes Relative 5.3 3.0 - 12.0 %   Eosinophils Relative 6.3 (H) 0.0 - 5.0 %   Basophils Relative 0.5 0.0 - 3.0 %   Neutro Abs 4.0 1.4 - 7.7 K/uL   Lymphs Abs 1.7 0.7 - 4.0 K/uL   Monocytes Absolute 0.3 0.1 - 1.0 K/uL   Eosinophils Absolute 0.4 0.0 - 0.7 K/uL   Basophils Absolute 0.0 0.0 - 0.1 K/uL  Hemoglobin A1c  Result Value Ref Range   Hgb A1c MFr Bld 6.4 4.6 - 6.5 %  VITAMIN D 25 Hydroxy (Vit-D Deficiency, Fractures)  Result Value Ref Range   VITD 21.49 (L) 30.00 - 100.00 ng/mL  Sedimentation rate  Result Value Ref Range   Sed Rate 12 0 - 20 mm/hr    Assessment & Plan:  This visit occurred during the  SARS-CoV-2 public health emergency.  Safety protocols were in place, including screening questions prior to the visit, additional usage of staff PPE, and extensive cleaning of exam room  while observing appropriate contact time as indicated for disinfecting solutions.   Problem List Items Addressed This Visit     Hypertension (Chronic)    Chronic, improved. Continue current reigmen.       Type 2 diabetes mellitus with other specified complication (HCC) - Primary    Chronic, diet controlled. Update labs.       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  If normal, anticipate OA related pain. Discussed tylenol use, consider supplement like turmeric or glucosamine      Relevant Orders   Sedimentation rate (Completed)   Systolic murmur    Mild. Echo from 2016 WNL. Will continue to monitor.         No orders of the defined types were placed in this encounter.  Orders Placed This Encounter  Procedures   Renal function panel   CBC with Differential/Platelet   Hemoglobin A1c   VITAMIN D 25 Hydroxy (Vit-D Deficiency, Fractures)   Sedimentation rate     Patient Instructions  Labs today Schedule eye exam as you're due (Dr Katy Fitch).  Good to see you today Return as needed or in 6 months for physical/wellness visit   Follow up plan: Return in about 6 months (around 08/06/2021) for annual exam, prior fasting for blood work, medicare wellness visit.  Ria Bush, MD

## 2021-02-03 NOTE — Assessment & Plan Note (Signed)
Chronic, improved. Continue current reigmen.

## 2021-02-03 NOTE — Assessment & Plan Note (Signed)
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

## 2021-02-03 NOTE — Assessment & Plan Note (Addendum)
Update labs today including Cr, vit D, CBC

## 2021-02-04 DIAGNOSIS — R011 Cardiac murmur, unspecified: Secondary | ICD-10-CM | POA: Insufficient documentation

## 2021-02-04 DIAGNOSIS — M25551 Pain in right hip: Secondary | ICD-10-CM | POA: Insufficient documentation

## 2021-02-04 DIAGNOSIS — I350 Nonrheumatic aortic (valve) stenosis: Secondary | ICD-10-CM | POA: Insufficient documentation

## 2021-02-04 MED ORDER — VITAMIN D3 25 MCG (1000 UT) PO CAPS: 1.0000 | ORAL_CAPSULE | Freq: Every day | ORAL | Status: DC

## 2021-02-04 NOTE — Assessment & Plan Note (Signed)
Bilateral hip and upper leg pain as well as left shoulder pain - check ESR r/o PMR.  If normal, anticipate OA related pain. Discussed tylenol use, consider supplement like turmeric or glucosamine

## 2021-02-04 NOTE — Assessment & Plan Note (Signed)
Mild. Echo from 2016 WNL. Will continue to monitor.

## 2021-02-06 ENCOUNTER — Telehealth: Payer: Self-pay | Admitting: Family Medicine

## 2021-02-06 NOTE — Telephone Encounter (Signed)
Jeffrey Villarreal called in he was told to call to get his results.

## 2021-02-06 NOTE — Telephone Encounter (Signed)
Addressed through result notes  

## 2021-02-13 ENCOUNTER — Other Ambulatory Visit: Payer: Self-pay | Admitting: Family Medicine

## 2021-02-17 ENCOUNTER — Telehealth: Payer: Self-pay | Admitting: Family Medicine

## 2021-02-17 MED ORDER — TURMERIC 500 MG PO CAPS: 1.0000 | ORAL_CAPSULE | Freq: Three times a day (TID) | ORAL | Status: DC

## 2021-02-17 NOTE — Telephone Encounter (Signed)
Jeffrey Villarreal called in wanted to know if Dr. Reece Agar can prescribe something else for Jeffrey Villarreal due to he is still having pain in his hip down to his leg. And the tylenol is not working.

## 2021-02-17 NOTE — Telephone Encounter (Signed)
Spoke with pt/pt's wife, Jeffrey Villarreal, relaying Dr. Timoteo Expose message. Verbalizes understanding.

## 2021-02-17 NOTE — Telephone Encounter (Signed)
Would suggest he try turmeric supplement may take 500mg  2-3 times daily.  Update with effect.

## 2021-03-20 DIAGNOSIS — M5416 Radiculopathy, lumbar region: Secondary | ICD-10-CM | POA: Diagnosis not present

## 2021-03-20 DIAGNOSIS — R030 Elevated blood-pressure reading, without diagnosis of hypertension: Secondary | ICD-10-CM | POA: Diagnosis not present

## 2021-03-20 DIAGNOSIS — I10 Essential (primary) hypertension: Secondary | ICD-10-CM | POA: Diagnosis not present

## 2021-04-03 DIAGNOSIS — I10 Essential (primary) hypertension: Secondary | ICD-10-CM | POA: Diagnosis not present

## 2021-04-03 DIAGNOSIS — M5416 Radiculopathy, lumbar region: Secondary | ICD-10-CM | POA: Diagnosis not present

## 2021-06-03 ENCOUNTER — Ambulatory Visit: Payer: Medicare Other | Admitting: Podiatry

## 2021-07-08 ENCOUNTER — Ambulatory Visit (INDEPENDENT_AMBULATORY_CARE_PROVIDER_SITE_OTHER): Payer: Medicare Other | Admitting: Family Medicine

## 2021-07-08 ENCOUNTER — Encounter: Payer: Self-pay | Admitting: Family Medicine

## 2021-07-08 ENCOUNTER — Other Ambulatory Visit: Payer: Self-pay

## 2021-07-08 VITALS — BP 120/56 | HR 75 | Temp 98.2°F | Ht 65.25 in | Wt 194.4 lb

## 2021-07-08 DIAGNOSIS — I10 Essential (primary) hypertension: Secondary | ICD-10-CM | POA: Diagnosis not present

## 2021-07-08 DIAGNOSIS — J22 Unspecified acute lower respiratory infection: Secondary | ICD-10-CM | POA: Insufficient documentation

## 2021-07-08 LAB — POC INFLUENZA A&B (BINAX/QUICKVUE)
Influenza A, POC: NEGATIVE
Influenza B, POC: NEGATIVE

## 2021-07-08 LAB — POC COVID19 BINAXNOW: SARS Coronavirus 2 Ag: NEGATIVE

## 2021-07-08 MED ORDER — BENZONATATE 100 MG PO CAPS: 100.0000 mg | ORAL_CAPSULE | Freq: Three times a day (TID) | ORAL | 0 refills | Status: DC | PRN

## 2021-07-08 NOTE — Progress Notes (Signed)
Patient ID: Jeffrey Villarreal, male    DOB: December 31, 1940, 81 y.o.   MRN: [id number]  This visit was conducted in person.  BP (!) 120/56 (BP Location: Right Arm, Cuff Size: Large)    Pulse 75    Temp 98.2 F (36.8 C) (Temporal)    Ht 5' 5.25" (1.657 m)    Wt 194 lb 7 oz (88.2 kg)    SpO2 96%    BMI 32.11 kg/m    CC: cough, congestion  Subjective:   HPI: Jeffrey Villarreal is a 81 y.o. male presenting on 07/08/2021 for Cough (C/o prod cough, chest congestion, runny nose and nasal congestion.  Started 07/06/21, worsening. )   2 d h/o productive cough, chest congestion, rhinorrhea and coryza. Worse overnight with coughing fits.   No fevers/chills, ear or tooth pain, ST, sinus pressure HA, abd pain, nausea, dyspnea or wheezing.   Hasn't tried anything for these symptoms yet besides tylenol.   No sick contacts at home.  No h/o asthma.  Ex smoker - quit 1987.   Regularly on lisinopril, atenolol, amlodipine. Normally takes BP mesd at 9am - hasn't taken this morning yet. Doesn't regularly check BP at home.   Has not had flu shot this year. COVID vaccine - Pfizer x3 including 1 booster.      Relevant past medical, surgical, family and social history reviewed and updated as indicated. Interim medical history since our last visit reviewed. Allergies and medications reviewed and updated. Outpatient Medications Prior to Visit  Medication Sig Dispense Refill   amLODipine (NORVASC) 10 MG tablet TAKE 1 TABLET(10 MG) BY MOUTH DAILY 90 tablet 1   aspirin EC 81 MG tablet Take 81 mg by mouth daily.     atenolol (TENORMIN) 25 MG tablet TAKE 1 TABLET(25 MG) BY MOUTH DAILY 90 tablet 1   Cholecalciferol (VITAMIN D3) 25 MCG (1000 UT) CAPS Take 1 capsule (1,000 Units total) by mouth daily. 30 capsule    lisinopril (ZESTRIL) 20 MG tablet TAKE 1 TABLET(20 MG) BY MOUTH DAILY 90 tablet 1   polyethylene glycol powder (GLYCOLAX/MIRALAX) powder Take 17 g by mouth daily as needed for moderate constipation. 850 g 1    pravastatin (PRAVACHOL) 80 MG tablet TAKE 1 TABLET BY MOUTH EVERY DAY. DISCONTINUE LIPITOR 90 tablet 1   tiZANidine (ZANAFLEX) 2 MG tablet TAKE 1 TABLET BY MOUTH TWICE DAILY AS NEEDED FOR MUSCLE SPASMS, MAY CAUSE DROWSINESS 30 tablet 0   Turmeric 500 MG CAPS Take 1 capsule by mouth in the morning, at noon, and at bedtime.     No facility-administered medications prior to visit.     Per HPI unless specifically indicated in ROS section below Review of Systems  Objective:  BP (!) 120/56 (BP Location: Right Arm, Cuff Size: Large)    Pulse 75    Temp 98.2 F (36.8 C) (Temporal)    Ht 5' 5.25" (1.657 m)    Wt 194 lb 7 oz (88.2 kg)    SpO2 96%    BMI 32.11 kg/m   Wt Readings from Last 3 Encounters:  07/08/21 194 lb 7 oz (88.2 kg)  02/03/21 197 lb 14.4 oz (89.8 kg)  08/05/20 199 lb 1 oz (90.3 kg)      Physical Exam Vitals and nursing note reviewed.  Constitutional:      Appearance: Normal appearance. He is not ill-appearing.  HENT:     Head: Normocephalic and atraumatic.     Ears:     Comments: Cerumen  bilateral ear canals with poor visualization of TMs  Eyes:     Extraocular Movements: Extraocular movements intact.     Pupils: Pupils are equal, round, and reactive to light.     Comments: Mild conjunctival injection bilateral eyes  Cardiovascular:     Rate and Rhythm: Normal rate and regular rhythm.     Pulses: Normal pulses.     Heart sounds: Murmur (2/6 systolic murmur) heard.  Pulmonary:     Effort: Pulmonary effort is normal. No respiratory distress.     Breath sounds: Normal breath sounds. No wheezing, rhonchi or rales.     Comments: Intermittent cough present although lungs largely clear Musculoskeletal:     Cervical back: Normal range of motion and neck supple.  Neurological:     Mental Status: He is alert.  Psychiatric:        Mood and Affect: Mood normal.        Behavior: Behavior normal.      Results for orders placed or performed in visit on 07/08/21  POC  Influenza A&B (Binax test)  Result Value Ref Range   Influenza A, POC Negative Negative   Influenza B, POC Negative Negative  POC COVID-19  Result Value Ref Range   SARS Coronavirus 2 Ag Negative Negative   Lab Results  Component Value Date   CREATININE 1.23 02/03/2021   BUN 15 02/03/2021   NA 138 02/03/2021   K 3.7 02/03/2021   CL 103 02/03/2021   CO2 26 02/03/2021  GFR = 55.5   Assessment & Plan:  This visit occurred during the SARS-CoV-2 public health emergency.  Safety protocols were in place, including screening questions prior to the visit, additional usage of staff PPE, and extensive cleaning of exam room while observing appropriate contact time as indicated for disinfecting solutions.   Problem List Items Addressed This Visit     Hypertension (Chronic)    BP elevated today in setting of acute illness. This does improve on recheck.  I have asked him to start monitoring blood pressures closely at home, let me know if persistently elevated >140/90 to titration bp medications accordingly.  Encouraged increased water, limiting salt/sodium in diet.       Acute respiratory infection - Primary    Check COVID and influenza POC tests today - negative.  Anticipate acute viral bronchitis. Supportive care reviewed.  Rx tessalon perls Discussed ok to use coricidin brand OTC cold meds and robitussin or delsym cough syrup.       Relevant Orders   POC Influenza A&B (Binax test) (Completed)   POC COVID-19 (Completed)     Meds ordered this encounter  Medications   benzonatate (TESSALON) 100 MG capsule    Sig: Take 1 capsule (100 mg total) by mouth 3 (three) times daily as needed for cough.    Dispense:  40 capsule    Refill:  0   Orders Placed This Encounter  Procedures   POC Influenza A&B (Binax test)   POC COVID-19    Order Specific Question:   Previously tested for COVID-19    Answer:   No    Order Specific Question:   Resident in a congregate (group) care setting     Answer:   No    Order Specific Question:   Employed in healthcare setting    Answer:   No     Patient Instructions  BP was initially high today but did improve on recheck - continue monitoring more closely at home, let  me know if persistently >140/90 when feeling better. Increase water intake, limit salt/sodium in the diet.  You likely have a viral bronchitis.  Viral infections usually take 7-10 days to resolve. The cough can last a few weeks to go away.  Use medication as prescribed: tessalon perls.  Push fluids and plenty of rest. Please return if you are not improving as expected, or if you have high fevers (>101.5) or difficulty swallowing or worsening productive cough. Call clinic with questions.  Good to see you today. I hope you start feeling better soon.   Follow up plan: Return if symptoms worsen or fail to improve.  Eustaquio BoydenJavier Jaecob Lowden, MD

## 2021-07-08 NOTE — Patient Instructions (Addendum)
BP was initially high today but did improve on recheck - continue monitoring more closely at home, let me know if persistently >140/90 when feeling better. Increase water intake, limit salt/sodium in the diet.  You likely have a viral bronchitis.  Viral infections usually take 7-10 days to resolve. The cough can last a few weeks to go away.  Use medication as prescribed: tessalon perls.  Push fluids and plenty of rest. Please return if you are not improving as expected, or if you have high fevers (>101.5) or difficulty swallowing or worsening productive cough. Call clinic with questions.  Good to see you today. I hope you start feeling better soon.

## 2021-07-08 NOTE — Assessment & Plan Note (Addendum)
BP elevated today in setting of acute illness. This does improve on recheck.  I have asked him to start monitoring blood pressures closely at home, let me know if persistently elevated >140/90 to titration bp medications accordingly.  Encouraged increased water, limiting salt/sodium in diet.

## 2021-07-08 NOTE — Assessment & Plan Note (Addendum)
Check COVID and influenza POC tests today - negative.  Anticipate acute viral bronchitis. Supportive care reviewed.  Rx tessalon perls Discussed ok to use coricidin brand OTC cold meds and robitussin or delsym cough syrup.

## 2021-07-23 ENCOUNTER — Other Ambulatory Visit: Payer: Self-pay | Admitting: Family Medicine

## 2021-07-23 DIAGNOSIS — N289 Disorder of kidney and ureter, unspecified: Secondary | ICD-10-CM

## 2021-07-23 DIAGNOSIS — E1169 Type 2 diabetes mellitus with other specified complication: Secondary | ICD-10-CM

## 2021-07-23 DIAGNOSIS — E538 Deficiency of other specified B group vitamins: Secondary | ICD-10-CM

## 2021-07-23 DIAGNOSIS — E559 Vitamin D deficiency, unspecified: Secondary | ICD-10-CM

## 2021-07-23 DIAGNOSIS — E785 Hyperlipidemia, unspecified: Secondary | ICD-10-CM

## 2021-07-29 NOTE — Progress Notes (Signed)
Subjective:   Jeffrey Villarreal is a 81 y.o. male who presents for Medicare Annual/Subsequent preventive examination.    Review of Systems     Cardiac Risk Factors include: advanced age (>83men, >53 women);diabetes mellitus;hypertension;dyslipidemia     Objective:    Today's Vitals   07/30/21 1322  BP: 120/80  Pulse: (!) 53  SpO2: 97%  Weight: 194 lb (88 kg)  Height: 5\' 5"  (1.651 m)   Body mass index is 32.28 kg/m.  Advanced Directives 07/29/2020 11/22/2018 03/28/2014  Does Patient Have a Medical Advance Directive? No Yes No  Type of Advance Directive - [place];Living will -  Copy of [place] in Chart? - No - copy requested -  Would patient like information on creating a medical advance directive? No - Patient declined - No - patient declined information    Current Medications (verified) Outpatient Encounter Medications as of 07/30/2021  Medication Sig   amLODipine (NORVASC) 10 MG tablet TAKE 1 TABLET(10 MG) BY MOUTH DAILY   aspirin EC 81 MG tablet Take 81 mg by mouth daily.   atenolol (TENORMIN) 25 MG tablet TAKE 1 TABLET(25 MG) BY MOUTH DAILY   benzonatate (TESSALON) 100 MG capsule Take 1 capsule (100 mg total) by mouth 3 (three) times daily as needed for cough.   lisinopril (ZESTRIL) 20 MG tablet TAKE 1 TABLET(20 MG) BY MOUTH DAILY   polyethylene glycol powder (GLYCOLAX/MIRALAX) powder Take 17 g by mouth daily as needed for moderate constipation.   pravastatin (PRAVACHOL) 80 MG tablet TAKE 1 TABLET BY MOUTH EVERY DAY. DISCONTINUE LIPITOR   tiZANidine (ZANAFLEX) 2 MG tablet TAKE 1 TABLET BY MOUTH TWICE DAILY AS NEEDED FOR MUSCLE SPASMS, MAY CAUSE DROWSINESS   Turmeric 500 MG CAPS Take 1 capsule by mouth in the morning, at noon, and at bedtime.   Cholecalciferol (VITAMIN D3) 25 MCG (1000 UT) CAPS Take 1 capsule (1,000 Units total) by mouth daily. (Patient not taking: Reported on 07/30/2021)   No facility-administered encounter  medications on file as of 07/30/2021.    Allergies (verified) Patient has no known allergies.   History: Past Medical History:  Diagnosis Date   Carotid stenosis 08/2014   1-39% bilateral, rpt [place] 2 yrs   Diet-controlled type 2 diabetes mellitus ([place])    DSME 02/2013   History of seizures as a child remote   Hyperlipidemia    Hypertension    Past Surgical History:  Procedure Laterality Date   CATARACT EXTRACTION Right 2013   COLONOSCOPY  08/2009   large int hemorrhoids, benign lymphoid polyp, rpt due 10 yrs ([place])   DOBUTAMINE STRESS ECHO  2016   WNL [place])   Family History  Problem Relation Age of Onset   Hypertension Mother    Hypertension Father    Cancer Sister        lung   Cancer Sister        lung   Coronary artery disease Neg Hx    Stroke Neg Hx    Diabetes Neg Hx    Kidney disease Neg Hx    Hyperlipidemia Neg Hx    Social History   Socioeconomic History   Marital status: Married    Spouse name: Not on file   Number of children: 7   Years of education: 1th grade   Highest education level: Not on file  Occupational History   Occupation: retired    Fish farm manager: Retired    Comment: used to work at [employer]  Tobacco Use   Smoking status: Former    Types: Cigarettes    Quit date: 06/14/1985    Years since quitting: 36.1   Smokeless tobacco: Never  Vaping Use   Vaping Use: Never used  Substance and Sexual Activity   Alcohol use: No    Comment: Quit 1987   Drug use: No   Sexual activity: Not Currently  Other Topics Concern   Not on file  Social History Narrative   Caffeine: 1 cup coffee.   Lives with wife, step-daughter (50yo), no pets   Occupation: retired   Activity: walks daily   Diet: good water, fruits/vegetables daily   Social Determinants of Radio broadcast assistant Strain: Low Risk    Difficulty of Paying Living Expenses: Not hard at all  Food Insecurity: No Food Insecurity   Worried About Charity fundraiser in the Last Year:  Never true   Arboriculturist in the Last Year: Never true  Transportation Needs: No Transportation Needs   Lack of Transportation (Medical): No   Lack of Transportation (Non-Medical): No  Physical Activity: Inactive   Days of Exercise per Week: 0 days   Minutes of Exercise per Session: 0 min  Stress: No Stress Concern Present   Feeling of Stress : Not at all  Social Connections: Socially Integrated   Frequency of Communication with Friends and Family: More than three times a week   Frequency of Social Gatherings with Friends and Family: More than three times a week   Attends Religious Services: More than 4 times per year   Active Member of [employer] or Organizations: Yes   Attends Music therapist: More than 4 times per year   Marital Status: Married    Tobacco Counseling Counseling given: Not Answered   Clinical Intake:  Pre-visit preparation completed: Yes  Pain : No/denies pain     BMI - recorded: 32.28 Nutritional Status: BMI > 30  Obese Nutritional Risks: None Diabetes: Yes CBG done?: No Did pt. bring in CBG monitor from home?: No  How often do you need to have someone help you when you read instructions, pamphlets, or other written materials from your doctor or pharmacy?: 1 - Never  Diabetes:  Is the patient diabetic?  Yes  If diabetic, was a CBG obtained today?  No  Did the patient bring in their glucometer from home?  No  How often do you monitor your CBG's? Not currently monitoring .   Financial Strains and Diabetes Management:  Are you having any financial strains with the device, your supplies or your medication? No .  Does the patient want to be seen by Chronic Care Management for management of their diabetes?  No  Would the patient like to be referred to a Nutritionist or for Diabetic Management?  No   Diabetic Exams:  Diabetic Eye Exam: Completed 2022, patient states going to see the eye doctor next week.   Diabetic Foot Exam: Completed  02/03/22.   Interpreter Needed?: No  Information entered by :: Orrin Brigham LPN   Activities of Daily Living In your present state of health, do you have any difficulty performing the following activities: 07/30/2021  Hearing? N  Vision? N  Difficulty concentrating or making decisions? N  Walking or climbing stairs? N  Dressing or bathing? N  Doing errands, shopping? N  Preparing Food and eating ? N  Using the Toilet? N  In the past six months, have you accidently leaked urine?  N  Do you have problems with loss of bowel control? N  Managing your Medications? N  Managing your Finances? N  Housekeeping or managing your Housekeeping? N  Some recent data might be hidden    Patient Care Team: Ria Bush, MD as PCP - General (Family Medicine) Debbora Dus, [facility] as Pharmacist (Pharmacist)  Indicate any recent Medical Services you may have received from other than [facility] providers in the past year (date may be approximate).     Assessment:   This is a routine wellness examination for [place].  Hearing/Vision screen Hearing Screening - Comments:: No issues  Vision Screening - Comments:: Last eye exam 2022, plans to see eye doctor next week , wears glasses   Dietary issues and exercise activities discussed: Current Exercise Habits: The patient does not participate in regular exercise at present   Goals Addressed             This Visit's Progress    Patient Stated       Would like to maintain current goal        Depression Screen PHQ 2/9 Scores 07/30/2021 07/29/2020 11/22/2018 09/14/2017 03/16/2016 02/25/2015 02/15/2014  PHQ - 2 Score 0 0 0 0 0 0 0  PHQ- 9 Score - 0 0 - - - -    Fall Risk Fall Risk  07/30/2021 07/29/2020 11/22/2018 09/14/2017 03/16/2016  Falls in the past year? 0 0 0 No Yes  Number falls in past yr: 0 0 - - 2 or more  Injury with Fall? 0 0 - - No  Risk for fall due to : No Fall Risks Medication side effect - - -  Follow up Falls prevention discussed  Falls evaluation completed;Falls prevention discussed - - -    FALL RISK PREVENTION PERTAINING TO THE HOME:  Any stairs in or around the home? Yes  If so, are there any without handrails? No  Home free of loose throw rugs in walkways, pet beds, electrical cords, etc? Yes  Adequate lighting in your home to reduce risk of falls? Yes   ASSISTIVE DEVICES UTILIZED TO PREVENT FALLS:  Life alert? No  Use of a cane, walker or w/c? No  Grab bars in the bathroom? Yes  Shower chair or bench in shower? No  Elevated toilet seat or a handicapped toilet? No   TIMED UP AND GO:  Was the test performed? No .  Length of time to ambulate 10 feet: 10 sec.   Gait steady and fast without use of assistive device  Cognitive Function: Normal cognitive status assessed by direct observation by this Nurse Health Advisor. No abnormalities found.   MMSE - Mini Mental State Exam 07/29/2020 11/22/2018  Not completed: Refused -  Orientation to time - 5  Orientation to Place - 5  Registration - 3  Attention/ Calculation - 0  Recall - 3  Language- name 2 objects - 0  Language- repeat - 1  Language- follow 3 step command - 0  Language- read & follow direction - 0  Write a sentence - 0  Copy design - 0  Total score - 17        Immunizations Immunization History  Administered Date(s) Administered   PFIZER(Purple Top)SARS-COV-2 Vaccination 09/15/2019, 10/10/2019, 05/21/2020   Pneumococcal Polysaccharide-23 08/05/2020   Td 06/15/2007    TDAP status: Due, Education has been provided regarding the importance of this vaccine. Advised may receive this vaccine at local pharmacy or Health Dept. Aware to provide a copy of the  vaccination record if obtained from local pharmacy or Health Dept. Verbalized acceptance and understanding.  Flu Vaccine status: Declined, Education has been provided regarding the importance of this vaccine but patient still declined. Advised may receive this vaccine at local pharmacy or  Health Dept. Aware to provide a copy of the vaccination record if obtained from local pharmacy or Health Dept. Verbalized acceptance and understanding.  Pneumococcal vaccine status: Up to date  Covid-19 vaccine status: Information provided on how to obtain vaccines.   Qualifies for Shingles Vaccine? Yes   Zostavax completed No   Shingrix Completed?: No.    Education has been provided regarding the importance of this vaccine. Patient has been advised to call insurance company to determine out of pocket expense if they have not yet received this vaccine. Advised may also receive vaccine at local pharmacy or Health Dept. Verbalized acceptance and understanding.  Screening Tests Health Maintenance  Topic Date Due   Zoster Vaccines- Shingrix (1 of 2) Never done   COVID-19 Vaccine (4 - Booster for Pfizer series) 07/16/2020   OPHTHALMOLOGY EXAM  08/12/2020   Pneumonia Vaccine 40+ Years old (2 - PCV) 08/05/2021   INFLUENZA VACCINE  09/11/2021 (Originally 01/12/2021)   TETANUS/TDAP  07/30/2023 (Originally 06/14/2017)   HEMOGLOBIN A1C  08/06/2021   FOOT EXAM  02/03/2022   HPV VACCINES  Aged Out    Health Maintenance  Health Maintenance Due  Topic Date Due   Zoster Vaccines- Shingrix (1 of 2) Never done   COVID-19 Vaccine (4 - Booster for Pfizer series) 07/16/2020   OPHTHALMOLOGY EXAM  08/12/2020   Pneumonia Vaccine 67+ Years old (2 - PCV) 08/05/2021    Colorectal cancer screening: No longer required.   Lung Cancer Screening: (Low Dose CT Chest recommended if Age 27-80 years, 30 pack-year currently smoking OR have quit w/in 15years.) does not qualify.    Additional Screening:  Hepatitis C Screening: does not qualify  Vision Screening: Recommended annual ophthalmology exams for early detection of glaucoma and other disorders of the eye. Is the patient up to date with their annual eye exam?  Yes  Who is the provider or what is the name of the office in which the patient attends annual eye  exams? Provider information unavailable   Dental Screening: Recommended annual dental exams for proper oral hygiene  Community Resource Referral / Chronic Care Management: CRR required this visit?  No   CCM required this visit?  No      Plan:     I have personally reviewed and noted the following in the patients chart:   Medical and social history Use of alcohol, tobacco or illicit drugs  Current medications and supplements including opioid prescriptions. Patient is not currently taking opioid prescriptions. Functional ability and status Nutritional status Physical activity Advanced directives List of other physicians Hospitalizations, surgeries, and ER visits in previous 12 months Vitals Screenings to include cognitive, depression, and falls Referrals and appointments  In addition, I have reviewed and discussed with patient certain preventive protocols, quality metrics, and best practice recommendations. A written personalized care plan for preventive services as well as general preventive health recommendations were provided to patient.     Loma Messing, LPN   [phone]   Nurse Health Advisor  Nurse Notes: none

## 2021-07-30 ENCOUNTER — Ambulatory Visit (INDEPENDENT_AMBULATORY_CARE_PROVIDER_SITE_OTHER): Payer: Medicare Other

## 2021-07-30 ENCOUNTER — Other Ambulatory Visit: Payer: Self-pay

## 2021-07-30 VITALS — BP 120/80 | HR 53 | Ht 65.0 in | Wt 194.0 lb

## 2021-07-30 DIAGNOSIS — Z0000 Encounter for general adult medical examination without abnormal findings: Secondary | ICD-10-CM

## 2021-07-30 NOTE — Patient Instructions (Addendum)
Jeffrey Villarreal , Thank you for taking time to come for your Medicare Wellness Visit. I appreciate your ongoing commitment to your health goals. Please review the following plan we discussed and let me know if I can assist you in the future.   Screening recommendations/referrals: Colonoscopy: no longer required  Recommended yearly ophthalmology/optometry visit for glaucoma screening and checkup Recommended yearly dental visit for hygiene and checkup  Vaccinations:,  Influenza vaccine: Due, declined today-May obtain vaccine at our office or your local pharmacy, Pneumococcal vaccine: up to date Tdap vaccine: due, last completed 06/14/17 Shingles vaccine: Discuss with pharmacy   Covid-19: newest booster available at your local pharmacy   Advanced directives: Please bring a copy of Living Will and/or Healthcare Power of Attorney for your chart.   Conditions/risks identified: see problem list   Next appointment: Follow up in one year for your annual wellness visit. 08/02/22 @ 2:45pm, this will be an in office visit  Preventive Care 81 Years and Older, Male Preventive care refers to lifestyle choices and visits with your health care provider that can promote health and wellness. What does preventive care include? A yearly physical exam. This is also called an annual well check. Dental exams once or twice a year. Routine eye exams. Ask your health care provider how often you should have your eyes checked. Personal lifestyle choices, including: Daily care of your teeth and gums. Regular physical activity. Eating a healthy diet. Avoiding tobacco and drug use. Limiting alcohol use. Practicing safe sex. Taking low doses of aspirin every day. Taking vitamin and mineral supplements as recommended by your health care provider. What happens during an annual well check? The services and screenings done by your health care provider during your annual well check will depend on your age, overall health,  lifestyle risk factors, and family history of disease. Counseling  Your health care provider may ask you questions about your: Alcohol use. Tobacco use. Drug use. Emotional well-being. Home and relationship well-being. Sexual activity. Eating habits. History of falls. Memory and ability to understand (cognition). Work and work Astronomer. Screening  You may have the following tests or measurements: Height, weight, and BMI. Blood pressure. Lipid and cholesterol levels. These may be checked every 5 years, or more frequently if you are over 26 years old. Skin check. Lung cancer screening. You may have this screening every year starting at age 81 if you have a 30-pack-year history of smoking and currently smoke or have quit within the past 15 years. Fecal occult blood test (FOBT) of the stool. You may have this test every year starting at age 81. Flexible sigmoidoscopy or colonoscopy. You may have a sigmoidoscopy every 5 years or a colonoscopy every 10 years starting at age 81. Prostate cancer screening. Recommendations will vary depending on your family history and other risks. Hepatitis C blood test. Hepatitis B blood test. Sexually transmitted disease (STD) testing. Diabetes screening. This is done by checking your blood sugar (glucose) after you have not eaten for a while (fasting). You may have this done every 1-3 years. Abdominal aortic aneurysm (AAA) screening. You may need this if you are a current or former smoker. Osteoporosis. You may be screened starting at age 19 if you are at high risk. Talk with your health care provider about your test results, treatment options, and if necessary, the need for more tests. Vaccines  Your health care provider may recommend certain vaccines, such as: Influenza vaccine. This is recommended every year. Tetanus, diphtheria, and acellular pertussis (  Tdap, Td) vaccine. You may need a Td booster every 10 years. Zoster vaccine. You may need this  after age 34. Pneumococcal 13-valent conjugate (PCV13) vaccine. One dose is recommended after age 37. Pneumococcal polysaccharide (PPSV23) vaccine. One dose is recommended after age 9. Talk to your health care provider about which screenings and vaccines you need and how often you need them. This information is not intended to replace advice given to you by your health care provider. Make sure you discuss any questions you have with your health care provider. Document Released: 06/27/2015 Document Revised: 02/18/2016 Document Reviewed: 04/01/2015 Elsevier Interactive Patient Education  2017 [employer] [employer] Prevention in the Home Falls can cause injuries. They can happen to people of all ages. There are many things you can do to make your home safe and to help prevent falls. What can I do on the outside of my home? Regularly fix the edges of walkways and driveways and fix any cracks. Remove anything that might make you trip as you walk through a door, such as a raised step or threshold. Trim any bushes or trees on the path to your home. Use bright outdoor lighting. Clear any walking paths of anything that might make someone trip, such as rocks or tools. Regularly check to see if handrails are loose or broken. Make sure that both sides of any steps have handrails. Any raised decks and porches should have guardrails on the edges. Have any leaves, snow, or ice cleared regularly. Use sand or salt on walking paths during winter. Clean up any spills in your garage right away. This includes oil or grease spills. What can I do in the bathroom? Use night lights. Install grab bars by the toilet and in the tub and shower. Do not use towel bars as grab bars. Use non-skid mats or decals in the tub or shower. If you need to sit down in the shower, use a plastic, non-slip stool. Keep the floor dry. Clean up any water that spills on the floor as soon as it happens. Remove soap buildup in the tub or  shower regularly. Attach bath mats securely with double-sided non-slip rug tape. Do not have throw rugs and other things on the floor that can make you trip. What can I do in the bedroom? Use night lights. Make sure that you have a light by your bed that is easy to reach. Do not use any sheets or blankets that are too big for your bed. They should not hang down onto the floor. Have a firm chair that has side arms. You can use this for support while you get dressed. Do not have throw rugs and other things on the floor that can make you trip. What can I do in the kitchen? Clean up any spills right away. Avoid walking on wet floors. Keep items that you use a lot in easy-to-reach places. If you need to reach something above you, use a strong step stool that has a grab bar. Keep electrical cords out of the way. Do not use floor polish or wax that makes floors slippery. If you must use wax, use non-skid floor wax. Do not have throw rugs and other things on the floor that can make you trip. What can I do with my stairs? Do not leave any items on the stairs. Make sure that there are handrails on both sides of the stairs and use them. Fix handrails that are broken or loose. Make sure that handrails are  as long as the stairways. Check any carpeting to make sure that it is firmly attached to the stairs. Fix any carpet that is loose or worn. Avoid having throw rugs at the top or bottom of the stairs. If you do have throw rugs, attach them to the floor with carpet tape. Make sure that you have a light switch at the top of the stairs and the bottom of the stairs. If you do not have them, ask someone to add them for you. What else can I do to help prevent falls? Wear shoes that: Do not have high heels. Have rubber bottoms. Are comfortable and fit you well. Are closed at the toe. Do not wear sandals. If you use a stepladder: Make sure that it is fully opened. Do not climb a closed stepladder. Make  sure that both sides of the stepladder are locked into place. Ask someone to hold it for you, if possible. Clearly mark and make sure that you can see: Any grab bars or handrails. First and last steps. Where the edge of each step is. Use tools that help you move around (mobility aids) if they are needed. These include: Canes. Walkers. Scooters. Crutches. Turn on the lights when you go into a dark area. Replace any light bulbs as soon as they burn out. Set up your furniture so you have a clear path. Avoid moving your furniture around. If any of your floors are uneven, fix them. If there are any pets around you, be aware of where they are. Review your medicines with your doctor. Some medicines can make you feel dizzy. This can increase your chance of falling. Ask your doctor what other things that you can do to help prevent falls. This information is not intended to replace advice given to you by your health care provider. Make sure you discuss any questions you have with your health care provider. Document Released: 03/27/2009 Document Revised: 11/06/2015 Document Reviewed: 07/05/2014 Elsevier Interactive Patient Education  2017 [employer].

## 2021-07-31 ENCOUNTER — Other Ambulatory Visit (INDEPENDENT_AMBULATORY_CARE_PROVIDER_SITE_OTHER): Payer: Medicare Other

## 2021-07-31 DIAGNOSIS — E559 Vitamin D deficiency, unspecified: Secondary | ICD-10-CM | POA: Diagnosis not present

## 2021-07-31 DIAGNOSIS — N289 Disorder of kidney and ureter, unspecified: Secondary | ICD-10-CM

## 2021-07-31 DIAGNOSIS — E538 Deficiency of other specified B group vitamins: Secondary | ICD-10-CM

## 2021-07-31 DIAGNOSIS — E1169 Type 2 diabetes mellitus with other specified complication: Secondary | ICD-10-CM | POA: Diagnosis not present

## 2021-07-31 DIAGNOSIS — E785 Hyperlipidemia, unspecified: Secondary | ICD-10-CM

## 2021-07-31 LAB — COMPREHENSIVE METABOLIC PANEL
ALT: 15 U/L (ref 0–53)
AST: 18 U/L (ref 0–37)
Albumin: 4.7 g/dL (ref 3.5–5.2)
Alkaline Phosphatase: 97 U/L (ref 39–117)
BUN: 11 mg/dL (ref 6–23)
CO2: 33 mEq/L — ABNORMAL HIGH (ref 19–32)
Calcium: 9.7 mg/dL (ref 8.4–10.5)
Chloride: 103 mEq/L (ref 96–112)
Creatinine, Ser: 1.19 mg/dL (ref 0.40–1.50)
GFR: 57.6 mL/min — ABNORMAL LOW (ref >60.00)
Glucose, Bld: 121 mg/dL — ABNORMAL HIGH (ref 70–99)
Potassium: 4.1 mEq/L (ref 3.5–5.1)
Sodium: 140 mEq/L (ref 135–145)
Total Bilirubin: 0.5 mg/dL (ref 0.2–1.2)
Total Protein: 7.4 g/dL (ref 6.0–8.3)

## 2021-07-31 LAB — CBC WITH DIFFERENTIAL/PLATELET
Basophils Absolute: 0.1 10*3/uL (ref 0.0–0.1)
Basophils Relative: 1.2 % (ref 0.0–3.0)
Eosinophils Absolute: 0.5 10*3/uL (ref 0.0–0.7)
Eosinophils Relative: 8.1 % — ABNORMAL HIGH (ref 0.0–5.0)
HCT: 43.6 % (ref 39.0–52.0)
Hemoglobin: 14.7 g/dL (ref 13.0–17.0)
Lymphocytes Relative: 29.7 % (ref 12.0–46.0)
Lymphs Abs: 2 10*3/uL (ref 0.7–4.0)
MCHC: 33.6 g/dL (ref 30.0–36.0)
MCV: 82.7 fl (ref 78.0–100.0)
Monocytes Absolute: 0.5 10*3/uL (ref 0.1–1.0)
Monocytes Relative: 7.8 % (ref 3.0–12.0)
Neutro Abs: 3.5 10*3/uL (ref 1.4–7.7)
Neutrophils Relative %: 53.2 % (ref 43.0–77.0)
Platelets: 344 10*3/uL (ref 150.0–400.0)
RBC: 5.28 Mil/uL (ref 4.22–5.81)
RDW: 13.7 % (ref 11.5–15.5)
WBC: 6.7 10*3/uL (ref 4.0–10.5)

## 2021-07-31 LAB — LIPID PANEL
Cholesterol: 158 mg/dL (ref 0–200)
HDL: 47.9 mg/dL (ref >39.00)
LDL Cholesterol: 95 mg/dL (ref 0–99)
NonHDL: 109.65
Total CHOL/HDL Ratio: 3
Triglycerides: 74 mg/dL (ref 0.0–149.0)
VLDL: 14.8 mg/dL (ref 0.0–40.0)

## 2021-07-31 LAB — MICROALBUMIN / CREATININE URINE RATIO
Creatinine,U: 361.7 mg/dL
Microalb Creat Ratio: 1.1 mg/g (ref 0.0–30.0)
Microalb, Ur: 3.9 mg/dL — ABNORMAL HIGH (ref 0.0–1.9)

## 2021-07-31 LAB — HEMOGLOBIN A1C: Hgb A1c MFr Bld: 6.4 % (ref 4.6–6.5)

## 2021-07-31 LAB — VITAMIN B12: Vitamin B-12: 292 pg/mL (ref 211–911)

## 2021-07-31 LAB — VITAMIN D 25 HYDROXY (VIT D DEFICIENCY, FRACTURES): VITD: 13.96 ng/mL — ABNORMAL LOW (ref 30.00–100.00)

## 2021-08-01 LAB — PARATHYROID HORMONE, INTACT (NO CA): PTH: 57 pg/mL (ref 16–77)

## 2021-08-07 ENCOUNTER — Other Ambulatory Visit: Payer: Self-pay

## 2021-08-07 ENCOUNTER — Ambulatory Visit (INDEPENDENT_AMBULATORY_CARE_PROVIDER_SITE_OTHER): Payer: Medicare Other | Admitting: Family Medicine

## 2021-08-07 ENCOUNTER — Encounter: Payer: Self-pay | Admitting: Family Medicine

## 2021-08-07 VITALS — BP 140/70 | HR 65 | Temp 97.9°F | Ht 64.5 in | Wt 190.1 lb

## 2021-08-07 DIAGNOSIS — I10 Essential (primary) hypertension: Secondary | ICD-10-CM | POA: Diagnosis not present

## 2021-08-07 DIAGNOSIS — N289 Disorder of kidney and ureter, unspecified: Secondary | ICD-10-CM | POA: Diagnosis not present

## 2021-08-07 DIAGNOSIS — E785 Hyperlipidemia, unspecified: Secondary | ICD-10-CM

## 2021-08-07 DIAGNOSIS — H5702 Anisocoria: Secondary | ICD-10-CM

## 2021-08-07 DIAGNOSIS — I6523 Occlusion and stenosis of bilateral carotid arteries: Secondary | ICD-10-CM | POA: Diagnosis not present

## 2021-08-07 DIAGNOSIS — R413 Other amnesia: Secondary | ICD-10-CM | POA: Diagnosis not present

## 2021-08-07 DIAGNOSIS — E1169 Type 2 diabetes mellitus with other specified complication: Secondary | ICD-10-CM | POA: Diagnosis not present

## 2021-08-07 DIAGNOSIS — E538 Deficiency of other specified B group vitamins: Secondary | ICD-10-CM

## 2021-08-07 DIAGNOSIS — E559 Vitamin D deficiency, unspecified: Secondary | ICD-10-CM | POA: Diagnosis not present

## 2021-08-07 DIAGNOSIS — E66811 Obesity, class 1: Secondary | ICD-10-CM

## 2021-08-07 DIAGNOSIS — K5900 Constipation, unspecified: Secondary | ICD-10-CM

## 2021-08-07 DIAGNOSIS — Z7189 Other specified counseling: Secondary | ICD-10-CM

## 2021-08-07 DIAGNOSIS — Z0000 Encounter for general adult medical examination without abnormal findings: Secondary | ICD-10-CM

## 2021-08-07 DIAGNOSIS — E669 Obesity, unspecified: Secondary | ICD-10-CM

## 2021-08-07 MED ORDER — TIZANIDINE HCL 2 MG PO TABS: ORAL_TABLET | ORAL | 0 refills | Status: DC

## 2021-08-07 MED ORDER — VITAMIN D3 1.25 MG (50000 UT) PO TABS: 1.0000 | ORAL_TABLET | ORAL | 1 refills | Status: DC

## 2021-08-07 NOTE — Assessment & Plan Note (Signed)
Chronic, stable on pravastatin 80mg  The ASCVD Risk score (Arnett DK, et al., 2019) failed to calculate for the following reasons:   The 2019 ASCVD risk score is only valid for ages 24 to 33

## 2021-08-07 NOTE — Assessment & Plan Note (Addendum)
Levels adequate off medication

## 2021-08-07 NOTE — Assessment & Plan Note (Signed)
Chronic, stable. Continue current regimen. 

## 2021-08-07 NOTE — Assessment & Plan Note (Signed)
Chronic, stable. Diet controlled. 

## 2021-08-07 NOTE — Assessment & Plan Note (Signed)
Not appreciated today. Consider updating carotid US next visit.

## 2021-08-07 NOTE — Assessment & Plan Note (Signed)
Advanced directives - has not completed. Would want wife to be HCPOA. Has packet at home. reviewed desires. Would be ok with CPR/compressions, shock, wouldn't want prolonged life support if terminal condition.  

## 2021-08-07 NOTE — Assessment & Plan Note (Signed)
Encouraged healthy diet and lifestyle choices to affect sustainable weight loss.  ?

## 2021-08-07 NOTE — Assessment & Plan Note (Signed)
Overall stable period.  

## 2021-08-07 NOTE — Assessment & Plan Note (Signed)
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 Stable period.

## 2021-08-07 NOTE — Assessment & Plan Note (Signed)
Preventative protocols reviewed and updated unless pt declined. Discussed healthy diet and lifestyle.  

## 2021-08-07 NOTE — Patient Instructions (Addendum)
If interested, check with pharmacy about new 2 shot shingles series (shingrix).  Have Dr Dione Booze send Jeffrey Villarreal report for next week's eye exam.  Work on living will, bring Jeffrey Villarreal a copy when completed.  You are doing well today. Return as needed or in 6 months for diabetes check.  Take prescription vitamin D 50,000 units weekly for 6 months. Once complete, restart vitamin D 1000 units daily over the counter.   Health Maintenance After Age 81 After age 72, you are at a higher risk for certain long-term diseases and infections as well as injuries from falls. Falls are a major cause of broken bones and head injuries in people who are older than age 86. Getting regular preventive care can help to keep you healthy and well. Preventive care includes getting regular testing and making lifestyle changes as recommended by your health care provider. Talk with your health care provider about: Which screenings and tests you should have. A screening is a test that checks for a disease when you have no symptoms. A diet and exercise plan that is right for you. What should I know about screenings and tests to prevent falls? Screening and testing are the best ways to find a health problem early. Early diagnosis and treatment give you the best chance of managing medical conditions that are common after age 29. Certain conditions and lifestyle choices may make you more likely to have a fall. Your health care provider may recommend: Regular vision checks. Poor vision and conditions such as cataracts can make you more likely to have a fall. If you wear glasses, make sure to get your prescription updated if your vision changes. Medicine review. Work with your health care provider to regularly review all of the medicines you are taking, including over-the-counter medicines. Ask your health care provider about any side effects that may make you more likely to have a fall. Tell your health care provider if any medicines that you take make  you feel dizzy or sleepy. Strength and balance checks. Your health care provider may recommend certain tests to check your strength and balance while standing, walking, or changing positions. Foot health exam. Foot pain and numbness, as well as not wearing proper footwear, can make you more likely to have a fall. Screenings, including: Osteoporosis screening. Osteoporosis is a condition that causes the bones to get weaker and break more easily. Blood pressure screening. Blood pressure changes and medicines to control blood pressure can make you feel dizzy. Depression screening. You may be more likely to have a fall if you have a fear of falling, feel depressed, or feel unable to do activities that you used to do. Alcohol use screening. Using too much alcohol can affect your balance and may make you more likely to have a fall. Follow these instructions at home: Lifestyle Do not drink alcohol if: Your health care provider tells you not to drink. If you drink alcohol: Limit how much you have to: 0-1 drink a day for women. 0-2 drinks a day for men. Know how much alcohol is in your drink. In the [place] one drink equals one 12 oz bottle of beer (355 mL), one 5 oz glass of wine (148 mL), or one 1 oz glass of hard liquor (44 mL). Do not use any products that contain nicotine or tobacco. These products include cigarettes, chewing tobacco, and vaping devices, such as e-cigarettes. If you need help quitting, ask your health care provider. Activity  Follow a regular exercise program to  stay fit. This will help you maintain your balance. Ask your health care provider what types of exercise are appropriate for you. If you need a cane or walker, use it as recommended by your health care provider. Wear supportive shoes that have nonskid soles. Safety  Remove any tripping hazards, such as rugs, cords, and clutter. Install safety equipment such as grab bars in bathrooms and safety rails on stairs. Keep  rooms and walkways well-lit. General instructions Talk with your health care provider about your risks for falling. Tell your health care provider if: You fall. Be sure to tell your health care provider about all falls, even ones that seem minor. You feel dizzy, tiredness (fatigue), or off-balance. Take over-the-counter and prescription medicines only as told by your health care provider. These include supplements. Eat a healthy diet and maintain a healthy weight. A healthy diet includes low-fat dairy products, low-fat (lean) meats, and fiber from whole grains, beans, and lots of fruits and vegetables. Stay current with your vaccines. Schedule regular health, dental, and eye exams. Summary Having a healthy lifestyle and getting preventive care can help to protect your health and wellness after age 56. Screening and testing are the best way to find a health problem early and help you avoid having a fall. Early diagnosis and treatment give you the best chance for managing medical conditions that are more common for people who are older than age 31. Falls are a major cause of broken bones and head injuries in people who are older than age 46. Take precautions to prevent a fall at home. Work with your health care provider to learn what changes you can make to improve your health and wellness and to prevent falls. This information is not intended to replace advice given to you by your health care provider. Make sure you discuss any questions you have with your health care provider. Document Revised: 10/20/2020 Document Reviewed: 10/20/2020 Elsevier Patient Education  [place].

## 2021-08-07 NOTE — Assessment & Plan Note (Signed)
Chronic issue.  

## 2021-08-07 NOTE — Assessment & Plan Note (Signed)
Has been out of vit D replacement for a few months. Will start 50k U weekly replacement x6 months then restart 1000 IU daily.

## 2021-08-07 NOTE — Assessment & Plan Note (Addendum)
Discussed miralax dosing. 

## 2021-08-07 NOTE — Progress Notes (Signed)
Patient ID: Jeffrey Villarreal, male    DOB: 28-Nov-1940, 81 y.o.   MRN: [id number]  This visit was conducted in person.  BP 140/70    Pulse 65    Temp 97.9 F (36.6 C) (Temporal)    Ht 5' 4.5" (1.638 m)    Wt 190 lb 2 oz (86.2 kg)    SpO2 97%    BMI 32.13 kg/m    CC: CPE Subjective:   HPI: Jeffrey Villarreal is a 81 y.o. male presenting on 08/07/2021 for Annual Exam (MCR prt 2. )   Saw health advisor last week for medicare wellness visit. Note reviewed.   No results found.  Flowsheet Row Clinical Support from 07/30/2021 in [facility] [place]  PHQ-2 Total Score 0       Fall Risk  07/30/2021 07/29/2020 11/22/2018 09/14/2017 03/16/2016  Falls in the past year? 0 0 0 No Yes  Number falls in past yr: 0 0 - - 2 or more  Injury with Fall? 0 0 - - No  Risk for fall due to : No Fall Risks Medication side effect - - -  Follow up Falls prevention discussed Falls evaluation completed;Falls prevention discussed - - -   Notes occasional muscle sapas to back or R anterior thigh. Notes zanaflex helps with this. Requests refill.    Preventative: Colonoscopy 08/2009, large int hemorrhoids, benign lymphoid polyp, rpt due 10 yrs Elnoria Howard). Denies BM changes or blood in stool. Did not return iFOB.  Prostate - aged out. No previous measures. Nocturia x2  Lung cancer screening - not eligible  Never taken flu shot - declines  COVID vaccine Pfizer 09/2019 x2, 05/2020 booster Tetanus - unsure, thinks ~2009  Pneumovax-23 07/2020 Shingrix - discussed, to check with pharmacy  Advanced directives - has not completed. Would want wife to be HCPOA. Has packet at home. reviewed desires. Would be ok with CPR/compressions, shock, wouldn't want prolonged life support if terminal condition.  Seat belt use discussed  Sunscreen use discussed. No changing moles on skin  Ex smoker - quit 1987  Alcohol - none - quit 1987  Dentist - does not see - has full dentures  Eye exam - sees yearly - upcoming appt next week   Bowel - occasional constipation managed with prn miralax  Bladder - no significant urge incontinence at this time   Caffeine: 1 cup coffee.   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 Interim medical history since our last visit reviewed. Allergies and medications reviewed and updated. Outpatient Medications Prior to Visit  Medication Sig Dispense Refill   amLODipine (NORVASC) 10 MG tablet TAKE 1 TABLET(10 MG) BY MOUTH DAILY 90 tablet 1   aspirin EC 81 MG tablet Take 81 mg by mouth daily.     atenolol (TENORMIN) 25 MG tablet TAKE 1 TABLET(25 MG) BY MOUTH DAILY 90 tablet 1   lisinopril (ZESTRIL) 20 MG tablet TAKE 1 TABLET(20 MG) BY MOUTH DAILY 90 tablet 1   polyethylene glycol powder (GLYCOLAX/MIRALAX) powder Take 17 g by mouth daily as needed for moderate constipation. 850 g 1   pravastatin (PRAVACHOL) 80 MG tablet TAKE 1 TABLET BY MOUTH EVERY DAY. DISCONTINUE LIPITOR 90 tablet 1  Turmeric 500 MG CAPS Take 1 capsule by mouth in the morning, at noon, and at bedtime.     tiZANidine (ZANAFLEX) 2 MG tablet TAKE 1 TABLET BY MOUTH TWICE DAILY AS NEEDED FOR MUSCLE SPASMS, MAY CAUSE DROWSINESS 30 tablet 0   benzonatate (TESSALON) 100 MG capsule Take 1 capsule (100 mg total) by mouth 3 (three) times daily as needed for cough. 40 capsule 0   Cholecalciferol (VITAMIN D3) 25 MCG (1000 UT) CAPS Take 1 capsule (1,000 Units total) by mouth daily. (Patient not taking: Reported on 08/07/2021) 30 capsule    No facility-administered medications prior to visit.     Per HPI unless specifically indicated in ROS section below Review of Systems  Constitutional:  Negative for activity change, appetite change,  chills, fatigue, fever and unexpected weight change.  HENT:  Negative for hearing loss.   Eyes:  Negative for visual disturbance.  Respiratory:  Negative for cough, chest tightness, shortness of breath and wheezing.   Cardiovascular:  Negative for chest pain, palpitations and leg swelling.  Gastrointestinal:  Negative for abdominal distention, abdominal pain, blood in stool, constipation, diarrhea, nausea and vomiting.  Genitourinary:  Negative for difficulty urinating and hematuria.  Musculoskeletal:  Negative for arthralgias, myalgias and neck pain.  Skin:  Negative for rash.  Neurological:  Negative for dizziness, seizures, syncope and headaches.  Hematological:  Negative for adenopathy. Does not bruise/bleed easily.  Psychiatric/Behavioral:  Negative for dysphoric mood. The patient is not nervous/anxious.    Objective:  BP 140/70    Pulse 65    Temp 97.9 F (36.6 C) (Temporal)    Ht 5' 4.5" (1.638 m)    Wt 190 lb 2 oz (86.2 kg)    SpO2 97%    BMI 32.13 kg/m   Wt Readings from Last 3 Encounters:  08/07/21 190 lb 2 oz (86.2 kg)  07/30/21 194 lb (88 kg)  07/08/21 194 lb 7 oz (88.2 kg)      Physical Exam Vitals and nursing note reviewed.  Constitutional:      General: He is not in acute distress.    Appearance: Normal appearance. He is well-developed. He is not ill-appearing.  HENT:     Head: Normocephalic and atraumatic.     Right Ear: Hearing, tympanic membrane, ear canal and external ear normal.     Left Ear: Hearing, tympanic membrane, ear canal and external ear normal.  Eyes:     General: No scleral icterus.    Extraocular Movements: Extraocular movements intact.     Conjunctiva/sclera: Conjunctivae normal.     Pupils: Pupils are equal, round, and reactive to light.  Neck:     Thyroid: No thyroid mass or thyromegaly.     Vascular: No carotid bruit.  Cardiovascular:     Rate and Rhythm: Normal rate and regular rhythm.     Pulses: Normal pulses.          Radial pulses  are 2+ on the right side and 2+ on the left side.     Heart sounds: Normal heart sounds. No murmur heard. Pulmonary:     Effort: Pulmonary effort is normal. No respiratory distress.     Breath sounds: Normal breath sounds. No wheezing, rhonchi or rales.  Abdominal:     General: Bowel sounds are normal. There is no distension.     Palpations: Abdomen is soft. There is no mass.     Tenderness: There is no abdominal tenderness. There is no guarding or rebound.  Hernia: No hernia is present.  Musculoskeletal:        General: Normal range of motion.     Cervical back: Normal range of motion and neck supple.     Right lower leg: No edema.     Left lower leg: No edema.  Lymphadenopathy:     Cervical: No cervical adenopathy.  Skin:    General: Skin is warm and dry.     Findings: No rash.  Neurological:     General: No focal deficit present.     Mental Status: He is alert and oriented to person, place, and time.  Psychiatric:        Mood and Affect: Mood normal.        Behavior: Behavior normal.        Thought Content: Thought content normal.        Judgment: Judgment normal.      Results for orders placed or performed in visit on 07/31/21  VITAMIN D 25 Hydroxy (Vit-D Deficiency, Fractures)  Result Value Ref Range   VITD 13.96 (L) 30.00 - 100.00 ng/mL  CBC with Differential/Platelet  Result Value Ref Range   WBC 6.7 4.0 - 10.5 K/uL   RBC 5.28 4.22 - 5.81 Mil/uL   Hemoglobin 14.7 13.0 - 17.0 g/dL   HCT 43.6 39.0 - 52.0 %   MCV 82.7 78.0 - 100.0 fl   MCHC 33.6 30.0 - 36.0 g/dL   RDW 13.7 11.5 - 15.5 %   Platelets 344.0 150.0 - 400.0 K/uL   Neutrophils Relative % 53.2 43.0 - 77.0 %   Lymphocytes Relative 29.7 12.0 - 46.0 %   Monocytes Relative 7.8 3.0 - 12.0 %   Eosinophils Relative 8.1 (H) 0.0 - 5.0 %   Basophils Relative 1.2 0.0 - 3.0 %   Neutro Abs 3.5 1.4 - 7.7 K/uL   Lymphs Abs 2.0 0.7 - 4.0 K/uL   Monocytes Absolute 0.5 0.1 - 1.0 K/uL   Eosinophils Absolute 0.5 0.0 -  0.7 K/uL   Basophils Absolute 0.1 0.0 - 0.1 K/uL  Microalbumin / creatinine urine ratio  Result Value Ref Range   Microalb, Ur 3.9 (H) 0.0 - 1.9 mg/dL   Creatinine,U 361.7 mg/dL   Microalb Creat Ratio 1.1 0.0 - 30.0 mg/g  Hemoglobin A1c  Result Value Ref Range   Hgb A1c MFr Bld 6.4 4.6 - 6.5 %  Comprehensive metabolic panel  Result Value Ref Range   Sodium 140 135 - 145 mEq/L   Potassium 4.1 3.5 - 5.1 mEq/L   Chloride 103 96 - 112 mEq/L   CO2 33 (H) 19 - 32 mEq/L   Glucose, Bld 121 (H) 70 - 99 mg/dL   BUN 11 6 - 23 mg/dL   Creatinine, Ser 1.19 0.40 - 1.50 mg/dL   Total Bilirubin 0.5 0.2 - 1.2 mg/dL   Alkaline Phosphatase 97 39 - 117 U/L   AST 18 0 - 37 U/L   ALT 15 0 - 53 U/L   Total Protein 7.4 6.0 - 8.3 g/dL   Albumin 4.7 3.5 - 5.2 g/dL   GFR 57.60 (L) >60.00 mL/min   Calcium 9.7 8.4 - 10.5 mg/dL  Lipid panel  Result Value Ref Range   Cholesterol 158 0 - 200 mg/dL   Triglycerides 74.0 0.0 - 149.0 mg/dL   HDL 47.90 >39.00 mg/dL   VLDL 14.8 0.0 - 40.0 mg/dL   LDL Cholesterol 95 0 - 99 mg/dL   Total CHOL/HDL Ratio 3  NonHDL 109.65   Vitamin B12  Result Value Ref Range   Vitamin B-12 292 211 - 911 pg/mL  Parathyroid hormone, intact (no Ca)  Result Value Ref Range   PTH 57 16 - 77 pg/mL    Assessment & Plan:  This visit occurred during the SARS-CoV-2 public health emergency.  Safety protocols were in place, including screening questions prior to the visit, additional usage of staff PPE, and extensive cleaning of exam room while observing appropriate contact time as indicated for disinfecting solutions.   Problem List Items Addressed This Visit     Health maintenance examination - Primary (Chronic)    Preventative protocols reviewed and updated unless pt declined. Discussed healthy diet and lifestyle.       Advanced care planning/counseling discussion (Chronic)    Advanced directives - has not completed. Would want wife to be HCPOA. Has packet at home. reviewed  desires. Would be ok with CPR/compressions, shock, wouldn't want prolonged life support if terminal condition.       Hypertension    Chronic, stable. Continue current regimen.       Type 2 diabetes mellitus with other specified complication (HCC)    Chronic, stable. Diet controlled.       Hyperlipidemia associated with type 2 diabetes mellitus (HCC)    Chronic, stable on pravastatin 80mg  The ASCVD Risk score (Arnett DK, et al., 2019) failed to calculate for the following reasons:   The 2019 ASCVD risk score is only valid for ages 41 to 9       Constipation    Discussed miralax dosing.       Obesity, Class I, BMI 30-34.9    Encouraged healthy diet and lifestyle choices to affect sustainable weight loss.       Carotid stenosis    Not appreciated today. Consider updating carotid US next visit.       Memory deficit    Overall stable period.       Anisocoria    Chronic issue      Vitamin B12 deficiency    Levels adequate off medication      Renal insufficiency    Anticipate CKD from HTN and DM hx. Stable period.       Vitamin D deficiency    Has been out of vit D replacement for a few months. Will start 50k U weekly replacement x6 months then restart 1000 IU daily.         Meds ordered this encounter  Medications   tiZANidine (ZANAFLEX) 2 MG tablet    Sig: TAKE 1 TABLET BY MOUTH TWICE DAILY AS NEEDED FOR MUSCLE SPASMS, MAY CAUSE DROWSINESS    Dispense:  30 tablet    Refill:  0   Cholecalciferol (VITAMIN D3) 1.25 MG (50000 UT) TABS    Sig: Take 1 tablet by mouth once a week.    Dispense:  12 tablet    Refill:  1   No orders of the defined types were placed in this encounter.    Patient instructions: If interested, check with pharmacy about new 2 shot shingles series (shingrix).  Have Dr Katy Fitch send [place] report for next week's eye exam.  Work on living will, bring [place] a copy when completed.  You are doing well today. Return as needed or in 6 months for  diabetes check.  Take prescription vitamin D 50,000 units weekly for 6 months. Once complete, restart vitamin D 1000 units daily over the counter.   Follow up plan: Return  in about 6 months (around 02/04/2022) for follow up visit.  Ria Bush, MD

## 2021-08-10 ENCOUNTER — Other Ambulatory Visit (INDEPENDENT_AMBULATORY_CARE_PROVIDER_SITE_OTHER): Payer: Medicare Other

## 2021-08-10 DIAGNOSIS — Z1211 Encounter for screening for malignant neoplasm of colon: Secondary | ICD-10-CM

## 2021-08-10 LAB — FECAL OCCULT BLOOD, GUAIAC: Fecal Occult Blood: NEGATIVE

## 2021-08-11 LAB — FECAL OCCULT BLOOD, IMMUNOCHEMICAL: Fecal Occult Bld: NEGATIVE

## 2021-08-12 ENCOUNTER — Encounter: Payer: Self-pay | Admitting: Family Medicine

## 2021-08-19 ENCOUNTER — Telehealth: Payer: Self-pay

## 2021-08-19 MED ORDER — ATENOLOL 25 MG PO TABS: ORAL_TABLET | ORAL | 3 refills | Status: DC

## 2021-08-19 MED ORDER — LISINOPRIL 20 MG PO TABS
ORAL_TABLET | ORAL | 3 refills | Status: DC
Start: 2021-08-19 — End: 2022-08-13

## 2021-08-19 NOTE — Telephone Encounter (Signed)
E-scribed refills.  

## 2021-08-24 ENCOUNTER — Telehealth: Payer: Self-pay | Admitting: Family Medicine

## 2021-08-24 MED ORDER — PRAVASTATIN SODIUM 80 MG PO TABS: ORAL_TABLET | ORAL | 3 refills | Status: DC

## 2021-08-24 NOTE — Telephone Encounter (Signed)
Patient notified by telephone that script has been sent to the pharmacy. 

## 2021-08-24 NOTE — Telephone Encounter (Signed)
Refill sent to pharmacy electronically. °

## 2021-08-24 NOTE — Telephone Encounter (Signed)
?  Encourage patient to contact the pharmacy for refills or they can request refills through [facility] ? ?LAST APPOINTMENT DATE:  Please schedule appointment if longer than 1 year ? ?NEXT APPOINTMENT DATE: ? ?MEDICATION:pravastatin (PRAVACHOL) 80 MG tablet ? ?Is the patient out of medication?  ? ?PHARMACY:[facility] [place], [place] AT [address] ? ?Let patient know to contact pharmacy at the end of the day to make sure medication is ready. ? ?Please notify patient to allow 48-72 hours to process ? ?CLINICAL FILLS OUT ALL BELOW:  ? ?LAST REFILL: ? ?QTY: ? ?REFILL DATE: ? ? ? ?OTHER COMMENTS:  ? ? ?Okay for refill? ? ?Please advise ? ? ?  ?

## 2021-09-03 ENCOUNTER — Other Ambulatory Visit: Payer: Self-pay

## 2021-09-03 MED ORDER — TIZANIDINE HCL 2 MG PO TABS: ORAL_TABLET | ORAL | 0 refills | Status: DC

## 2021-09-03 NOTE — Telephone Encounter (Signed)
Pt called for refill of tizanidine sent to [facility]. ?Last filled 08-07-21 #30 ?Last OV 08-07-21 ?Next OV 08-02-22 ?

## 2021-09-14 ENCOUNTER — Telehealth: Payer: Self-pay | Admitting: Family Medicine

## 2021-09-14 NOTE — Telephone Encounter (Signed)
?  Encourage patient to contact the pharmacy for refills or they can request refills through [facility] ? ?Did the patient contact the pharmacy:  N ? ? ?LAST APPOINTMENT DATE:  2.24.23 ?NEXT APPOINTMENT DATE:  not scheduled ? ?MEDICATION: amLODipine (NORVASC) 10 MG tablet  ? ?Cholecalciferol (VITAMIN D3) 1.25 MG (50000 UT) TABS ? ?Is the patient out of medication? Y (amlodopine) ? ?If not, how much is left? None ? ?Is this a 90 day supply:  ? ?PHARMACY:   ?[facility] [phone] - [place],  - [address] Phone:  [phone]  ?Fax:  [phone]  ?  ? ? ?Let patient know to contact pharmacy at the end of the day to make sure medication is ready. ? ?Please notify patient to allow 48-72 hours to process ?  ?

## 2021-09-15 MED ORDER — AMLODIPINE BESYLATE 10 MG PO TABS: ORAL_TABLET | ORAL | 1 refills | Status: DC

## 2021-09-15 NOTE — Telephone Encounter (Signed)
Patient advised by telephone that a refill on his Amlodipine is being sent to the pharmacy electronically. ?Patient was advised that a script for his Vitamin D3 was sent to the pharmacy on 08/07/21 #12/1 refill and they should have that on hold for him. Patient stated that he will reach out to the pharmacy and have them fill the Vitamin D3 for him. ?Patient was advised to call the office back if he has any problems with the refill. ?

## 2021-11-25 DIAGNOSIS — H26493 Other secondary cataract, bilateral: Secondary | ICD-10-CM | POA: Diagnosis not present

## 2021-11-25 DIAGNOSIS — H1045 Other chronic allergic conjunctivitis: Secondary | ICD-10-CM | POA: Diagnosis not present

## 2021-11-25 DIAGNOSIS — Z961 Presence of intraocular lens: Secondary | ICD-10-CM | POA: Diagnosis not present

## 2021-11-25 LAB — HM DIABETES EYE EXAM

## 2021-12-04 ENCOUNTER — Encounter: Payer: Self-pay | Admitting: Podiatry

## 2021-12-04 ENCOUNTER — Ambulatory Visit: Payer: Medicare Other | Admitting: Podiatry

## 2021-12-04 DIAGNOSIS — M79674 Pain in right toe(s): Secondary | ICD-10-CM | POA: Diagnosis not present

## 2021-12-04 DIAGNOSIS — B351 Tinea unguium: Secondary | ICD-10-CM | POA: Insufficient documentation

## 2021-12-04 DIAGNOSIS — M79675 Pain in left toe(s): Secondary | ICD-10-CM | POA: Diagnosis not present

## 2021-12-04 DIAGNOSIS — N289 Disorder of kidney and ureter, unspecified: Secondary | ICD-10-CM

## 2021-12-04 NOTE — Progress Notes (Addendum)
This patient returns to my office for at risk foot care.  This patient requires this care by a professional since this patient will be at risk due to having diabetes  and [place].   Patient denies diabetes. This patient is unable to cut nails himself since the patient cannot reach his nails.These nails are painful walking and wearing shoes.  This patient presents for at risk foot care today.  General Appearance  Alert, conversant and in no acute stress.  Vascular  Dorsalis pedis and posterior tibial  pulses are weakly  palpable  bilaterally.  Capillary return is within normal limits  bilaterally. Temperature is within normal limits  bilaterally.  Neurologic  Senn-Weinstein monofilament wire test within normal limits/diminished   bilaterally. Muscle power within normal limits bilaterally.  Nails Thick disfigured discolored nails with subungual debris  from hallux to fifth toes bilaterally. No evidence of bacterial infection or drainage bilaterally.  Orthopedic  No limitations of motion  feet .  No crepitus or effusions noted.  No bony pathology or digital deformities noted.  Skin  normotropic skin with no porokeratosis noted bilaterally.  No signs of infections or ulcers noted.     Onychomycosis  Pain in right toes  Pain in left toes  Consent was obtained for treatment procedures.   Mechanical debridement of nails 1-5  bilaterally performed with a nail nipper.  Filed with dremel without incident.    Return office visit    3 months                  Told patient to return for periodic foot care and evaluation due to potential at risk complications.   Helane Gunther DPM

## 2022-03-02 ENCOUNTER — Telehealth: Payer: Self-pay

## 2022-03-02 MED ORDER — AMLODIPINE BESYLATE 10 MG PO TABS: ORAL_TABLET | ORAL | 1 refills | Status: DC

## 2022-03-02 NOTE — Telephone Encounter (Signed)
E-scribed refill.    Plz schedule diabetes f/u (was due 02/04/22).

## 2022-03-03 NOTE — Telephone Encounter (Signed)
Noted  

## 2022-03-03 NOTE — Telephone Encounter (Signed)
Called pt & Scheduled f/u for 03/22/22

## 2022-03-10 ENCOUNTER — Ambulatory Visit: Payer: Medicare Other | Admitting: Podiatry

## 2022-03-22 ENCOUNTER — Ambulatory Visit (INDEPENDENT_AMBULATORY_CARE_PROVIDER_SITE_OTHER): Payer: Medicare Other | Admitting: Family Medicine

## 2022-03-22 ENCOUNTER — Encounter: Payer: Self-pay | Admitting: Family Medicine

## 2022-03-22 VITALS — BP 126/72 | HR 70 | Temp 98.0°F | Ht 64.5 in | Wt 192.0 lb

## 2022-03-22 DIAGNOSIS — K5900 Constipation, unspecified: Secondary | ICD-10-CM

## 2022-03-22 DIAGNOSIS — M5442 Lumbago with sciatica, left side: Secondary | ICD-10-CM | POA: Diagnosis not present

## 2022-03-22 DIAGNOSIS — M5441 Lumbago with sciatica, right side: Secondary | ICD-10-CM | POA: Diagnosis not present

## 2022-03-22 DIAGNOSIS — I10 Essential (primary) hypertension: Secondary | ICD-10-CM | POA: Diagnosis not present

## 2022-03-22 DIAGNOSIS — E1169 Type 2 diabetes mellitus with other specified complication: Secondary | ICD-10-CM | POA: Diagnosis not present

## 2022-03-22 DIAGNOSIS — M545 Low back pain, unspecified: Secondary | ICD-10-CM | POA: Insufficient documentation

## 2022-03-22 LAB — POCT GLYCOSYLATED HEMOGLOBIN (HGB A1C): Hemoglobin A1C: 6 % — AB (ref 4.0–5.6)

## 2022-03-22 MED ORDER — TIZANIDINE HCL 2 MG PO TABS: ORAL_TABLET | ORAL | 0 refills | Status: DC

## 2022-03-22 NOTE — Progress Notes (Signed)
Patient ID: Jeffrey Villarreal, male    DOB: 1941-05-14, 81 y.o.   MRN: [id number]  This visit was conducted in person.  BP 126/72 (BP Location: Left Arm, Patient Position: Sitting, Cuff Size: Normal)   Pulse 70   Temp 98 F (36.7 C)   Ht 5' 4.5" (1.638 m)   Wt 192 lb (87.1 kg)   SpO2 97%   BMI 32.45 kg/m    CC: 6 mo f/u visit Subjective:   HPI: Jeffrey Villarreal is a 81 y.o. male presenting on 03/22/2022 for Diabetes (6 Month follow-up. Does not check blood sugar at home.)   1 mo h/o lower back pain since doing recent yard work. Better with walking - [facility]. Occasional shooting pain down legs, feet can get numb. No weakness, bowel/bladder incontinence. Some constipation managed with miralax 1 capful PRN and may try MOM.   DM - does not regularly check sugars. Compliant with antihyperglycemic regimen which includes: diet control. Denies low sugars or hypoglycemic symptoms. Denies paresthesias, blurry vision. Last diabetic eye exam 2021 - DUE. Glucometer brand: doesn't have one. Last foot exam: 2022 - DUE. DSME: completed remotely. Lab Results  Component Value Date   HGBA1C 6.0 (A) 03/22/2022   Diabetic Foot Exam - Simple   No data filed    Lab Results  Component Value Date   MICROALBUR 3.9 (H) 07/31/2021         Relevant past medical, surgical, family and social history reviewed and updated as indicated. Interim medical history since our last visit reviewed. Allergies and medications reviewed and updated. Outpatient Medications Prior to Visit  Medication Sig Dispense Refill   amLODipine (NORVASC) 10 MG tablet TAKE 1 TABLET(10 MG) BY MOUTH DAILY 90 tablet 1   aspirin EC 81 MG tablet Take 81 mg by mouth daily.     atenolol (TENORMIN) 25 MG tablet TAKE 1 TABLET(25 MG) BY MOUTH DAILY 90 tablet 3   Cholecalciferol (VITAMIN D3) 1.25 MG (50000 UT) TABS Take 1 tablet by mouth once a week. 12 tablet 1   lisinopril (ZESTRIL) 20 MG tablet  TAKE 1 TABLET(20 MG) BY MOUTH DAILY 90 tablet 3   polyethylene glycol powder (GLYCOLAX/MIRALAX) powder Take 17 g by mouth daily as needed for moderate constipation. 850 g 1   pravastatin (PRAVACHOL) 80 MG tablet TAKE 1 TABLET BY MOUTH EVERY DAY. DISCONTINUE LIPITOR 90 tablet 3   tiZANidine (ZANAFLEX) 2 MG tablet TAKE 1 TABLET BY MOUTH TWICE DAILY AS NEEDED FOR MUSCLE SPASMS, MAY CAUSE DROWSINESS 30 tablet 0   Turmeric 500 MG CAPS Take 1 capsule by mouth in the morning, at noon, and at bedtime. (Patient not taking: Reported on 03/22/2022)     No facility-administered medications prior to visit.     Per HPI unless specifically indicated in ROS section below Review of Systems  Objective:  BP 126/72 (BP Location: Left Arm, Patient Position: Sitting, Cuff Size: Normal)   Pulse 70   Temp 98 F (36.7 C)   Ht 5' 4.5" (1.638 m)   Wt 192 lb (87.1 kg)   SpO2 97%   BMI 32.45 kg/m   Wt Readings from Last 3 Encounters:  03/22/22 192 lb (87.1 kg)  08/07/21 190 lb 2 oz (86.2 kg)  07/30/21 194 lb (88 kg)      Physical Exam Vitals and nursing note reviewed.  Constitutional:      Appearance: Normal appearance. He is not ill-appearing.  Eyes:  Extraocular Movements: Extraocular movements intact.     Conjunctiva/sclera: Conjunctivae normal.     Pupils: Pupils are equal, round, and reactive to light.  Cardiovascular:     Rate and Rhythm: Normal rate and regular rhythm.     Pulses: Normal pulses.     Heart sounds: Murmur (mild systolic) heard.  Pulmonary:     Effort: Pulmonary effort is normal. No respiratory distress.     Breath sounds: Normal breath sounds. No wheezing, rhonchi or rales.  Musculoskeletal:     Right lower leg: No edema.     Left lower leg: No edema.     Comments:  See HPI for foot exam if done No midline lumbar spine pain No paraspinous mm tenderness  Skin:    General: Skin is warm and dry.     Findings: No rash.  Neurological:     Mental Status: He is alert.   Psychiatric:        Mood and Affect: Mood normal.        Behavior: Behavior normal.       Results for orders placed or performed in visit on 03/22/22  HgB A1c  Result Value Ref Range   Hemoglobin A1C 6.0 (A) 4.0 - 5.6 %   HbA1c POC (<> result, manual entry)     HbA1c, POC (prediabetic range)     HbA1c, POC (controlled diabetic range)      Assessment & Plan:   Problem List Items Addressed This Visit     Hypertension    Chronic, stable. Continue current regimen.       Type 2 diabetes mellitus with other specified complication (HCC) - Primary    Chronic, stable, diet controlled. Congratulated on control to date.  Will request latest eye exam from Dr Katy Fitch.       Relevant Orders   HgB A1c (Completed)   Constipation    Discussed miralax dosing - rec increase from twice weekly to 4-5 times weekly. He will also try MOM.       Low back pain    Ongoing for 1 month, with discomfort down posterior thighs when he gets up and walks. ?lumbar stenosis. No claudication symptoms. rec conservative management, he will continue zanaflex PRN. Update if ongoing past next 2-3 weeks to consider lumbar imaging.       Relevant Medications   tiZANidine (ZANAFLEX) 2 MG tablet     Meds ordered this encounter  Medications   tiZANidine (ZANAFLEX) 2 MG tablet    Sig: TAKE 1 TABLET BY MOUTH TWICE DAILY AS NEEDED FOR MUSCLE SPASMS, MAY CAUSE DROWSINESS    Dispense:  30 tablet    Refill:  0   Orders Placed This Encounter  Procedures   HgB A1c     Patient Instructions  We will request latest diabetic eye exam from Dr Katy Fitch.  Check with pharmacy on insurance's preferred glucose meter brand.  Use lotrimin antifungal between 4/5th toes after patting dry after shower.  You are doing well today.  Zanaflex muscle relaxant refilled Return as needed or in 5 months for physical/wellness visit.   Follow up plan: Return in about 5 months (around 08/21/2022) for annual exam, prior fasting for blood  work, medicare wellness visit.  Ria Bush, MD

## 2022-03-22 NOTE — Assessment & Plan Note (Addendum)
Chronic, stable, diet controlled. Congratulated on control to date.  Will request latest eye exam from Dr Katy Fitch.

## 2022-03-22 NOTE — Patient Instructions (Addendum)
We will request latest diabetic eye exam from Dr Katy Fitch.  Check with pharmacy on insurance's preferred glucose meter brand.  Use lotrimin antifungal between 4/5th toes after patting dry after shower.  You are doing well today.  Zanaflex muscle relaxant refilled Return as needed or in 5 months for physical/wellness visit.

## 2022-03-22 NOTE — Assessment & Plan Note (Signed)
Chronic, stable. Continue current regimen. 

## 2022-03-22 NOTE — Assessment & Plan Note (Signed)
Discussed miralax dosing - rec increase from twice weekly to 4-5 times weekly. He will also try MOM.

## 2022-03-22 NOTE — Assessment & Plan Note (Signed)
Ongoing for 1 month, with discomfort down posterior thighs when he gets up and walks. ?lumbar stenosis. No claudication symptoms. rec conservative management, he will continue zanaflex PRN. Update if ongoing past next 2-3 weeks to consider lumbar imaging.

## 2022-03-29 ENCOUNTER — Ambulatory Visit: Payer: Medicare Other | Admitting: Podiatry

## 2022-04-26 ENCOUNTER — Ambulatory Visit: Payer: Medicare Other | Admitting: Podiatry

## 2022-05-17 ENCOUNTER — Ambulatory Visit: Payer: Medicare Other | Admitting: Podiatry

## 2022-05-17 ENCOUNTER — Encounter: Payer: Self-pay | Admitting: Podiatry

## 2022-05-17 VITALS — BP 184/85 | HR 60

## 2022-05-17 DIAGNOSIS — B351 Tinea unguium: Secondary | ICD-10-CM

## 2022-05-17 DIAGNOSIS — M79674 Pain in right toe(s): Secondary | ICD-10-CM

## 2022-05-17 DIAGNOSIS — N289 Disorder of kidney and ureter, unspecified: Secondary | ICD-10-CM

## 2022-05-17 DIAGNOSIS — M79675 Pain in left toe(s): Secondary | ICD-10-CM

## 2022-05-17 DIAGNOSIS — E1169 Type 2 diabetes mellitus with other specified complication: Secondary | ICD-10-CM

## 2022-05-17 NOTE — Progress Notes (Signed)
This patient returns to my office for at risk foot care.  This patient requires this care by a professional since this patient will be at risk due to having diabetes  and [place].   Patient denies diabetes. This patient is unable to cut nails himself since the patient cannot reach his nails.These nails are painful walking and wearing shoes.   Patient has not been seen in over 6 months. This patient presents for at risk foot care today.  General Appearance  Alert, conversant and in no acute stress.  Vascular  Dorsalis pedis and posterior tibial  pulses are weakly  palpable  bilaterally.  Capillary return is within normal limits  bilaterally. Temperature is within normal limits  bilaterally.  Neurologic  Senn-Weinstein monofilament wire test within normal limits/diminished   bilaterally. Muscle power within normal limits bilaterally.  Nails Thick disfigured discolored nails with subungual debris  from hallux to fifth toes bilaterally. No evidence of bacterial infection or drainage bilaterally.  Orthopedic  No limitations of motion  feet .  No crepitus or effusions noted.  No bony pathology or digital deformities noted.  Skin  normotropic skin with no porokeratosis noted bilaterally.  No signs of infections or ulcers noted.     Onychomycosis  Pain in right toes  Pain in left toes  Consent was obtained for treatment procedures.   Mechanical debridement of nails 1-5  bilaterally performed with a nail nipper.  Filed with dremel without incident.    Return office visit    3 months                  Told patient to return for periodic foot care and evaluation due to potential at risk complications.   Oluwatosin Higginson DPM   

## 2022-07-22 ENCOUNTER — Ambulatory Visit: Payer: Medicare Other | Admitting: Orthopedic Surgery

## 2022-08-02 ENCOUNTER — Ambulatory Visit (INDEPENDENT_AMBULATORY_CARE_PROVIDER_SITE_OTHER): Payer: Medicare Other

## 2022-08-02 VITALS — Ht 64.5 in | Wt 192.0 lb

## 2022-08-02 DIAGNOSIS — Z0000 Encounter for general adult medical examination without abnormal findings: Secondary | ICD-10-CM

## 2022-08-02 NOTE — Patient Instructions (Signed)
Mr. Jeffrey Villarreal , Thank you for taking time to come for your Medicare Wellness Visit. I appreciate your ongoing commitment to your health goals. Please review the following plan we discussed and let me know if I can assist you in the future.   These are the goals we discussed:  Goals      Patient Stated     Starting 11/22/18, I will continue to take medications as prescribed.      Patient Stated     07/29/2020, I will continue to do Silver Sneakers at the gym 1 day a week for about 1 hour.      Patient Stated     Would like to maintain current goal      Patient Stated     Like to go fishing.        This is a list of the screening recommended for you and due dates:  Health Maintenance  Topic Date Due   Zoster (Shingles) Vaccine (1 of 2) Never done   DTaP/Tdap/Td vaccine (2 - Tdap) 06/14/2017   Eye exam for diabetics  08/12/2020   Pneumonia Vaccine (2 of 2 - PCV) 08/05/2021   COVID-19 Vaccine (4 - 2023-24 season) 02/12/2022   Yearly kidney function blood test for diabetes  07/31/2022   Yearly kidney health urinalysis for diabetes  07/31/2022   Flu Shot  09/12/2022*   Hemoglobin A1C  09/21/2022   Complete foot exam   05/18/2023   Medicare Annual Wellness Visit  08/03/2023   HPV Vaccine  Aged Out  *Topic was postponed. The date shown is not the original due date.    Advanced directives: Advance directive discussed with you today. Even though you declined this today, please call our office should you change your mind, and we can give you the proper paperwork for you to fill out.   Conditions/risks identified: Aim for 30 minutes of exercise or brisk walking, 6-8 glasses of water, and 5 servings of fruits and vegetables each day.   Next appointment: Follow up in one year for your annual wellness visit. 08/04/2023 @ 2:00 via telephone.  Preventive Care 82 Years and Older, Male  Preventive care refers to lifestyle choices and visits with your health care provider that can promote  health and wellness. What does preventive care include? A yearly physical exam. This is also called an annual well check. Dental exams once or twice a year. Routine eye exams. Ask your health care provider how often you should have your eyes checked. Personal lifestyle choices, including: Daily care of your teeth and gums. Regular physical activity. Eating a healthy diet. Avoiding tobacco and drug use. Limiting alcohol use. Practicing safe sex. Taking low doses of aspirin every day. Taking vitamin and mineral supplements as recommended by your health care provider. What happens during an annual well check? The services and screenings done by your health care provider during your annual well check will depend on your age, overall health, lifestyle risk factors, and family history of disease. Counseling  Your health care provider may ask you questions about your: Alcohol use. Tobacco use. Drug use. Emotional well-being. Home and relationship well-being. Sexual activity. Eating habits. History of falls. Memory and ability to understand (cognition). Work and work Statistician. Screening  You may have the following tests or measurements: Height, weight, and BMI. Blood pressure. Lipid and cholesterol levels. These may be checked every 5 years, or more frequently if you are over 24 years old. Skin check. Lung cancer screening. You may  have this screening every year starting at age 25 if you have a 30-pack-year history of smoking and currently smoke or have quit within the past 15 years. Fecal occult blood test (FOBT) of the stool. You may have this test every year starting at age 28. Flexible sigmoidoscopy or colonoscopy. You may have a sigmoidoscopy every 5 years or a colonoscopy every 10 years starting at age 10. Prostate cancer screening. Recommendations will vary depending on your family history and other risks. Hepatitis C blood test. Hepatitis B blood test. Sexually transmitted  disease (STD) testing. Diabetes screening. This is done by checking your blood sugar (glucose) after you have not eaten for a while (fasting). You may have this done every 1-3 years. Abdominal aortic aneurysm (AAA) screening. You may need this if you are a current or former smoker. Osteoporosis. You may be screened starting at age 47 if you are at high risk. Talk with your health care provider about your test results, treatment options, and if necessary, the need for more tests. Vaccines  Your health care provider may recommend certain vaccines, such as: Influenza vaccine. This is recommended every year. Tetanus, diphtheria, and acellular pertussis (Tdap, Td) vaccine. You may need a Td booster every 10 years. Zoster vaccine. You may need this after age 66. Pneumococcal 13-valent conjugate (PCV13) vaccine. One dose is recommended after age 34. Pneumococcal polysaccharide (PPSV23) vaccine. One dose is recommended after age 82. Talk to your health care provider about which screenings and vaccines you need and how often you need them. This information is not intended to replace advice given to you by your health care provider. Make sure you discuss any questions you have with your health care provider. Document Released: 06/27/2015 Document Revised: 02/18/2016 Document Reviewed: 04/01/2015 Elsevier Interactive Patient Education  2017 [place] Prevention in the Home Falls can cause injuries. They can happen to people of all ages. There are many things you can do to make your home safe and to help prevent falls. What can I do on the outside of my home? Regularly fix the edges of walkways and driveways and fix any cracks. Remove anything that might make you trip as you walk through a door, such as a raised step or threshold. Trim any bushes or trees on the path to your home. Use bright outdoor lighting. Clear any walking paths of anything that might make someone trip, such as rocks or  tools. Regularly check to see if handrails are loose or broken. Make sure that both sides of any steps have handrails. Any raised decks and porches should have guardrails on the edges. Have any leaves, snow, or ice cleared regularly. Use sand or salt on walking paths during winter. Clean up any spills in your garage right away. This includes oil or grease spills. What can I do in the bathroom? Use night lights. Install grab bars by the toilet and in the tub and shower. Do not use towel bars as grab bars. Use non-skid mats or decals in the tub or shower. If you need to sit down in the shower, use a plastic, non-slip stool. Keep the floor dry. Clean up any water that spills on the floor as soon as it happens. Remove soap buildup in the tub or shower regularly. Attach bath mats securely with double-sided non-slip rug tape. Do not have throw rugs and other things on the floor that can make you trip. What can I do in the bedroom? Use night lights. Make sure  that you have a light by your bed that is easy to reach. Do not use any sheets or blankets that are too big for your bed. They should not hang down onto the floor. Have a firm chair that has side arms. You can use this for support while you get dressed. Do not have throw rugs and other things on the floor that can make you trip. What can I do in the kitchen? Clean up any spills right away. Avoid walking on wet floors. Keep items that you use a lot in easy-to-reach places. If you need to reach something above you, use a strong step stool that has a grab bar. Keep electrical cords out of the way. Do not use floor polish or wax that makes floors slippery. If you must use wax, use non-skid floor wax. Do not have throw rugs and other things on the floor that can make you trip. What can I do with my stairs? Do not leave any items on the stairs. Make sure that there are handrails on both sides of the stairs and use them. Fix handrails that are  broken or loose. Make sure that handrails are as long as the stairways. Check any carpeting to make sure that it is firmly attached to the stairs. Fix any carpet that is loose or worn. Avoid having throw rugs at the top or bottom of the stairs. If you do have throw rugs, attach them to the floor with carpet tape. Make sure that you have a light switch at the top of the stairs and the bottom of the stairs. If you do not have them, ask someone to add them for you. What else can I do to help prevent falls? Wear shoes that: Do not have high heels. Have rubber bottoms. Are comfortable and fit you well. Are closed at the toe. Do not wear sandals. If you use a stepladder: Make sure that it is fully opened. Do not climb a closed stepladder. Make sure that both sides of the stepladder are locked into place. Ask someone to hold it for you, if possible. Clearly mark and make sure that you can see: Any grab bars or handrails. First and last steps. Where the edge of each step is. Use tools that help you move around (mobility aids) if they are needed. These include: Canes. Walkers. Scooters. Crutches. Turn on the lights when you go into a dark area. Replace any light bulbs as soon as they burn out. Set up your furniture so you have a clear path. Avoid moving your furniture around. If any of your floors are uneven, fix them. If there are any pets around you, be aware of where they are. Review your medicines with your doctor. Some medicines can make you feel dizzy. This can increase your chance of falling. Ask your doctor what other things that you can do to help prevent falls. This information is not intended to replace advice given to you by your health care provider. Make sure you discuss any questions you have with your health care provider. Document Released: 03/27/2009 Document Revised: 11/06/2015 Document Reviewed: 07/05/2014 Elsevier Interactive Patient Education  2017 [employer].

## 2022-08-02 NOTE — Progress Notes (Signed)
I connected with  Jeffrey Villarreal on 08/02/22 by a audio enabled telemedicine application and verified that I am speaking with the correct person using two identifiers.  Patient Location: Home  Provider Location: Home Office  I discussed the limitations of evaluation and management by telemedicine. The patient expressed understanding and agreed to proceed.  Subjective:   Jeffrey Villarreal is a 82 y.o. male who presents for Medicare Annual/Subsequent preventive examination.  Review of Systems      Cardiac Risk Factors include: advanced age (>4mn, >>68women);hypertension;male gender;diabetes mellitus;dyslipidemia     Objective:    Today's Vitals   08/02/22 1422  Weight: 192 lb (87.1 kg)  Height: 5' 4.5" (1.638 m)   Body mass index is 32.45 kg/m.     08/02/2022    2:33 PM 07/29/2020    1:13 PM 11/22/2018    9:34 AM 03/28/2014   10:43 PM  Advanced Directives  Does Patient Have a Medical Advance Directive? No No Yes No  Type of AScientist, physiologicalof [place] will   Copy of [place]?   No - copy requested   Would patient like information on creating a medical advance directive? No - Patient declined No - Patient declined  No - patient declined information    Current Medications (verified) Outpatient Encounter Medications as of 08/02/2022  Medication Sig   amLODipine (NORVASC) 10 MG tablet TAKE 1 TABLET(10 MG) BY MOUTH DAILY   aspirin EC 81 MG tablet Take 81 mg by mouth daily.   atenolol (TENORMIN) 25 MG tablet TAKE 1 TABLET(25 MG) BY MOUTH DAILY   lisinopril (ZESTRIL) 20 MG tablet TAKE 1 TABLET(20 MG) BY MOUTH DAILY   pravastatin (PRAVACHOL) 80 MG tablet TAKE 1 TABLET BY MOUTH EVERY DAY. DISCONTINUE LIPITOR   tiZANidine (ZANAFLEX) 2 MG tablet TAKE 1 TABLET BY MOUTH TWICE DAILY AS NEEDED FOR MUSCLE SPASMS, MAY CAUSE DROWSINESS   Cholecalciferol (VITAMIN D3) 1.25 MG (50000 UT) TABS Take 1 tablet by mouth once a week. (Patient  not taking: Reported on 08/02/2022)   polyethylene glycol powder (GLYCOLAX/MIRALAX) powder Take 17 g by mouth daily as needed for moderate constipation. (Patient not taking: Reported on 08/02/2022)   Turmeric 500 MG CAPS Take 1 capsule by mouth in the morning, at noon, and at bedtime. (Patient not taking: Reported on 03/22/2022)   No facility-administered encounter medications on file as of 08/02/2022.    Allergies (verified) Patient has no known allergies.   History: Past Medical History:  Diagnosis Date   Carotid stenosis 08/2014   1-39% bilateral, rpt [place] y[place]   Diet-controlled type 2 diabetes mellitus ([place]    DSME 02/2013   History of seizures as a child remote   Hyperlipidemia    Hypertension    Past Surgical History:  Procedure Laterality Date   CATARACT EXTRACTION Right 2013   COLONOSCOPY  08/2009   large int hemorrhoids, benign lymphoid polyp, rpt due 10 yrs ([place])   DOBUTAMINE STRESS ECHO  2016   WNL ([place]   Family History  Problem Relation Age of Onset   Hypertension Mother    Hypertension Father    Cancer Sister        lung   Cancer Sister        lung   Coronary artery disease Neg Hx    Stroke Neg Hx    Diabetes Neg Hx    Kidney disease Neg Hx    Hyperlipidemia Neg Hx  Social History   Socioeconomic History   Marital status: Married    Spouse name: Not on file   Number of children: 7   Years of education: 1th grade   Highest education level: Not on file  Occupational History   Occupation: retired    Fish farm manager: Retired    Comment: used to work at [employer]  Tobacco Use   Smoking status: Former    Types: Cigarettes    Quit date: 06/14/1985    Years since quitting: 37.1    Passive exposure: Past   Smokeless tobacco: Never  Vaping Use   Vaping Use: Never used  Substance and Sexual Activity   Alcohol use: No    Comment: Quit 1987   Drug use: No   Sexual activity: Not Currently  Other Topics Concern   Not on file  Social History Narrative    Caffeine: 1 cup coffee.   Lives with wife, step-daughter (40yo), no pets   Occupation: retired   Activity: walks daily   Diet: good water, fruits/vegetables daily   Social Determinants of Health   Financial Resource Strain: Low Risk  (08/02/2022)   Overall Financial Resource Strain (CARDIA)    Difficulty of Paying Living Expenses: Not hard at all  Food Insecurity: No Food Insecurity (08/02/2022)   Hunger Vital Sign    Worried About Running Out of Food in the Last Year: Never true    [place] in the Last Year: Never true  Transportation Needs: No Transportation Needs (08/02/2022)   PRAPARE - Hydrologist (Medical): No    Lack of Transportation (Non-Medical): No  Physical Activity: Inactive (08/02/2022)   Exercise Vital Sign    Days of Exercise per Week: 0 days    Minutes of Exercise per Session: 0 min  Stress: No Stress Concern Present (08/02/2022)   [place]    Feeling of Stress : Not at all  Social Connections: Moderately Isolated (08/02/2022)   Social Connection and Isolation Panel [NHANES]    Frequency of Communication with Friends and Family: More than three times a week    Frequency of Social Gatherings with Friends and Family: Once a week    Attends Religious Services: Never    Marine scientist or Organizations: No    Attends Music therapist: Never    Marital Status: Married    Tobacco Counseling Counseling given: Not Answered   Clinical Intake:  Pre-visit preparation completed: Yes  Pain : No/denies pain     Nutritional Risks: None Diabetes: Yes CBG done?: No Did pt. bring in CBG monitor from home?: No  How often do you need to have someone help you when you read instructions, pamphlets, or other written materials from your doctor or pharmacy?: 1 - Never  Diabetic?Nutrition Risk Assessment:  Has the patient had any N/V/D within the  last 2 months?  No  Does the patient have any non-healing wounds?  No  Has the patient had any unintentional weight loss or weight gain?  No   Diabetes:  Is the patient diabetic?  Yes  If diabetic, was a CBG obtained today?  No  Did the patient bring in their glucometer from home?  No  How often do you monitor your CBG's? Pt not checking..   Financial Strains and Diabetes Management:  Are you having any financial strains with the device, your supplies or your medication? No .  Does  the patient want to be seen by Chronic Care Management for management of their diabetes?  No  Would the patient like to be referred to a Nutritionist or for Diabetic Management?  No   Diabetic Exams:  Diabetic Eye Exam: Completed by Dr.Groat  in 2023 Diabetic Foot Exam: Completed 05/17/2022 Dr.Mayer    Interpreter Needed?: No  Information entered by :: C.Deona Novitski LPN   Activities of Daily Living    08/02/2022    2:33 PM  In your present state of health, do you have any difficulty performing the following activities:  Hearing? 0  Vision? 0  Difficulty concentrating or making decisions? 0  Walking or climbing stairs? 0  Dressing or bathing? 0  Doing errands, shopping? 0  Preparing Food and eating ? N  Using the Toilet? N  In the past six months, have you accidently leaked urine? N  Do you have problems with loss of bowel control? N  Managing your Medications? N  Managing your Finances? N  Housekeeping or managing your Housekeeping? N    Patient Care Team: Ria Bush, MD as PCP - General (Family Medicine) Debbora Dus, [facility] as Pharmacist (Pharmacist)  Indicate any recent Medical Services you may have received from other than [facility] providers in the past year (date may be approximate).     Assessment:   This is a routine wellness examination for Nic.  Hearing/Vision screen Hearing Screening - Comments:: No aid Vision Screening - Comments:: Wears glasses - Dr.Groat  Dietary  issues and exercise activities discussed: Current Exercise Habits: The patient does not participate in regular exercise at present, Exercise limited by: None identified   Goals Addressed             This Visit's Progress    Patient Stated       Like to go fishing.       Depression Screen    08/02/2022    2:30 PM 07/30/2021    1:32 PM 07/29/2020    1:14 PM 11/22/2018    9:36 AM 09/14/2017   10:32 AM 03/16/2016   10:50 AM 02/25/2015    2:52 PM  PHQ 2/9 Scores  PHQ - 2 Score 0 0 0 0 0 0 0  PHQ- 9 Score   0 0       Fall Risk    08/02/2022    2:26 PM 07/30/2021    1:30 PM 07/29/2020    1:14 PM 11/22/2018    9:36 AM 09/14/2017   10:32 AM  Fall Risk   Falls in the past year? 0 0 0 0 No  Number falls in past yr: 0 0 0    Injury with Fall? 0 0 0    Risk for fall due to : No Fall Risks No Fall Risks Medication side effect    Follow up Falls prevention discussed;Falls evaluation completed Falls prevention discussed Falls evaluation completed;Falls prevention discussed      FALL RISK PREVENTION PERTAINING TO THE HOME:  Any stairs in or around the home? No  If so, are there any without handrails? No  Home free of loose throw rugs in walkways, pet beds, electrical cords, etc? Yes  Adequate lighting in your home to reduce risk of falls? Yes   ASSISTIVE DEVICES UTILIZED TO PREVENT FALLS:  Life alert? No  Use of a cane, walker or w/c? No  Grab bars in the bathroom? Yes  Shower chair or bench in shower? No  Elevated toilet seat or a handicapped toilet?  No   Cognitive Function:    07/29/2020    1:17 PM 11/22/2018    6:07 PM  MMSE - Mini Mental State Exam  Not completed: Refused   Orientation to time  5  Orientation to Place  5  Registration  3  Attention/ Calculation  0  Recall  3  Language- name 2 objects  0  Language- repeat  1  Language- follow 3 step command  0  Language- read & follow direction  0  Write a sentence  0  Copy design  0  Total score  17         08/02/2022    2:42 PM  6CIT Screen  What Year? 0 points  What month? 0 points  What time? 0 points  Count back from 20 0 points  Months in reverse 0 points  Repeat phrase 2 points  Total Score 2 points    Immunizations Immunization History  Administered Date(s) Administered   PFIZER(Purple Top)SARS-COV-2 Vaccination 09/15/2019, 10/10/2019, 05/21/2020   Pneumococcal Polysaccharide-23 08/05/2020   Td 06/15/2007    TDAP status: Due, Education has been provided regarding the importance of this vaccine. Advised may receive this vaccine at local pharmacy or Health Dept. Aware to provide a copy of the vaccination record if obtained from local pharmacy or Health Dept. Verbalized acceptance and understanding.  Flu Vaccine status: Declined, Education has been provided regarding the importance of this vaccine but patient still declined. Advised may receive this vaccine at local pharmacy or Health Dept. Aware to provide a copy of the vaccination record if obtained from local pharmacy or Health Dept. Verbalized acceptance and understanding.  Pneumococcal vaccine status: Declined,  Education has been provided regarding the importance of this vaccine but patient still declined. Advised may receive this vaccine at local pharmacy or Health Dept. Aware to provide a copy of the vaccination record if obtained from local pharmacy or Health Dept. Verbalized acceptance and understanding.   Covid-19 vaccine status: Declined, Education has been provided regarding the importance of this vaccine but patient still declined. Advised may receive this vaccine at local pharmacy or Health Dept.or vaccine clinic. Aware to provide a copy of the vaccination record if obtained from local pharmacy or Health Dept. Verbalized acceptance and understanding.  Qualifies for Shingles Vaccine? Yes   Zostavax completed No   Shingrix Completed?: No.    Education has been provided regarding the importance of this vaccine. Patient has  been advised to call insurance company to determine out of pocket expense if they have not yet received this vaccine. Advised may also receive vaccine at local pharmacy or Health Dept. Verbalized acceptance and understanding.  Screening Tests Health Maintenance  Topic Date Due   Zoster Vaccines- Shingrix (1 of 2) Never done   DTaP/Tdap/Td (2 - Tdap) 06/14/2017   OPHTHALMOLOGY EXAM  08/12/2020   Pneumonia Vaccine 72+ Years old (2 of 2 - PCV) 08/05/2021   COVID-19 Vaccine (4 - 2023-24 season) 02/12/2022   Diabetic kidney evaluation - eGFR measurement  07/31/2022   Diabetic kidney evaluation - Urine ACR  07/31/2022   INFLUENZA VACCINE  09/12/2022 (Originally 01/12/2022)   HEMOGLOBIN A1C  09/21/2022   FOOT EXAM  05/18/2023   Medicare Annual Wellness (AWV)  08/03/2023   HPV VACCINES  Aged Out    Health Maintenance  Health Maintenance Due  Topic Date Due   Zoster Vaccines- Shingrix (1 of 2) Never done   DTaP/Tdap/Td (2 - Tdap) 06/14/2017   OPHTHALMOLOGY EXAM  08/12/2020  Pneumonia Vaccine 18+ Years old (2 of 2 - PCV) 08/05/2021   COVID-19 Vaccine (4 - 2023-24 season) 02/12/2022   Diabetic kidney evaluation - eGFR measurement  07/31/2022   Diabetic kidney evaluation - Urine ACR  07/31/2022    Colorectal cancer screening: No longer required. Pt will call for appt.  Lung Cancer Screening: (Low Dose CT Chest recommended if Age 72-80 years, 30 pack-year currently smoking OR have quit w/in 15years.) does not qualify.   Lung Cancer Screening Referral: no  Additional Screening:  Hepatitis C Screening: does not qualify; Completed not done  Vision Screening: Recommended annual ophthalmology exams for early detection of glaucoma and other disorders of the eye. Is the patient up to date with their annual eye exam?  Yes  Who is the provider or what is the name of the office in which the patient attends annual eye exams? Dr.Groat If pt is not established with a provider, would they like to be  referred to a provider to establish care? No .   Dental Screening: Recommended annual dental exams for proper oral hygiene  Community Resource Referral / Chronic Care Management: CRR required this visit?  No   CCM required this visit?  No      Plan:     I have personally reviewed and noted the following in the patient's chart:   Medical and social history Use of alcohol, tobacco or illicit drugs  Current medications and supplements including opioid prescriptions. Patient is not currently taking opioid prescriptions. Functional ability and status Nutritional status Physical activity Advanced directives List of other physicians Hospitalizations, surgeries, and ER visits in previous 12 months Vitals Screenings to include cognitive, depression, and falls Referrals and appointments  In addition, I have reviewed and discussed with patient certain preventive protocols, quality metrics, and best practice recommendations. A written personalized care plan for preventive services as well as general preventive health recommendations were provided to patient.     Lebron Conners, LPN   [id number]   Nurse Notes: Vaccinations: declines all Influenza vaccine: recommend every Fall Pneumococcal vaccine: recommend once per lifetime Prevnar-20 Tdap vaccine: recommend every 10 years Shingles vaccine: recommend Shingrix which is 2 doses 2-6 months apart and over 90% effective     Covid-19: recommend 2 doses one month apart with a booster 6 months later  Pt declined colonoscopy.

## 2022-08-13 ENCOUNTER — Other Ambulatory Visit: Payer: Self-pay | Admitting: Family Medicine

## 2022-08-13 DIAGNOSIS — I10 Essential (primary) hypertension: Secondary | ICD-10-CM

## 2022-08-13 DIAGNOSIS — E1169 Type 2 diabetes mellitus with other specified complication: Secondary | ICD-10-CM

## 2022-08-18 ENCOUNTER — Encounter: Payer: Self-pay | Admitting: Podiatry

## 2022-08-18 ENCOUNTER — Ambulatory Visit: Payer: Medicare Other | Admitting: Podiatry

## 2022-08-18 DIAGNOSIS — E1169 Type 2 diabetes mellitus with other specified complication: Secondary | ICD-10-CM | POA: Diagnosis not present

## 2022-08-18 DIAGNOSIS — M79675 Pain in left toe(s): Secondary | ICD-10-CM

## 2022-08-18 DIAGNOSIS — B351 Tinea unguium: Secondary | ICD-10-CM

## 2022-08-18 DIAGNOSIS — M79674 Pain in right toe(s): Secondary | ICD-10-CM

## 2022-08-18 NOTE — Progress Notes (Signed)
This patient returns to my office for at risk foot care.  This patient requires this care by a professional since this patient will be at risk due to having diabetes  and [place].   Patient denies diabetes. This patient is unable to cut nails himself since the patient cannot reach his nails.These nails are painful walking and wearing shoes.   Patient has not been seen in over 6 months. This patient presents for at risk foot care today.  General Appearance  Alert, conversant and in no acute stress.  Vascular  Dorsalis pedis and posterior tibial  pulses are weakly  palpable  bilaterally.  Capillary return is within normal limits  bilaterally. Temperature is within normal limits  bilaterally.  Neurologic  Senn-Weinstein monofilament wire test within normal limits/diminished   bilaterally. Muscle power within normal limits bilaterally.  Nails Thick disfigured discolored nails with subungual debris  from hallux to fifth toes bilaterally. No evidence of bacterial infection or drainage bilaterally.  Orthopedic  No limitations of motion  feet .  No crepitus or effusions noted.  No bony pathology or digital deformities noted.  Skin  normotropic skin with no porokeratosis noted bilaterally.  No signs of infections or ulcers noted.     Onychomycosis  Pain in right toes  Pain in left toes  Consent was obtained for treatment procedures.   Mechanical debridement of nails 1-5  bilaterally performed with a nail nipper.  Filed with dremel without incident.    Return office visit    3 months                  Told patient to return for periodic foot care and evaluation due to potential at risk complications.   Gardiner Barefoot DPM

## 2022-10-13 ENCOUNTER — Ambulatory Visit (INDEPENDENT_AMBULATORY_CARE_PROVIDER_SITE_OTHER): Payer: Medicare Other | Admitting: Family Medicine

## 2022-10-13 ENCOUNTER — Encounter: Payer: Self-pay | Admitting: Family Medicine

## 2022-10-13 VITALS — BP 148/80 | HR 58 | Temp 97.2°F | Ht 64.5 in | Wt 186.5 lb

## 2022-10-13 DIAGNOSIS — R011 Cardiac murmur, unspecified: Secondary | ICD-10-CM

## 2022-10-13 DIAGNOSIS — I10 Essential (primary) hypertension: Secondary | ICD-10-CM | POA: Diagnosis not present

## 2022-10-13 DIAGNOSIS — E559 Vitamin D deficiency, unspecified: Secondary | ICD-10-CM | POA: Diagnosis not present

## 2022-10-13 DIAGNOSIS — I6523 Occlusion and stenosis of bilateral carotid arteries: Secondary | ICD-10-CM | POA: Diagnosis not present

## 2022-10-13 DIAGNOSIS — E785 Hyperlipidemia, unspecified: Secondary | ICD-10-CM

## 2022-10-13 DIAGNOSIS — Z0000 Encounter for general adult medical examination without abnormal findings: Secondary | ICD-10-CM

## 2022-10-13 DIAGNOSIS — E538 Deficiency of other specified B group vitamins: Secondary | ICD-10-CM | POA: Diagnosis not present

## 2022-10-13 DIAGNOSIS — Z7189 Other specified counseling: Secondary | ICD-10-CM | POA: Diagnosis not present

## 2022-10-13 DIAGNOSIS — Z23 Encounter for immunization: Secondary | ICD-10-CM | POA: Diagnosis not present

## 2022-10-13 DIAGNOSIS — E1169 Type 2 diabetes mellitus with other specified complication: Secondary | ICD-10-CM | POA: Diagnosis not present

## 2022-10-13 DIAGNOSIS — N289 Disorder of kidney and ureter, unspecified: Secondary | ICD-10-CM

## 2022-10-13 LAB — CBC WITH DIFFERENTIAL/PLATELET
Basophils Absolute: 0 10*3/uL (ref 0.0–0.1)
Basophils Relative: 0.6 % (ref 0.0–3.0)
Eosinophils Absolute: 0.4 10*3/uL (ref 0.0–0.7)
Eosinophils Relative: 6.4 % — ABNORMAL HIGH (ref 0.0–5.0)
HCT: 43.2 % (ref 39.0–52.0)
Hemoglobin: 14.7 g/dL (ref 13.0–17.0)
Lymphocytes Relative: 30.3 % (ref 12.0–46.0)
Lymphs Abs: 1.8 10*3/uL (ref 0.7–4.0)
MCHC: 34.1 g/dL (ref 30.0–36.0)
MCV: 83.8 fl (ref 78.0–100.0)
Monocytes Absolute: 0.5 10*3/uL (ref 0.1–1.0)
Monocytes Relative: 7.6 % (ref 3.0–12.0)
Neutro Abs: 3.3 10*3/uL (ref 1.4–7.7)
Neutrophils Relative %: 55.1 % (ref 43.0–77.0)
Platelets: 308 10*3/uL (ref 150.0–400.0)
RBC: 5.15 Mil/uL (ref 4.22–5.81)
RDW: 13.8 % (ref 11.5–15.5)
WBC: 6 10*3/uL (ref 4.0–10.5)

## 2022-10-13 LAB — COMPREHENSIVE METABOLIC PANEL
ALT: 19 U/L (ref 0–53)
AST: 26 U/L (ref 0–37)
Albumin: 4.3 g/dL (ref 3.5–5.2)
Alkaline Phosphatase: 90 U/L (ref 39–117)
BUN: 13 mg/dL (ref 6–23)
CO2: 29 mEq/L (ref 19–32)
Calcium: 9.5 mg/dL (ref 8.4–10.5)
Chloride: 104 mEq/L (ref 96–112)
Creatinine, Ser: 1.35 mg/dL (ref 0.40–1.50)
GFR: 49.09 mL/min — ABNORMAL LOW (ref >60.00)
Glucose, Bld: 97 mg/dL (ref 70–99)
Potassium: 4.3 mEq/L (ref 3.5–5.1)
Sodium: 140 mEq/L (ref 135–145)
Total Bilirubin: 0.5 mg/dL (ref 0.2–1.2)
Total Protein: 7.1 g/dL (ref 6.0–8.3)

## 2022-10-13 LAB — MICROALBUMIN / CREATININE URINE RATIO
Creatinine,U: 213.3 mg/dL
Microalb Creat Ratio: 1.1 mg/g (ref 0.0–30.0)
Microalb, Ur: 2.4 mg/dL — ABNORMAL HIGH (ref 0.0–1.9)

## 2022-10-13 LAB — LIPID PANEL
Cholesterol: 129 mg/dL (ref 0–200)
HDL: 41.6 mg/dL (ref >39.00)
LDL Cholesterol: 75 mg/dL (ref 0–99)
NonHDL: 87.45
Total CHOL/HDL Ratio: 3
Triglycerides: 64 mg/dL (ref 0.0–149.0)
VLDL: 12.8 mg/dL (ref 0.0–40.0)

## 2022-10-13 LAB — HEMOGLOBIN A1C: Hgb A1c MFr Bld: 6.3 % (ref 4.6–6.5)

## 2022-10-13 LAB — VITAMIN B12: Vitamin B-12: 183 pg/mL — ABNORMAL LOW (ref 211–911)

## 2022-10-13 LAB — VITAMIN D 25 HYDROXY (VIT D DEFICIENCY, FRACTURES): VITD: 16.41 ng/mL — ABNORMAL LOW (ref 30.00–100.00)

## 2022-10-13 LAB — CK: Total CK: 465 U/L — ABNORMAL HIGH (ref 7–232)

## 2022-10-13 MED ORDER — ATENOLOL 25 MG PO TABS: 25.0000 mg | ORAL_TABLET | Freq: Every day | ORAL | 4 refills | Status: DC

## 2022-10-13 MED ORDER — AMLODIPINE BESYLATE 10 MG PO TABS: 10.0000 mg | ORAL_TABLET | Freq: Every day | ORAL | 4 refills | Status: DC

## 2022-10-13 MED ORDER — VITAMIN D3 1.25 MG (50000 UT) PO TABS: 1.0000 | ORAL_TABLET | ORAL | 3 refills | Status: DC

## 2022-10-13 MED ORDER — PRAVASTATIN SODIUM 80 MG PO TABS: 80.0000 mg | ORAL_TABLET | Freq: Every day | ORAL | 4 refills | Status: DC

## 2022-10-13 MED ORDER — LISINOPRIL 20 MG PO TABS: 20.0000 mg | ORAL_TABLET | Freq: Every day | ORAL | 4 refills | Status: DC

## 2022-10-13 NOTE — Progress Notes (Signed)
Ph: [phone]       Fax: [phone]   Patient ID: Jeffrey Villarreal, male    DOB: 11/15/1940, 82 y.o.   MRN: [id number]  This visit was conducted in person.  BP (!) 148/80 (BP Location: Right Arm, Cuff Size: Normal)   Pulse (!) 58   Temp (!) 97.2 F (36.2 C) (Temporal)   Ht 5' 4.5" (1.638 m)   Wt 186 lb 8 oz (84.6 kg)   SpO2 98%   BMI 31.52 kg/m    CC: CPE/AMW Subjective:   HPI: Jeffrey Villarreal is a 82 y.o. male presenting on 10/13/2022 for Annual Exam (MCR prt 2 [AWV- 08/02/22].)   Did not see health advisor.  No results found.  Flowsheet Row Clinical Support from 08/02/2022 in [facility]  PHQ-2 Total Score 0          08/02/2022    2:26 PM 07/30/2021    1:30 PM 07/29/2020    1:14 PM 11/22/2018    9:36 AM 09/14/2017   10:32 AM  Fall Risk   Falls in the past year? 0 0 0 0 No  Number falls in past yr: 0 0 0    Injury with Fall? 0 0 0    Risk for fall due to : No Fall Risks No Fall Risks Medication side effect    Follow up Falls prevention discussed;Falls evaluation completed Falls prevention discussed Falls evaluation completed;Falls prevention discussed     Fasting today.   HTN - continues lisinopril 20mg  daily, amlodipine 10mg  daily, atenolol 25mg  daily. Home BP readings well controlled, doesn't regularly check. Rpt in office adequate  DM - diet controlled. Doesn't check sugars. Upcoming eye doctor appt 11/2022 Dione Booze).  Lab Results  Component Value Date   HGBA1C 6.0 (A) 03/22/2022   Preventative: Colonoscopy 08/2009, large int hemorrhoids, benign lymphoid polyp, rpt due 10 yrs Elnoria Howard). Denies BM changes or blood in stool. iFOB normal 07/2021. Age out.  Prostate - aged out. No previous measures. Nocturia x2  Lung cancer screening - not eligible  Never taken flu shot - declines  COVID vaccine Pfizer 09/2019 x2, 05/2020 booster Tetanus - unsure, thinks ~2009  Pneumovax-23 07/2020, prevnar-20 today Shingrix - discussed, to check  with pharmacy  Advanced directives - has not completed. Would want wife to be HCPOA. Has packet at home. reviewed desires. Would be ok with CPR/compressions, shock, wouldn't want prolonged life support if terminal condition.  Seat belt use discussed  Sunscreen use discussed. No changing moles on skin  Ex smoker - quit 1987  Alcohol - none - quit 1987  Dentist - does not see - has full dentures  Eye exam - sees Groat yearly  Bowel - occasional constipation managed with prn miralax  Bladder - no significant urge incontinence at this time    Caffeine: 1 cup coffee.   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 Interim medical history since our last visit reviewed. Allergies and medications reviewed and updated. Outpatient Medications Prior to Visit  Medication Sig Dispense Refill   aspirin EC 81 MG tablet Take 81 mg by mouth daily.     polyethylene glycol powder (GLYCOLAX/MIRALAX) powder Take 17 g  by mouth daily as needed for moderate constipation. 850 g 1   tiZANidine (ZANAFLEX) 2 MG tablet TAKE 1 TABLET BY MOUTH TWICE DAILY AS NEEDED FOR MUSCLE SPASMS, MAY CAUSE DROWSINESS 30 tablet 0   Turmeric 500 MG CAPS Take 1 capsule by mouth in the morning, at noon, and at bedtime.     amLODipine (NORVASC) 10 MG tablet TAKE 1 TABLET(10 MG) BY MOUTH DAILY 90 tablet 0   atenolol (TENORMIN) 25 MG tablet TAKE 1 TABLET(25 MG) BY MOUTH DAILY 90 tablet 0   Cholecalciferol (VITAMIN D3) 1.25 MG (50000 UT) TABS Take 1 tablet by mouth once a week. 12 tablet 1   lisinopril (ZESTRIL) 20 MG tablet TAKE 1 TABLET(20 MG) BY MOUTH DAILY 90 tablet 0   pravastatin (PRAVACHOL) 80 MG tablet TAKE 1 TABLET BY MOUTH EVERY  DAY. DISCONTINUE LIPITOR 90 tablet 0   No facility-administered medications prior to visit.     Per HPI unless specifically indicated in ROS section below Review of Systems  Constitutional:  Negative for activity change, appetite change, chills, fatigue, fever and unexpected weight change.  HENT:  Negative for hearing loss.   Eyes:  Negative for visual disturbance.  Respiratory:  Positive for cough (pollen related). Negative for chest tightness, shortness of breath and wheezing.   Cardiovascular:  Negative for chest pain, palpitations and leg swelling.  Gastrointestinal:  Negative for abdominal distention, abdominal pain, blood in stool, constipation, diarrhea, nausea and vomiting.  Genitourinary:  Negative for difficulty urinating and hematuria.  Musculoskeletal:  Negative for arthralgias, myalgias and neck pain.  Skin:  Negative for rash.  Neurological:  Negative for dizziness, seizures, syncope and headaches.  Hematological:  Negative for adenopathy. Does not bruise/bleed easily.  Psychiatric/Behavioral:  Negative for dysphoric mood. The patient is not nervous/anxious.     Objective:  BP (!) 148/80 (BP Location: Right Arm, Cuff Size: Normal)   Pulse (!) 58   Temp (!) 97.2 F (36.2 C) (Temporal)   Ht 5' 4.5" (1.638 m)   Wt 186 lb 8 oz (84.6 kg)   SpO2 98%   BMI 31.52 kg/m   Wt Readings from Last 3 Encounters:  10/13/22 186 lb 8 oz (84.6 kg)  08/02/22 192 lb (87.1 kg)  03/22/22 192 lb (87.1 kg)      Physical Exam Vitals and nursing note reviewed.  Constitutional:      General: He is not in acute distress.    Appearance: Normal appearance. He is well-developed. He is not ill-appearing.  HENT:     Head: Normocephalic and atraumatic.     Right Ear: Hearing, tympanic membrane, ear canal and external ear normal.     Left Ear: Hearing, tympanic membrane, ear canal and external ear normal.     Mouth/Throat:     Mouth: Mucous membranes are moist.     Pharynx: Oropharynx is  clear. No oropharyngeal exudate or posterior oropharyngeal erythema.  Eyes:     General: No scleral icterus.    Extraocular Movements: Extraocular movements intact.     Conjunctiva/sclera: Conjunctivae normal.     Pupils: Pupils are equal, round, and reactive to light.  Neck:     Thyroid: No thyroid mass or thyromegaly.     Vascular: Carotid bruit (bilat) present.  Cardiovascular:     Rate and Rhythm: Normal rate and regular rhythm.     Pulses: Normal pulses.          Radial pulses are 2+ on the right side and 2+ on the  left side.     Heart sounds: Murmur (3/6 systolic throughout) heard.  Pulmonary:     Effort: Pulmonary effort is normal. No respiratory distress.     Breath sounds: Normal breath sounds. No wheezing, rhonchi or rales.  Abdominal:     General: Bowel sounds are normal. There is no distension.     Palpations: Abdomen is soft. There is no mass.     Tenderness: There is no abdominal tenderness. There is no guarding or rebound.     Hernia: No hernia is present.  Musculoskeletal:        General: Normal range of motion.     Cervical back: Normal range of motion and neck supple.     Right lower leg: No edema.     Left lower leg: No edema.  Lymphadenopathy:     Cervical: No cervical adenopathy.  Skin:    General: Skin is warm and dry.     Findings: No rash.  Neurological:     General: No focal deficit present.     Mental Status: He is alert and oriented to person, place, and time.  Psychiatric:        Mood and Affect: Mood normal.        Behavior: Behavior normal.        Thought Content: Thought content normal.        Judgment: Judgment normal.       Results for orders placed or performed in visit on 03/22/22  HgB A1c  Result Value Ref Range   Hemoglobin A1C 6.0 (A) 4.0 - 5.6 %   HbA1c POC (<> result, manual entry)     HbA1c, POC (prediabetic range)     HbA1c, POC (controlled diabetic range)     Lab Results  Component Value Date   CREATININE 1.19 07/31/2021    BUN 11 07/31/2021   NA 140 07/31/2021   K 4.1 07/31/2021   CL 103 07/31/2021   CO2 33 (H) 07/31/2021   Assessment & Plan:   Problem List Items Addressed This Visit     Hypertension    Chronic, adequate. Continue current regimen. Mild improvement on recheck BP.       Relevant Medications   amLODipine (NORVASC) 10 MG tablet   atenolol (TENORMIN) 25 MG tablet   lisinopril (ZESTRIL) 20 MG tablet   pravastatin (PRAVACHOL) 80 MG tablet   Type 2 diabetes mellitus with other specified complication (HCC)    Chronic, diet controlled. Continue this.       Relevant Medications   lisinopril (ZESTRIL) 20 MG tablet   pravastatin (PRAVACHOL) 80 MG tablet   Other Relevant Orders   Hemoglobin A1c   Microalbumin / creatinine urine ratio   CBC with Differential/Platelet   Hyperlipidemia associated with type 2 diabetes mellitus (HCC)    Chronic, stable on pravastatin 80mg  daily- update labs. Atorvastatin stopped due to elevated CPK - update today.  The ASCVD Risk score (Arnett DK, et al., 2019) failed to calculate for the following reasons:   The 2019 ASCVD risk score is only valid for ages 59 to 30       Relevant Medications   amLODipine (NORVASC) 10 MG tablet   atenolol (TENORMIN) 25 MG tablet   lisinopril (ZESTRIL) 20 MG tablet   pravastatin (PRAVACHOL) 80 MG tablet   Other Relevant Orders   Lipid panel   Comprehensive metabolic panel   CK   Health maintenance examination - Primary (Chronic)    Preventative protocols reviewed and updated  unless pt declined. Discussed healthy diet and lifestyle.       Advanced care planning/counseling discussion (Chronic)    Previously discussed. Asked to bring [place] copy.       Carotid stenosis    Rpt carotid US.       Relevant Medications   amLODipine (NORVASC) 10 MG tablet   atenolol (TENORMIN) 25 MG tablet   lisinopril (ZESTRIL) 20 MG tablet   pravastatin (PRAVACHOL) 80 MG tablet   Other Relevant Orders   VAS US CAROTID   Vitamin  B12 deficiency    Update levels off replacement.       Relevant Orders   Vitamin B12   Renal insufficiency    Update renal function.       Systolic murmur    Again heard today. Echo from 2016 reassuring. Will continue to monitor.       Vitamin D deficiency    Update levels - has not filled weekly replacement recently      Relevant Orders   VITAMIN D 25 Hydroxy (Vit-D Deficiency, Fractures)   Other Visit Diagnoses     Need for vaccination against Streptococcus pneumoniae       Relevant Orders   Pneumococcal conjugate vaccine 20-valent (Completed)        Meds ordered this encounter  Medications   amLODipine (NORVASC) 10 MG tablet    Sig: Take 1 tablet (10 mg total) by mouth daily. For blood pressure    Dispense:  90 tablet    Refill:  4   atenolol (TENORMIN) 25 MG tablet    Sig: Take 1 tablet (25 mg total) by mouth daily. For blood pressure    Dispense:  90 tablet    Refill:  4   Cholecalciferol (VITAMIN D3) 1.25 MG (50000 UT) TABS    Sig: Take 1 tablet by mouth once a week.    Dispense:  12 tablet    Refill:  3   lisinopril (ZESTRIL) 20 MG tablet    Sig: Take 1 tablet (20 mg total) by mouth daily. For blood pressure    Dispense:  90 tablet    Refill:  4   pravastatin (PRAVACHOL) 80 MG tablet    Sig: Take 1 tablet (80 mg total) by mouth daily. For cholesterol    Dispense:  90 tablet    Refill:  4    Orders Placed This Encounter  Procedures   Pneumococcal conjugate vaccine 20-valent   Hemoglobin A1c   Microalbumin / creatinine urine ratio   Lipid panel   Comprehensive metabolic panel   Vitamin B12   VITAMIN D 25 Hydroxy (Vit-D Deficiency, Fractures)   CBC with Differential/Platelet   CK    Patient Instructions  Prevnar-20 today.  We will order carotid artery neck ultrasound.  We will request latest eye exam from Dr Kem Kays today  If interested, check with pharmacy about new 2 shot shingles series (shingrix).  Continue working on advanced  directive, bring [place] a copy when complete to update your chart.  BP is staying mildly elevated but overall ok - watch salt/sodium in the diet.  Good to see you today Return as needed or in 6 months for follow up visit  Follow up plan: Return in about 6 months (around 04/15/2023) for follow up visit.  Eustaquio Boyden, MD

## 2022-10-13 NOTE — Assessment & Plan Note (Addendum)
Chronic, stable on pravastatin 80mg  daily- update labs. Atorvastatin stopped due to elevated CPK - update today.  The ASCVD Risk score (Arnett DK, et al., 2019) failed to calculate for the following reasons:   The 2019 ASCVD risk score is only valid for ages 29 to 48

## 2022-10-13 NOTE — Assessment & Plan Note (Signed)
Chronic, diet controlled. Continue this.

## 2022-10-13 NOTE — Assessment & Plan Note (Signed)
Update levels - has not filled weekly replacement recently

## 2022-10-13 NOTE — Patient Instructions (Addendum)
Prevnar-20 today.  We will order carotid artery neck ultrasound.  We will request latest eye exam from Dr Kem Kays today  If interested, check with pharmacy about new 2 shot shingles series (shingrix).  Continue working on advanced directive, bring [place] a copy when complete to update your chart.  BP is staying mildly elevated but overall ok - watch salt/sodium in the diet.  Good to see you today Return as needed or in 6 months for follow up visit

## 2022-10-13 NOTE — Assessment & Plan Note (Addendum)
Chronic, adequate. Continue current regimen. Mild improvement on recheck BP.

## 2022-10-13 NOTE — Assessment & Plan Note (Signed)
Update renal function.  

## 2022-10-13 NOTE — Assessment & Plan Note (Signed)
Preventative protocols reviewed and updated unless pt declined. Discussed healthy diet and lifestyle.  

## 2022-10-13 NOTE — Assessment & Plan Note (Deleted)

## 2022-10-13 NOTE — Assessment & Plan Note (Signed)
Rpt carotid US.

## 2022-10-13 NOTE — Assessment & Plan Note (Addendum)
Again heard today. Echo from 2016 reassuring. Will continue to monitor.

## 2022-10-13 NOTE — Assessment & Plan Note (Signed)
Previously discussed. Asked to bring [place] copy.

## 2022-10-13 NOTE — Assessment & Plan Note (Signed)
Update levels off replacement. 

## 2022-10-19 ENCOUNTER — Telehealth: Payer: Self-pay | Admitting: Family Medicine

## 2022-10-19 NOTE — Telephone Encounter (Signed)
Spoke with pt relaying Dr. G's message. Pt verbalizes understanding.  

## 2022-10-19 NOTE — Telephone Encounter (Signed)
I received eye exam from Dr Dione Booze from 11/2021. He didn't have specifically diabetic eye exam. Recommend he request diabetic eye exam at upcoming eye doctor appt in 11/2022.

## 2022-11-01 ENCOUNTER — Ambulatory Visit (HOSPITAL_COMMUNITY)
Admission: RE | Admit: 2022-11-01 | Discharge: 2022-11-01 | Disposition: A | Discharge status: Home / Self Care | Payer: Medicare Other | Source: Ambulatory Visit | Attending: Family Medicine | Admitting: Family Medicine

## 2022-11-01 DIAGNOSIS — I6523 Occlusion and stenosis of bilateral carotid arteries: Secondary | ICD-10-CM | POA: Insufficient documentation

## 2022-11-17 ENCOUNTER — Encounter: Payer: Self-pay | Admitting: Podiatry

## 2022-11-17 ENCOUNTER — Ambulatory Visit: Payer: Medicare Other | Admitting: Podiatry

## 2022-11-17 DIAGNOSIS — E1169 Type 2 diabetes mellitus with other specified complication: Secondary | ICD-10-CM

## 2022-11-17 DIAGNOSIS — B351 Tinea unguium: Secondary | ICD-10-CM | POA: Diagnosis not present

## 2022-11-17 DIAGNOSIS — M79674 Pain in right toe(s): Secondary | ICD-10-CM

## 2022-11-17 DIAGNOSIS — M79675 Pain in left toe(s): Secondary | ICD-10-CM | POA: Diagnosis not present

## 2022-11-17 NOTE — Progress Notes (Signed)
This patient returns to my office for at risk foot care.  This patient requires this care by a professional since this patient will be at risk due to having diabetes  and RI.   Patient denies diabetes. This patient is unable to cut nails himself since the patient cannot reach his nails.These nails are painful walking and wearing shoes.   Patient has not been seen in over 6 months. This patient presents for at risk foot care today.  General Appearance  Alert, conversant and in no acute stress.  Vascular  Dorsalis pedis and posterior tibial  pulses are weakly  palpable  bilaterally.  Capillary return is within normal limits  bilaterally. Temperature is within normal limits  bilaterally.  Neurologic  Senn-Weinstein monofilament wire test within normal limits/diminished   bilaterally. Muscle power within normal limits bilaterally.  Nails Thick disfigured discolored nails with subungual debris  from hallux to fifth toes bilaterally. No evidence of bacterial infection or drainage bilaterally.  Orthopedic  No limitations of motion  feet .  No crepitus or effusions noted.  No bony pathology or digital deformities noted.  Skin  normotropic skin with no porokeratosis noted bilaterally.  No signs of infections or ulcers noted.     Onychomycosis  Pain in right toes  Pain in left toes  Consent was obtained for treatment procedures.   Mechanical debridement of nails 1-5  bilaterally performed with a nail nipper.  Filed with dremel without incident.    Return office visit    3 months                  Told patient to return for periodic foot care and evaluation due to potential at risk complications.   Olivianna Higley DPM   

## 2022-11-18 ENCOUNTER — Ambulatory Visit: Payer: Medicare Other | Admitting: Podiatry

## 2023-02-17 ENCOUNTER — Encounter: Payer: Self-pay | Admitting: Podiatry

## 2023-02-17 ENCOUNTER — Ambulatory Visit: Payer: Medicare Other | Admitting: Podiatry

## 2023-02-17 DIAGNOSIS — B351 Tinea unguium: Secondary | ICD-10-CM | POA: Diagnosis not present

## 2023-02-17 DIAGNOSIS — M79674 Pain in right toe(s): Secondary | ICD-10-CM

## 2023-02-17 DIAGNOSIS — M79675 Pain in left toe(s): Secondary | ICD-10-CM

## 2023-02-17 DIAGNOSIS — E1169 Type 2 diabetes mellitus with other specified complication: Secondary | ICD-10-CM

## 2023-02-17 NOTE — Progress Notes (Signed)
This patient returns to my office for at risk foot care.  This patient requires this care by a professional since this patient will be at risk due to having diabetes  and RI.   Patient denies diabetes. This patient is unable to cut nails himself since the patient cannot reach his nails.These nails are painful walking and wearing shoes.   Patient has not been seen in over 6 months. This patient presents for at risk foot care today.  General Appearance  Alert, conversant and in no acute stress.  Vascular  Dorsalis pedis and posterior tibial  pulses are weakly  palpable  bilaterally.  Capillary return is within normal limits  bilaterally. Temperature is within normal limits  bilaterally.  Neurologic  Senn-Weinstein monofilament wire test within normal limits/diminished   bilaterally. Muscle power within normal limits bilaterally.  Nails Thick disfigured discolored nails with subungual debris  from hallux to fifth toes bilaterally. No evidence of bacterial infection or drainage bilaterally.  Orthopedic  No limitations of motion  feet .  No crepitus or effusions noted.  No bony pathology or digital deformities noted.  Skin  normotropic skin with no porokeratosis noted bilaterally.  No signs of infections or ulcers noted.     Onychomycosis  Pain in right toes  Pain in left toes  Consent was obtained for treatment procedures.   Mechanical debridement of nails 1-5  bilaterally performed with a nail nipper.  Filed with dremel without incident.    Return office visit    3 months                  Told patient to return for periodic foot care and evaluation due to potential at risk complications.   Gregory Mayer DPM   

## 2023-04-06 ENCOUNTER — Other Ambulatory Visit (INDEPENDENT_AMBULATORY_CARE_PROVIDER_SITE_OTHER): Payer: Medicare Other

## 2023-04-06 ENCOUNTER — Ambulatory Visit: Payer: Medicare Other | Admitting: Orthopedic Surgery

## 2023-04-06 DIAGNOSIS — M545 Low back pain, unspecified: Secondary | ICD-10-CM | POA: Diagnosis not present

## 2023-04-06 MED ORDER — PREDNISONE 5 MG (21) PO TBPK: ORAL_TABLET | ORAL | 0 refills | Status: DC

## 2023-04-06 NOTE — Progress Notes (Signed)
Office Visit Note   Patient: Jeffrey Villarreal           Date of Birth: [birth year]           MRN: [id number] Visit Date: 04/06/2023 Requested by: Eustaquio Boyden, MD [address] [place],  [place] [address] PCP: Eustaquio Boyden, MD  Subjective: Chief Complaint  Patient presents with   Lower Back - Pain    HPI: ELLIOT TWITE is a 82 y.o. male who presents to the office reporting low back pain for 2 months duration.  Denies any history of injury.  Reports right leg cramping and numbness.  Also some low back stiffness.  Difficulty standing up straight.  No weight from sleep.  Denies any history of injury.  At times he reports increased pain after prolonged walking or standing..                ROS: All systems reviewed are negative as they relate to the chief complaint within the history of present illness.  Patient denies fevers or chills.  Assessment & Plan: Visit Diagnoses:  1. Low back pain, unspecified back pain laterality, unspecified chronicity, unspecified whether sciatica present     Plan: Impression is low back pain of 2 months duration.  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  Hips look intact radiographically.  Medrol Dosepak 6-day course with physical therapy here for low back pain in 6-week return with decision for or against MRI scanning at that time  Follow-Up Instructions: Return in about 6 weeks (around 05/18/2023).   Orders:  Orders Placed This Encounter  Procedures   XR Lumbar Spine 2-3 Views   Ambulatory referral to Physical Therapy   Meds ordered this encounter  Medications   predniSONE (STERAPRED UNI-PAK 21 TAB) 5 MG (21) TBPK tablet    Sig: Take dosepak as directed    Dispense:  21 tablet    Refill:  0      Procedures: No procedures performed   Clinical Data: No additional findings.  Objective: Vital Signs: There were no vitals taken for this visit.  Physical Exam:  Constitutional: Patient  appears well-developed HEENT:  Head: Normocephalic Eyes:EOM are normal Neck: Normal range of motion Cardiovascular: Normal rate Pulmonary/chest: Effort normal Neurologic: Patient is alert Skin: Skin is warm Psychiatric: Patient has normal mood and affect  Ortho Exam: Ortho exam demonstrates mildly positive nerve root tension signs on the right negative on the left.  5 out of 5 ankle dorsiflexion plantarflexion quad hamstring strength.  Palpable pedal pulses.  Intact ankle dorsiflexion plantarflexion strength is present.  Hip flexion strength 5+ out of 5.  No definite paresthesias L1 S1 bilaterally.  Does have some pain with forward lateral bending as well as with hyperextension.  Specialty Comments:  No specialty comments available.  Imaging: No results found.   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   Carotid stenosis 08/13/2014  Health maintenance examination 02/15/2014   Advanced care planning/counseling discussion 02/15/2014   Constipation 12/13/2013   Medicare annual wellness visit, subsequent 09/25/2011   Hyperlipidemia associated with type 2 diabetes mellitus (HCC) 09/25/2011   Type 2 diabetes mellitus with other specified complication (HCC)    Clubbing of nails 11/17/2010   Hypertension    Past Medical History:  Diagnosis Date   Carotid stenosis 08/2014   1-39% bilateral, rpt [place] 2 yrs   Diet-controlled type 2 diabetes mellitus (HCC)     DSME 02/2013   History of seizures as a child remote   Hyperlipidemia    Hypertension     Family History  Problem Relation Age of Onset   Hypertension Mother    Hypertension Father    Cancer Sister        lung   Cancer Sister        lung   Coronary artery disease Neg Hx    Stroke Neg Hx    Diabetes Neg Hx    Kidney disease Neg Hx    Hyperlipidemia Neg Hx     Past Surgical History:  Procedure Laterality Date   CATARACT EXTRACTION Right 2013   COLONOSCOPY  08/2009   large int hemorrhoids, benign lymphoid polyp, rpt due 10 yrs ([place])   DOBUTAMINE STRESS ECHO  2016   WNL Eldridge Dace)   Social History   Occupational History   Occupation: retired    Associate Professor: Retired    Comment: used to work at [employer], [employer]  Tobacco Use   Smoking status: Former    Current packs/day: 0.00    Types: Cigarettes    Quit date: 06/14/1985    Years since quitting: 37.8    Passive exposure: Past   Smokeless tobacco: Never  Vaping Use   Vaping status: Never Used  Substance and Sexual Activity   Alcohol use: No    Comment: Quit 1987   Drug use: No   Sexual activity: Not Currently

## 2023-04-09 ENCOUNTER — Encounter: Payer: Self-pay | Admitting: Orthopedic Surgery

## 2023-04-14 ENCOUNTER — Ambulatory Visit (INDEPENDENT_AMBULATORY_CARE_PROVIDER_SITE_OTHER): Payer: Medicare Other | Admitting: Physical Therapy

## 2023-04-14 ENCOUNTER — Telehealth: Payer: Self-pay | Admitting: Orthopedic Surgery

## 2023-04-14 ENCOUNTER — Encounter: Payer: Self-pay | Admitting: Physical Therapy

## 2023-04-14 ENCOUNTER — Other Ambulatory Visit: Payer: Self-pay

## 2023-04-14 DIAGNOSIS — M6281 Muscle weakness (generalized): Secondary | ICD-10-CM

## 2023-04-14 DIAGNOSIS — M5459 Other low back pain: Secondary | ICD-10-CM | POA: Diagnosis not present

## 2023-04-14 NOTE — Telephone Encounter (Signed)
Dont want to do prednisone course again back to back.  If still having significant pain, think next step would be MRI L spine

## 2023-04-14 NOTE — Therapy (Signed)
OUTPATIENT PHYSICAL THERAPY THORACOLUMBAR EVALUATION   Patient Name: Jeffrey Villarreal, 82 y.o., male Today's Date: 04/14/2023  END OF SESSION:  PT End of Session - 04/14/23 1457     Visit Number 1    Number of Visits 10    Date for PT Re-Evaluation 05/12/23    Authorization Type UHC MCR    PT Start Time 1430    PT Stop Time 1505    PT Time Calculation (min) 35 min    Activity Tolerance Patient tolerated treatment well    Behavior During Therapy [facility] for tasks assessed/performed             Past Medical History:  Diagnosis Date   Carotid stenosis 08/2014   1-39% bilateral, rpt [place] 2 yrs   Diet-controlled type 2 diabetes mellitus (HCC)    DSME 02/2013   History of seizures as a child remote   Hyperlipidemia    Hypertension    Past Surgical History:  Procedure Laterality Date   CATARACT EXTRACTION Right 2013   COLONOSCOPY  08/2009   large int hemorrhoids, benign lymphoid polyp, rpt due 10 yrs ([place])   DOBUTAMINE STRESS ECHO  2016   WNL Eldridge Dace)   Patient Active Problem List   Diagnosis Date Noted   Low back pain 03/22/2022   Pain due to onychomycosis of toenails of both feet 12/04/2021   Vitamin D deficiency 07/23/2021   Hip pain, bilateral 02/04/2021   Systolic murmur 02/04/2021   Renal insufficiency 08/06/2020   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   Vitamin B12 deficiency 03/19/2019   Paresthesia of right foot 03/14/2019   Anisocoria 09/02/2017   Chest discomfort 08/23/2017   Memory deficit 02/25/2015   Cerumen impaction 02/25/2015   Syncope 08/19/2014   Obesity, Class I, BMI 30-34.9 08/16/2014   Carotid stenosis 08/13/2014   Health maintenance examination 02/15/2014   Advanced care planning/counseling discussion 02/15/2014   Constipation 12/13/2013   Medicare annual wellness visit, subsequent 09/25/2011   Hyperlipidemia associated with type 2 diabetes mellitus (HCC) 09/25/2011   Type 2 diabetes mellitus with other specified  complication (HCC)    Clubbing of nails 11/17/2010   Hypertension     PCP: Eustaquio Boyden, MD   REFERRING PROVIDER: Cammy Copa, MD   REFERRING DIAG: M54.50 (ICD-10-CM) - Low back pain, unspecified back pain laterality, unspecified chronicity, unspecified whether sciatica present  Rationale for Evaluation and Treatment: Rehabilitation  THERAPY DIAG:  Other low back pain  ONSET DATE: 4 weeks onset of pain  SUBJECTIVE:  SUBJECTIVE STATEMENT: Jeffrey Villarreal is a 82 y.o. male who presents to the office reporting low back pain for 4 weeks  Denies any history of injury.  Reports right leg numbness sometime but is not regular.  Also some low back stiffness.  Difficulty standing up from chair and trying to stand up straight. Once he gets moving then the pain eases off and he can do everything he needs independently.  PERTINENT HISTORY: DM, lumbar facet arthropathy and DDD   PAIN:  NPRS scale: 5/10 upon arrival Pain location:center or low back out to his hips Pain description: tight sharp pain that comes and goes Aggravating factors: standing up from chair and trying to stand Relieving factors: leaning on wall with back against wall, meds  PRECAUTIONS: None  RED FLAGS: None   WEIGHT BEARING RESTRICTIONS: No  FALLS:  Has patient fallen in last 6 months? No  OCCUPATION: retired  PLOF: Independent with basic ADLs  PATIENT GOALS: reduce pain  NEXT MD VISIT: 05/18/23  OBJECTIVE:  Note: Objective measures were completed at Evaluation unless otherwise noted.  DIAGNOSTIC FINDINGS:    AP lateral radiographs lumbar spine reviewed.  Significant facet arthritis  is present at L4-5 and L5-S1.  Significant degenerative disc disease also  present seen on the lateral view at L5-S1.   Moderate anterior spurring is  present.  Moderate degenerative disc disease without bone-on-bone changes  noted from L1-L4.  No spondylolisthesis or compression fractures.   PATIENT SURVEYS:  FOTO 44% functional goal is 57%  SCREENING FOR RED FLAGS: Bowel or bladder incontinence: No Spinal tumors: No Cauda equina syndrome: No Compression fracture: No  COGNITION: Overall cognitive status: Within functional limits for tasks assessed     SENSATION: WFL  MUSCLE LENGTH: Hamstrings: moderate tightness bilat   LUMBAR ROM:   AROM eval  Flexion WFL  Extension 50%  Right lateral flexion WFL  Left lateral flexion WFL  Right rotation WFL  Left rotation WFL   (Blank rows = not tested)   LOWER EXTREMITY MMT:    MMT Right eval Left eval  Hip flexion 5 5  Hip extension 4 5  Hip abduction 4 5  Hip adduction    Hip internal rotation    Hip external rotation    Knee flexion 4 5  Knee extension 4 5  Ankle dorsiflexion 5 5  Ankle plantarflexion 5 5  Ankle inversion    Ankle eversion     (Blank rows = not tested)  LUMBAR SPECIAL TESTS:  Straight leg raise test: Negative and Slump test: Negative + Gowers sign coming back into extension from flexion  GAIT: Comments: WFL after he gets into upright posture as has difficulty with this upon standing  TODAY'S TREATMENT:  Eval HEP creation and review with demonstration and trial set preformed, see below for details. Moist heat X 10 min to lumbar with HEP creation and review    PATIENT EDUCATION: Education details: HEP, PT plan of care Person educated: Patient Education method: Explanation, Demonstration, Verbal cues, and Handouts Education comprehension: verbalized understanding and needs further education   HOME EXERCISE PROGRAM: Access Code: [email or web address] URL: [email or web address] Date: 04/14/2023 Prepared by: Ivery Quale  Exercises - Seated Lumbar Flexion Stretch  - 2 x daily - 6 x weekly - 1 sets - 10  reps - 10 hold - Standing Lumbar Extension at Wall - Forearms  - 2 x daily - 6 x weekly - 1 sets - 10 reps - Supine Lower Trunk Rotation  - 2  x daily - 6 x weekly - 1 sets - 10 reps - 5 sec hold - Hooklying Single Knee to Chest Stretch  - 2 x daily - 6 x weekly - 1 sets - 2 reps - 30 hold - Supine Bridge  - 2 x daily - 6 x weekly - 1-2 sets - 10 reps - 5 hold  ASSESSMENT:  CLINICAL IMPRESSION: Patient referred to PT for low back pain with facet arthropathy and DDD. Patient will benefit from skilled PT to address below impairments, limitations and improve overall function. He plans to trial HEP over next week and will follow up with PT if he needs to return.   OBJECTIVE IMPAIRMENTS: decreased activity tolerance, difficulty walking, decreased mobility, decreased ROM, decreased strength, impaired flexibility, and pain.  ACTIVITY LIMITATIONS: bending, lifting, carry, locomotion, cleaning, community activity PERSONAL FACTORS: see above PMH are also affecting patient's functional outcome.  REHAB POTENTIAL: Good  CLINICAL DECISION MAKING: Stable/uncomplicated  EVALUATION COMPLEXITY: Low    GOALS: Long term PT Goals Target date: 05/12/2023   Pt will be I and compliant with HEP. Baseline:  Goal status: New 2. Pt will improve lumbar extension ROM to [facility] 75% or more  to improve functional mobility Baseline: Goal status: New  3. Pt will improve  Rt hip/knee strength to 5/5 MMT in sitting to improve functional strength Baseline: Goal status: New     4. Pt will improve FOTO to at least 57% functional to show improved function Baseline: Goal status: New 5. Pt will reduce pain to overall less than 2-3/10 with usual activity, walking and sit to stands Baseline: Goal status: New  PLAN: PT FREQUENCY: 1-3 times per week   PT DURATION: 4 weeks  PLANNED INTERVENTIONS (unless contraindicated): aquatic PT, Canalith repositioning, cryotherapy, Electrical stimulation, Iontophoresis with 4  mg/ml dexamethasome, Moist heat, traction, Ultrasound, gait training, Therapeutic exercise, balance training, neuromuscular re-education, patient/family education, prosthetic training, manual techniques, passive ROM, dry needling, taping, vasopnuematic device, vestibular, spinal manipulations, joint manipulations 97110-Therapeutic exercises, 97530- Therapeutic activity, [id number]- Neuromuscular re-education, 97535- Self Care, and [id number]- Manual therapy  PLAN FOR NEXT SESSION: review and update HEP PRN   Date of referral: 04/06/23 Referring provider: Cammy Copa, MD  Referring diagnosis? M54.50 (ICD-10-CM) - Low back pain, unspecified back pain laterality, unspecified chronicity, unspecified whether sciatica present Treatment diagnosis? (if different than referring diagnosis) m54.59  What was this (referring dx) caused by? Arthritis  Nature of Condition: Initial Onset (within last 3 months)   Laterality: Both but Right side more affected  Current Functional Measure Score: FOTO 44% functional goal is 57%  Objective measurements identify impairments when they are compared to normal values, the uninvolved extremity, and prior level of function.  [x]  Yes  []  No  Objective assessment of functional ability: Moderate functional limitations   Briefly describe symptoms: back pain and stiffness with arthritis that limits his standing and walking  How did symptoms start: gradualy came on 4 weeks ago  Average pain intensity:  Last 24 hours: 2/10  Past week: 6/10  How often does the pt experience symptoms? Occasionally  How much have the symptoms interfered with usual daily activities? Moderately  How has condition changed since care began at this facility? NA - initial visit  In general, how is the patients overall health? Good   BACK PAIN (STarT Back Screening Tool) No, used FOTO instead, see above for details  April Manson, PT,DPT 04/14/2023, 2:59 PM

## 2023-04-14 NOTE — Telephone Encounter (Signed)
Pt requesting refill Prednisone refill please advise

## 2023-04-15 ENCOUNTER — Ambulatory Visit: Payer: Medicare Other | Admitting: Family Medicine

## 2023-04-15 NOTE — Telephone Encounter (Signed)
Tried calling patient to discuss. No answer. Will try to reach patient again at a later time.

## 2023-04-19 ENCOUNTER — Ambulatory Visit (INDEPENDENT_AMBULATORY_CARE_PROVIDER_SITE_OTHER): Payer: Medicare Other | Admitting: Family Medicine

## 2023-04-19 ENCOUNTER — Encounter: Payer: Self-pay | Admitting: Family Medicine

## 2023-04-19 ENCOUNTER — Other Ambulatory Visit: Payer: Self-pay | Admitting: Orthopedic Surgery

## 2023-04-19 VITALS — BP 134/68 | HR 57 | Temp 98.0°F | Ht 64.5 in | Wt 181.0 lb

## 2023-04-19 DIAGNOSIS — Z23 Encounter for immunization: Secondary | ICD-10-CM

## 2023-04-19 DIAGNOSIS — E1169 Type 2 diabetes mellitus with other specified complication: Secondary | ICD-10-CM | POA: Diagnosis not present

## 2023-04-19 DIAGNOSIS — M545 Low back pain, unspecified: Secondary | ICD-10-CM

## 2023-04-19 DIAGNOSIS — E66811 Obesity, class 1: Secondary | ICD-10-CM | POA: Diagnosis not present

## 2023-04-19 DIAGNOSIS — R011 Cardiac murmur, unspecified: Secondary | ICD-10-CM | POA: Diagnosis not present

## 2023-04-19 DIAGNOSIS — I10 Essential (primary) hypertension: Secondary | ICD-10-CM

## 2023-04-19 DIAGNOSIS — L602 Onychogryphosis: Secondary | ICD-10-CM | POA: Diagnosis not present

## 2023-04-19 DIAGNOSIS — E559 Vitamin D deficiency, unspecified: Secondary | ICD-10-CM

## 2023-04-19 LAB — POCT GLYCOSYLATED HEMOGLOBIN (HGB A1C): Hemoglobin A1C: 5.9 % — AB (ref 4.0–5.6)

## 2023-04-19 NOTE — Assessment & Plan Note (Signed)
He needs to refill vitamin D prescription available at pharmacy.

## 2023-04-19 NOTE — Progress Notes (Signed)
Ph: [phone] Fax: [phone]   Patient ID: Jeffrey Villarreal, male    DOB: [birth year], 82 y.o.   MRN: [id number]  This visit was conducted in person.  BP 134/68   Pulse (!) 57   Temp 98 F (36.7 C) (Oral)   Ht 5' 4.5" (1.638 m)   Wt 181 lb (82.1 kg)   SpO2 97%   BMI 30.59 kg/m    CC: 6 mo f/u visit  Subjective:   HPI: Jeffrey Villarreal is a 82 y.o. male presenting on 04/19/2023 for Medical Management of Chronic Issues (Here for 6 mo DM/HTN f/u.)   10 lb weight loss in the past 9 months. Appetite ok. No blood in stool or urine.   Recently saw orthopedist Dr. August Saucer with lower back pain, treated with prednisone taper and physical therapy referral with plan to touch base in 6 weeks to discuss possible MRI if ongoing pain. He does have HEP. He finds home exercises are helping. X-rays showed significant facet arthritis at L4-5 and L5-S1   HTN - Compliant with current antihypertensive regimen of amlodipine 10mg , atenolol 25mg  daily, lisinopril 20mg  daily. Does not check blood pressures at home. No low blood pressure readings or symptoms of dizziness/syncope. Denies HA, vision changes, CP/tightness, SOB, leg swelling.   DM - does not regularly check sugars. Compliant with antihyperglycemic regimen which includes: diet control. Denies low sugars or hypoglycemic symptoms. Denies paresthesias, blurry vision. Last diabetic eye exam DUE - will call for appt with [facility]. Glucometer brand: doesn't have. Last foot exam: 05/2022. DSME: 2014. Lab Results  Component Value Date   HGBA1C 5.9 (A) 04/19/2023   Diabetic Foot Exam - Simple   Simple Foot Form Diabetic Foot exam was performed with the following findings: Yes 04/19/2023 11:17 AM  Visual Inspection See comments: Yes Sensation Testing Intact to touch and monofilament testing bilaterally: Yes Pulse Check Posterior Tibialis and Dorsalis pulse intact bilaterally: Yes Comments No claudication Thickened nails bilaterally Right medial  great toe with thickening of skin presumed corn vs wart    Lab Results  Component Value Date   MICROALBUR 2.4 (H) 10/13/2022       Relevant past medical, surgical, family and social history reviewed and updated as indicated. Interim medical history since our last visit reviewed. Allergies and medications reviewed and updated. Outpatient Medications Prior to Visit  Medication Sig Dispense Refill   amLODipine (NORVASC) 10 MG tablet Take 1 tablet (10 mg total) by mouth daily. For blood pressure 90 tablet 4   aspirin EC 81 MG tablet Take 81 mg by mouth daily.     atenolol (TENORMIN) 25 MG tablet Take 1 tablet (25 mg total) by mouth daily. For blood pressure 90 tablet 4   Cholecalciferol (VITAMIN D3) 1.25 MG (50000 UT) TABS Take 1 tablet by mouth once a week. 12 tablet 3   lisinopril (ZESTRIL) 20 MG tablet Take 1 tablet (20 mg total) by mouth daily. For blood pressure 90 tablet 4   polyethylene glycol powder (GLYCOLAX/MIRALAX) powder Take 17 g by mouth daily as needed for moderate constipation. 850 g 1   pravastatin (PRAVACHOL) 80 MG tablet Take 1 tablet (80 mg total) by mouth daily. For cholesterol 90 tablet 4   tiZANidine (ZANAFLEX) 2 MG tablet TAKE 1 TABLET BY MOUTH TWICE DAILY AS NEEDED FOR MUSCLE SPASMS, MAY CAUSE DROWSINESS 30 tablet 0   predniSONE (STERAPRED UNI-PAK 21 TAB) 5 MG (21) TBPK tablet Take dosepak as directed 21 tablet 0  Turmeric 500 MG CAPS Take 1 capsule by mouth in the morning, at noon, and at bedtime.     No facility-administered medications prior to visit.     Per HPI unless specifically indicated in ROS section below Review of Systems  Objective:  BP 134/68   Pulse (!) 57   Temp 98 F (36.7 C) (Oral)   Ht 5' 4.5" (1.638 m)   Wt 181 lb (82.1 kg)   SpO2 97%   BMI 30.59 kg/m   Wt Readings from Last 3 Encounters:  04/19/23 181 lb (82.1 kg)  10/13/22 186 lb 8 oz (84.6 kg)  08/02/22 192 lb (87.1 kg)      Physical Exam Vitals and nursing note reviewed.   Constitutional:      Appearance: Normal appearance. He is not ill-appearing.  HENT:     Head: Normocephalic and atraumatic.  Eyes:     Extraocular Movements: Extraocular movements intact.     Conjunctiva/sclera: Conjunctivae normal.     Pupils: Pupils are equal, round, and reactive to light.  Cardiovascular:     Rate and Rhythm: Normal rate and regular rhythm.     Pulses: Normal pulses.     Heart sounds: Murmur (3/6 systolic best at LUSB) heard.  Pulmonary:     Effort: Pulmonary effort is normal. No respiratory distress.     Breath sounds: Normal breath sounds. No wheezing, rhonchi or rales.  Musculoskeletal:     Right lower leg: No edema.     Left lower leg: No edema.     Comments: See HPI for foot exam if done  Skin:    General: Skin is warm and dry.     Findings: No rash.  Neurological:     Mental Status: He is alert.  Psychiatric:        Mood and Affect: Mood normal.        Behavior: Behavior normal.       Results for orders placed or performed in visit on 04/19/23  POCT glycosylated hemoglobin (Hb A1C)  Result Value Ref Range   Hemoglobin A1C 5.9 (A) 4.0 - 5.6 %   HbA1c POC (<> result, manual entry)     HbA1c, POC (prediabetic range)     HbA1c, POC (controlled diabetic range)      Lab Results  Component Value Date   VD25OH 16.41 (L) 10/13/2022    Assessment & Plan:   Problem List Items Addressed This Visit     Hypertension    Chronic, stable on current regimen - continue.       Type 2 diabetes mellitus with other specified complication (HCC) - Primary    Chronic, diet controlled, now trending into prediabetes range with noted weight loss even after recent prednisone course.   Encouraged continuing to follow low sugar low-carb diabetic diet.      Relevant Orders   POCT glycosylated hemoglobin (Hb A1C) (Completed)   Obesity, Class I, BMI 30-34.9    Discussed noted 10 pound weight loss since February of this year. Patient will continue to monitor  weights at home, let me know if continued unintentional weight loss for further evaluation.      Onychogryphosis    Regularly seeing podiatry.      Systolic murmur    Possibly progressive. Denies cardiac symptoms. Reviewed echo from 2016 which was reassuring-consider updating this.      Vitamin D deficiency    He needs to refill vitamin D prescription available at pharmacy.  Low back pain    Seeing orthopedist, status post recent prednisone taper with benefit, also started on physical therapy course with benefit.      Other Visit Diagnoses     Encounter for immunization       Relevant Orders   Flu Vaccine Trivalent High Dose (Fluad) (Completed)        No orders of the defined types were placed in this encounter.   Orders Placed This Encounter  Procedures   Flu Vaccine Trivalent High Dose (Fluad)   POCT glycosylated hemoglobin (Hb A1C)    Patient Instructions  Flu shot today  Ask for diabetic eye exam with Dr Dione Booze for next visit.  Continue to monitor weight at home, let us know if ongoing weight loss noted.  Good to see you today. Return as needed or in 6 months for physical/wellness visit   Follow up plan: Return in about 6 months (around 10/17/2023), or if symptoms worsen or fail to improve, for annual exam, prior fasting for blood work, medicare wellness visit.  Eustaquio Boyden, MD

## 2023-04-19 NOTE — Assessment & Plan Note (Signed)
Regularly seeing podiatry.

## 2023-04-19 NOTE — Assessment & Plan Note (Signed)
Seeing orthopedist, status post recent prednisone taper with benefit, also started on physical therapy course with benefit.

## 2023-04-19 NOTE — Patient Instructions (Addendum)
Flu shot today  Ask for diabetic eye exam with Dr Dione Booze for next visit.  Continue to monitor weight at home, let us know if ongoing weight loss noted.  Good to see you today. Return as needed or in 6 months for physical/wellness visit

## 2023-04-19 NOTE — Assessment & Plan Note (Signed)
Chronic, stable on current regimen - continue. 

## 2023-04-19 NOTE — Assessment & Plan Note (Signed)
Discussed noted 10 pound weight loss since February of this year. Patient will continue to monitor weights at home, let me know if continued unintentional weight loss for further evaluation.

## 2023-04-19 NOTE — Assessment & Plan Note (Signed)
Possibly progressive. Denies cardiac symptoms. Reviewed echo from 2016 which was reassuring-consider updating this.

## 2023-04-19 NOTE — Assessment & Plan Note (Addendum)
Chronic, diet controlled, now trending into prediabetes range with noted weight loss even after recent prednisone course.   Encouraged continuing to follow low sugar low-carb diabetic diet.

## 2023-05-11 ENCOUNTER — Telehealth: Payer: Self-pay | Admitting: Orthopedic Surgery

## 2023-05-18 ENCOUNTER — Encounter: Payer: Self-pay | Admitting: Orthopedic Surgery

## 2023-05-18 ENCOUNTER — Other Ambulatory Visit (INDEPENDENT_AMBULATORY_CARE_PROVIDER_SITE_OTHER): Payer: Medicare Other

## 2023-05-18 ENCOUNTER — Ambulatory Visit: Payer: Medicare Other | Admitting: Surgical

## 2023-05-18 DIAGNOSIS — M25511 Pain in right shoulder: Secondary | ICD-10-CM

## 2023-05-18 NOTE — Progress Notes (Signed)
Office Visit Note   Patient: Jeffrey Villarreal           Date of Birth: July 12, 1940           MRN: [id number] Visit Date: 05/18/2023 Requested by: Eustaquio Boyden, MD [address] [place],  [place] [address] PCP: Eustaquio Boyden, MD  Subjective: Chief Complaint  Patient presents with   Lower Back - Follow-up    HPI: Jeffrey Villarreal is a 82 y.o. male who presents to the office for follow-up of low back pain.  States that he tried Dosepak with good relief and has been doing physical therapy and home exercise program with about 75% relief of his low back symptoms.  Currently he is complaining primarily of right shoulder pain that has been ongoing for about 2 months.  No history of prior injury.  Does have a little bit of left shoulder pain as well.  Pain will not wake him up from sleep at night and he denies any new mechanical symptoms.  States that symptoms are worse with lifting his arm directly in front and out to the side in particular.  No history of prior surgery to the shoulder.  He describes anterolateral shoulder pain without radiation.  He enjoys walking and fishing.  Shoulder pain has not really gotten in the way of his fishing since it is too cold for [place] anyway..                ROS: All systems reviewed are negative as they relate to the chief complaint within the history of present illness.  Patient denies fevers or chills.  Assessment & Plan: Visit Diagnoses:  1. Acute pain of right shoulder     Plan: Patient is an 82 year old male who presents for evaluation of right shoulder pain.  Also is here to follow-up for his low back though this is about 75% improved since the last visit.  He does have some early degenerative changes of the right shoulder as noted on radiographs today.  After discussion of options, he would like to try physical therapy upstairs starting the first week of January primarily to focus on shoulder range of motion and rotator cuff strengthening  exercises.  We will see him back in February and if no improvement from PT/home exercise program then we could consider glenohumeral injection at that time.  Follow-Up Instructions: No follow-ups on file.   Orders:  Orders Placed This Encounter  Procedures   XR Shoulder Right   No orders of the defined types were placed in this encounter.     Procedures: No procedures performed   Clinical Data: No additional findings.  Objective: Vital Signs: There were no vitals taken for this visit.  Physical Exam:  Constitutional: Patient appears well-developed HEENT:  Head: Normocephalic Eyes:EOM are normal Neck: Normal range of motion Cardiovascular: Normal rate Pulmonary/chest: Effort normal Neurologic: Patient is alert Skin: Skin is warm Psychiatric: Patient has normal mood and affect  Ortho Exam: Ortho exam demonstrates right shoulder with 45 degrees X rotation, 75 degrees abduction, 135 degrees forward elevation passively and actively.  Excellent rotator cuff strength of supra, infra, subscap.  Does have moderate tenderness over the bicipital groove.  Increased pain with range of motion of the shoulder at terminal range of motion.  No cellulitis or skin changes noted.  No tenderness over the [facility] joint.  There is a little bit of coarseness with passive motion of the shoulder.  Negative external rotation lag sign.  Negative Hornblower sign.  Intact EPL, FPL, finger abduction.  2+ radial pulse of the right upper extremity.  Specialty Comments:  No specialty comments available.  Imaging: No results found.   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   Vitamin B12 deficiency 03/19/2019   Paresthesia of right  foot 03/14/2019   Anisocoria 09/02/2017   Chest discomfort 08/23/2017   Memory deficit 02/25/2015   Cerumen impaction 02/25/2015   Syncope 08/19/2014   Obesity, Class I, BMI 30-34.9 08/16/2014   Carotid stenosis 08/13/2014   Health maintenance examination 02/15/2014   Advanced care planning/counseling discussion 02/15/2014   Constipation 12/13/2013   Medicare annual wellness visit, subsequent 09/25/2011   Hyperlipidemia associated with type 2 diabetes mellitus (HCC) 09/25/2011   Type 2 diabetes mellitus with other specified complication (HCC)    Clubbing of nails 11/17/2010   Hypertension    Past Medical History:  Diagnosis Date   Carotid stenosis 08/2014   1-39% bilateral, rpt [place] 2 yrs   Diet-controlled type 2 diabetes mellitus (HCC)    DSME 02/2013   History of seizures as a child remote   Hyperlipidemia    Hypertension     Family History  Problem Relation Age of Onset   Hypertension Mother    Hypertension Father    Cancer Sister        lung   Cancer Sister        lung   Coronary artery disease Neg Hx    Stroke Neg Hx    Diabetes Neg Hx    Kidney disease Neg Hx    Hyperlipidemia Neg Hx     Past Surgical History:  Procedure Laterality Date   CATARACT EXTRACTION Right 2013   COLONOSCOPY  08/2009   large int hemorrhoids, benign lymphoid polyp, rpt due 10 yrs ([place])   DOBUTAMINE STRESS ECHO  2016   WNL Eldridge Dace)   Social History   Occupational History   Occupation: retired    Associate Professor: Retired    Comment: used to work at [employer], [employer]  Tobacco Use   Smoking status: Former    Current packs/day: 0.00    Types: Cigarettes    Quit date: 06/14/1985    Years since quitting: 37.9    Passive exposure: Past   Smokeless tobacco: Never  Vaping Use   Vaping status: Never Used  Substance and Sexual Activity   Alcohol use: No    Comment: Quit 1987   Drug use: No   Sexual activity: Not Currently

## 2023-05-19 ENCOUNTER — Encounter: Payer: Self-pay | Admitting: Podiatry

## 2023-05-19 ENCOUNTER — Ambulatory Visit (INDEPENDENT_AMBULATORY_CARE_PROVIDER_SITE_OTHER): Payer: Medicare Other | Admitting: Podiatry

## 2023-05-19 DIAGNOSIS — B351 Tinea unguium: Secondary | ICD-10-CM

## 2023-05-19 DIAGNOSIS — M79674 Pain in right toe(s): Secondary | ICD-10-CM | POA: Diagnosis not present

## 2023-05-19 DIAGNOSIS — M79675 Pain in left toe(s): Secondary | ICD-10-CM

## 2023-05-19 DIAGNOSIS — E1169 Type 2 diabetes mellitus with other specified complication: Secondary | ICD-10-CM

## 2023-05-19 NOTE — Progress Notes (Signed)
This patient returns to my office for at risk foot care.  This patient requires this care by a professional since this patient will be at risk due to having diabetes  and RI.   Patient denies diabetes. This patient is unable to cut nails himself since the patient cannot reach his nails.These nails are painful walking and wearing shoes.   Patient has not been seen in over 6 months. This patient presents for at risk foot care today.  General Appearance  Alert, conversant and in no acute stress.  Vascular  Dorsalis pedis and posterior tibial  pulses are weakly  palpable  bilaterally.  Capillary return is within normal limits  bilaterally. Temperature is within normal limits  bilaterally.  Neurologic  Senn-Weinstein monofilament wire test within normal limits/diminished   bilaterally. Muscle power within normal limits bilaterally.  Nails Thick disfigured discolored nails with subungual debris  from hallux to fifth toes bilaterally. No evidence of bacterial infection or drainage bilaterally.  Orthopedic  No limitations of motion  feet .  No crepitus or effusions noted.  No bony pathology or digital deformities noted.  Skin  normotropic skin with no porokeratosis noted bilaterally.  No signs of infections or ulcers noted.     Onychomycosis  Pain in right toes  Pain in left toes  Consent was obtained for treatment procedures.   Mechanical debridement of nails 1-5  bilaterally performed with a nail nipper.  Filed with dremel without incident.    Return office visit    3 months                  Told patient to return for periodic foot care and evaluation due to potential at risk complications.   Olivianna Higley DPM   

## 2023-05-22 ENCOUNTER — Encounter: Payer: Self-pay | Admitting: Orthopedic Surgery

## 2023-06-17 ENCOUNTER — Ambulatory Visit: Payer: Medicare Other | Admitting: Rehabilitative and Restorative Service Providers"

## 2023-06-23 ENCOUNTER — Ambulatory Visit: Payer: Medicare Other | Admitting: Rehabilitative and Restorative Service Providers"

## 2023-07-18 ENCOUNTER — Ambulatory Visit: Payer: Medicare Other | Admitting: Orthopedic Surgery

## 2023-07-20 ENCOUNTER — Ambulatory Visit: Payer: Medicare Other | Admitting: Orthopedic Surgery

## 2023-07-22 ENCOUNTER — Ambulatory Visit: Payer: Medicare Other | Admitting: Family Medicine

## 2023-07-22 ENCOUNTER — Encounter: Payer: Self-pay | Admitting: Family Medicine

## 2023-07-22 VITALS — BP 134/82 | HR 59 | Temp 97.7°F | Ht 64.5 in | Wt 187.4 lb

## 2023-07-22 DIAGNOSIS — R52 Pain, unspecified: Secondary | ICD-10-CM | POA: Diagnosis not present

## 2023-07-22 DIAGNOSIS — E538 Deficiency of other specified B group vitamins: Secondary | ICD-10-CM | POA: Diagnosis not present

## 2023-07-22 DIAGNOSIS — E1169 Type 2 diabetes mellitus with other specified complication: Secondary | ICD-10-CM

## 2023-07-22 DIAGNOSIS — M791 Myalgia, unspecified site: Secondary | ICD-10-CM

## 2023-07-22 DIAGNOSIS — R202 Paresthesia of skin: Secondary | ICD-10-CM | POA: Insufficient documentation

## 2023-07-22 DIAGNOSIS — H6123 Impacted cerumen, bilateral: Secondary | ICD-10-CM

## 2023-07-22 DIAGNOSIS — E559 Vitamin D deficiency, unspecified: Secondary | ICD-10-CM | POA: Diagnosis not present

## 2023-07-22 DIAGNOSIS — R200 Anesthesia of skin: Secondary | ICD-10-CM

## 2023-07-22 LAB — SEDIMENTATION RATE: Sed Rate: 27 mm/h — ABNORMAL HIGH (ref 0–20)

## 2023-07-22 LAB — C-REACTIVE PROTEIN: CRP: <1 mg/dL (ref 0.5–20.0)

## 2023-07-22 LAB — TSH: TSH: 2.26 u[IU]/mL (ref 0.35–5.50)

## 2023-07-22 LAB — VITAMIN B12: Vitamin B-12: 169 pg/mL — ABNORMAL LOW (ref 211–911)

## 2023-07-22 LAB — CK: Total CK: 328 U/L — ABNORMAL HIGH (ref 7–232)

## 2023-07-22 MED ORDER — CYANOCOBALAMIN 1000 MCG/ML IJ SOLN
1000.0000 ug | Freq: Once | INTRAMUSCULAR | Status: AC
  Administered 2023-07-22: 1000 ug via INTRAMUSCULAR

## 2023-07-22 NOTE — Progress Notes (Signed)
 Ph: [phone] Fax: [phone]   Patient ID: Jeffrey Villarreal, male    DOB: 10-24-1940, 83 y.o.   MRN: [id number]  This visit was conducted in person.  BP 134/82   Pulse (!) 59   Temp 97.7 F (36.5 C) (Oral)   Ht 5' 4.5 (1.638 m)   Wt 187 lb 6 oz (85 kg)   SpO2 99%   BMI 31.67 kg/m    CC: leg numbness Subjective:   HPI: Jeffrey Villarreal is a 83 y.o. male presenting on 07/22/2023 for Numbness (C/o B leg numbness- worse in R. Started aobut 2 mos ago. Mainly occurs after walking on concrete. Pt accompanied by wife, Jeffrey Villarreal.)   2 mo h/o leg numbness R>L anterior thighs to feet worse with prolonged walking (10-76min) especially on concrete ie walking [employer]. This also happens when just sitting. Trouble sleeping supine in bed due to same. Sleeping in a recliner. Wife has noted he's walking more stooped over. No claudication. No tingling or burning pain of feet or upper extremities. No significant low back pain.   Denies inciting trauma/injury or fall, no saddle anesthesia, fevers/chills, bowel/bladder accidents.   Recently found to have low b12 - has not yet started replacement  Also having chronic shoulder and muscle aches. No stiffness, no hip pain.  Lab Results  Component Value Date   HGBA1C 5.9 (A) 04/19/2023    Lab Results  Component Value Date   VITAMINB12 169 (L) 07/22/2023   Lumbar spine films 03/2023 through ortho: AP lateral radiographs lumbar spine reviewed. 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 Significant degenerative disc disease also present seen on the lateral view at L5-S1. Moderate anterior spurring is present. Moderate degenerative disc disease without bone-on-bone changes noted from L1-L4. No spondylolisthesis or compression fractures.   Saw ortho for back and shoulder pain s/p prednisone  taper with significant benefit. He completed physical therapy and has home exercises he continues for R shoulder and lower back.  Lab Results   Component Value Date   ESRSEDRATE 49 (H) 07/22/2023   Also notes L>R hearing loss     Relevant past medical, surgical, family and social history reviewed and updated as indicated. Interim medical history since our last visit reviewed. Allergies and medications reviewed and updated. Outpatient Medications Prior to Visit  Medication Sig Dispense Refill   amLODipine  (NORVASC ) 10 MG tablet Take 1 tablet (10 mg total) by mouth daily. For blood pressure 90 tablet 4   aspirin EC 81 MG tablet Take 81 mg by mouth daily.     atenolol  (TENORMIN ) 25 MG tablet Take 1 tablet (25 mg total) by mouth daily. For blood pressure 90 tablet 4   Cholecalciferol (VITAMIN D3) 1.25 MG (50000 UT) TABS Take 1 tablet by mouth once a week. 12 tablet 3   lisinopril  (ZESTRIL ) 20 MG tablet Take 1 tablet (20 mg total) by mouth daily. For blood pressure 90 tablet 4   polyethylene glycol powder (GLYCOLAX /MIRALAX ) powder Take 17 g by mouth daily as needed for moderate constipation. 850 g 1   pravastatin  (PRAVACHOL ) 80 MG tablet Take 1 tablet (80 mg total) by mouth daily. For cholesterol 90 tablet 4   tiZANidine  (ZANAFLEX ) 2 MG tablet TAKE 1 TABLET BY MOUTH TWICE DAILY AS NEEDED FOR MUSCLE SPASMS, MAY CAUSE DROWSINESS 30 tablet 0   No facility-administered medications prior to visit.     Per HPI unless specifically indicated in ROS section below Review of Systems  Objective:  BP 134/82  Pulse (!) 59   Temp 97.7 F (36.5 C) (Oral)   Ht 5' 4.5 (1.638 m)   Wt 187 lb 6 oz (85 kg)   SpO2 99%   BMI 31.67 kg/m   Wt Readings from Last 3 Encounters:  07/22/23 187 lb 6 oz (85 kg)  04/19/23 181 lb (82.1 kg)  10/13/22 186 lb 8 oz (84.6 kg)      Physical Exam Vitals and nursing note reviewed.  Constitutional:      Appearance: Normal appearance. He is not ill-appearing.  HENT:     Head: Normocephalic and atraumatic.     Right Ear: Ear canal and external ear normal. There is impacted cerumen.     Left Ear: Ear canal  and external ear normal. There is impacted cerumen.     Mouth/Throat:     Mouth: Mucous membranes are moist.     Pharynx: Oropharynx is clear. No oropharyngeal exudate or posterior oropharyngeal erythema.  Eyes:     Extraocular Movements: Extraocular movements intact.     Conjunctiva/sclera: Conjunctivae normal.     Pupils: Pupils are equal, round, and reactive to light.  Cardiovascular:     Rate and Rhythm: Normal rate and regular rhythm.     Pulses: Normal pulses.     Heart sounds: Murmur (2/6 systolic USB) heard.  Pulmonary:     Effort: Pulmonary effort is normal. No respiratory distress.     Breath sounds: Normal breath sounds. No wheezing, rhonchi or rales.  Musculoskeletal:     Right lower leg: No edema.     Left lower leg: No edema.     Comments:  Diminished pedal pulses bilaterally without symptoms of claudication  No pain midline spine No paraspinous mm tenderness Neg SLR bilaterally. Discomfort to left groin with int rotation at hip.  Skin:    General: Skin is warm and dry.  Neurological:     Mental Status: He is alert.     Sensory: Sensation is intact.     Motor: Motor function is intact.     Coordination: Coordination is intact.     Gait: Gait is intact.     Deep Tendon Reflexes:     Reflex Scores:      Patellar reflexes are 0 on the right side and 0 on the left side.      Achilles reflexes are 0 on the right side and 0 on the left side.    Comments:  Diminished DTRs BLE 5/5 strength BLE Sensation intact  Psychiatric:        Mood and Affect: Mood normal.        Behavior: Behavior normal.       Results for orders placed or performed in visit on 07/22/23  Vitamin B12   Collection Time: 07/22/23 10:48 AM  Result Value Ref Range   Vitamin B-12 169 (L) 211 - 911 pg/mL  CK   Collection Time: 07/22/23 10:48 AM  Result Value Ref Range   Total CK 328 (H) 7 - 232 U/L  Sedimentation rate   Collection Time: 07/22/23 10:48 AM  Result Value Ref Range   Sed  Rate 27 (H) 0 - 20 mm/hr  TSH   Collection Time: 07/22/23 10:48 AM  Result Value Ref Range   TSH 2.26 0.35 - 5.50 uIU/mL  C-reactive protein   Collection Time: 07/22/23 10:48 AM  Result Value Ref Range   CRP <1.0 0.5 - 20.0 mg/dL    Assessment & Plan:   Problem List Items  Addressed This Visit     Type 2 diabetes mellitus with other specified complication (HCC)   Chronic, recently in prediabetes range.       Vitamin B12 deficiency   Levels markedly low when checked last year - he has not started replacement. Update levels then B12 shot today and weekly x4 wks then monthly x6 months.  Reassess numbness after treatment.       Vitamin D  deficiency   Rec continue weekly Rx replacement      Bilateral hearing loss due to cerumen impaction   S/p unsuccessful ear irrigation.  Pt agrees to ENT referral.       Relevant Orders   Ambulatory referral to ENT   Bilateral leg numbness - Primary   Suspect related to vitamin b12 deficiency, r/o neurogenic claudication/LSS.  No red flags of cauda equina syndrome, not consistent with PAD/claudication.   Update labs as per above. Start B12 IM replacement, reassess at f/u.       Relevant Orders   Vitamin B12 (Completed)   CK (Completed)   Sedimentation rate (Completed)   TSH (Completed)   Body aches   Check CPK in statin use.  Check ESR/CRP r/o PMR      Relevant Orders   CK (Completed)   C-reactive protein (Completed)     Meds ordered this encounter  Medications   cyanocobalamin  (VITAMIN B12) injection 1,000 mcg    Orders Placed This Encounter  Procedures   Vitamin B12   CK   Sedimentation rate   TSH   C-reactive protein   Ambulatory referral to ENT    Referral Priority:   Routine    Referral Type:   Consultation    Referral Reason:   Specialty Services Required    Requested Specialty:   Otolaryngology    Number of Visits Requested:   1    Patient Instructions  Hearing test today after ear irrigation for earwax.   I think part of these symptoms are coming from low vitamin b12 Labs today followed by b12 shot. Then schedule weekly B12 shots for 4 weeks total then monthly for 6 months and we will reassess b12 levels.  Restart weekly vitamin D .  Let us  know if symptoms not improving with vitamin B12 replacement shots.   Follow up plan: Return in about 6 weeks (around 09/02/2023) for follow up visit.  Anton Blas, MD

## 2023-07-22 NOTE — Assessment & Plan Note (Signed)
 Rec continue weekly Rx replacement

## 2023-07-22 NOTE — Assessment & Plan Note (Addendum)
 S/p unsuccessful ear irrigation.  Pt agrees to ENT referral.

## 2023-07-22 NOTE — Assessment & Plan Note (Addendum)
 Chronic, recently in prediabetes range.

## 2023-07-22 NOTE — Assessment & Plan Note (Addendum)
 Suspect related to vitamin b12 deficiency, r/o neurogenic claudication/LSS.  No red flags of cauda equina syndrome, not consistent with PAD/claudication.   Update labs as per above. Start B12 IM replacement, reassess at f/u.

## 2023-07-22 NOTE — Assessment & Plan Note (Signed)
 Levels markedly low when checked last year - he has not started replacement. Update levels then B12 shot today and weekly x4 wks then monthly x6 months.  Reassess numbness after treatment.

## 2023-07-22 NOTE — Patient Instructions (Addendum)
 Hearing test today after ear irrigation for earwax.  I think part of these symptoms are coming from low vitamin b12 Labs today followed by b12 shot. Then schedule weekly B12 shots for 4 weeks total then monthly for 6 months and we will reassess b12 levels.  Restart weekly vitamin D .  Let us  know if symptoms not improving with vitamin B12 replacement shots.

## 2023-07-23 DIAGNOSIS — R52 Pain, unspecified: Secondary | ICD-10-CM | POA: Insufficient documentation

## 2023-07-23 NOTE — Assessment & Plan Note (Signed)
 Check CPK in statin use.  Check ESR/CRP r/o PMR

## 2023-08-04 ENCOUNTER — Ambulatory Visit: Payer: Medicare Other

## 2023-08-11 ENCOUNTER — Ambulatory Visit (INDEPENDENT_AMBULATORY_CARE_PROVIDER_SITE_OTHER): Payer: Medicare Other

## 2023-08-11 DIAGNOSIS — E538 Deficiency of other specified B group vitamins: Secondary | ICD-10-CM

## 2023-08-11 MED ORDER — CYANOCOBALAMIN 1000 MCG/ML IJ SOLN
1000.0000 ug | Freq: Once | INTRAMUSCULAR | Status: AC
  Administered 2023-08-11: 1000 ug via INTRAMUSCULAR

## 2023-08-11 NOTE — Progress Notes (Signed)
 Per orders of Dr. Eustaquio Boyden, injection of vitamin b 12 given by Lewanda Rife in left deltoid. Patient tolerated injection well. Patient will make appointment for 1 week.

## 2023-08-18 ENCOUNTER — Ambulatory Visit: Payer: Medicare Other

## 2023-08-18 ENCOUNTER — Encounter: Payer: Self-pay | Admitting: Podiatry

## 2023-08-18 ENCOUNTER — Ambulatory Visit (INDEPENDENT_AMBULATORY_CARE_PROVIDER_SITE_OTHER): Payer: Medicare Other | Admitting: Podiatry

## 2023-08-18 VITALS — Ht 64.5 in | Wt 187.0 lb

## 2023-08-18 DIAGNOSIS — M79674 Pain in right toe(s): Secondary | ICD-10-CM | POA: Diagnosis not present

## 2023-08-18 DIAGNOSIS — M79675 Pain in left toe(s): Secondary | ICD-10-CM

## 2023-08-18 DIAGNOSIS — B351 Tinea unguium: Secondary | ICD-10-CM

## 2023-08-18 DIAGNOSIS — E1169 Type 2 diabetes mellitus with other specified complication: Secondary | ICD-10-CM

## 2023-08-18 DIAGNOSIS — E538 Deficiency of other specified B group vitamins: Secondary | ICD-10-CM

## 2023-08-18 MED ORDER — CYANOCOBALAMIN 1000 MCG/ML IJ SOLN
1000.0000 ug | Freq: Once | INTRAMUSCULAR | Status: AC
  Administered 2023-08-18: 1000 ug via INTRAMUSCULAR

## 2023-08-18 NOTE — Progress Notes (Signed)
 Per orders of Dr. Crawford Givens, injection of B-12 given by Donnamarie Poag in Right deltoid. Patient tolerated injection well. Patient will make appointment for next scheduled injection.

## 2023-08-18 NOTE — Progress Notes (Signed)
 This patient returns to my office for at risk foot care.  This patient requires this care by a professional since this patient will be at risk due to having diabetes  and RI.   Patient denies diabetes. This patient is unable to cut nails himself since the patient cannot reach his nails.These nails are painful walking and wearing shoes.   Patient has not been seen in over 6 months. This patient presents for at risk foot care today.  General Appearance  Alert, conversant and in no acute stress.  Vascular  Dorsalis pedis and posterior tibial  pulses are weakly  palpable  bilaterally.  Capillary return is within normal limits  bilaterally. Temperature is within normal limits  bilaterally.  Neurologic  Senn-Weinstein monofilament wire test within normal limits/diminished   bilaterally. Muscle power within normal limits bilaterally.  Nails Thick disfigured discolored nails with subungual debris  from hallux to fifth toes bilaterally. No evidence of bacterial infection or drainage bilaterally.  Orthopedic  No limitations of motion  feet .  No crepitus or effusions noted.  No bony pathology or digital deformities noted.  Skin  normotropic skin with no porokeratosis noted bilaterally.  No signs of infections or ulcers noted.     Onychomycosis  Pain in right toes  Pain in left toes  Consent was obtained for treatment procedures.   Mechanical debridement of nails 1-5  bilaterally performed with a nail nipper.  Filed with dremel without incident.    Return office visit    10 weeks                 Told patient to return for periodic foot care and evaluation due to potential at risk complications.   Helane Gunther DPM

## 2023-09-02 ENCOUNTER — Encounter: Payer: Self-pay | Admitting: Family Medicine

## 2023-09-02 ENCOUNTER — Ambulatory Visit: Payer: Medicare Other | Admitting: Family Medicine

## 2023-09-02 VITALS — BP 148/68 | HR 59 | Temp 98.0°F | Ht 64.5 in | Wt 185.1 lb

## 2023-09-02 DIAGNOSIS — E1169 Type 2 diabetes mellitus with other specified complication: Secondary | ICD-10-CM

## 2023-09-02 DIAGNOSIS — E538 Deficiency of other specified B group vitamins: Secondary | ICD-10-CM | POA: Diagnosis not present

## 2023-09-02 DIAGNOSIS — R200 Anesthesia of skin: Secondary | ICD-10-CM

## 2023-09-02 DIAGNOSIS — I10 Essential (primary) hypertension: Secondary | ICD-10-CM | POA: Diagnosis not present

## 2023-09-02 DIAGNOSIS — E785 Hyperlipidemia, unspecified: Secondary | ICD-10-CM

## 2023-09-02 MED ORDER — CYANOCOBALAMIN 1000 MCG/ML IJ SOLN
1000.0000 ug | Freq: Once | INTRAMUSCULAR | Status: AC
Start: 2023-09-02 — End: 2023-09-02
  Administered 2023-09-02: 1000 ug via INTRAMUSCULAR

## 2023-09-02 NOTE — Assessment & Plan Note (Signed)
 Significant improvement since replacing B12 vitamin levels.

## 2023-09-02 NOTE — Assessment & Plan Note (Addendum)
 Rpt b12 shot today then start monthly x6 months the consider transition to oral replacement. Suspect dietary deficiency contributory - discussed dietary sources of B12. Consider checking IF Ab next labwork given marked low levels.

## 2023-09-02 NOTE — Progress Notes (Signed)
 Ph: [phone] Fax: [phone]   Patient ID: Jeffrey Villarreal, male    DOB: [birth year], 83 y.o.   MRN: [id number]  This visit was conducted in person.  BP (!) 148/68 (BP Location: Right Arm, Cuff Size: Large)   Pulse (!) 59   Temp 98 F (36.7 C) (Oral)   Ht 5' 4.5" (1.638 m)   Wt 185 lb 2 oz (84 kg)   SpO2 97%   BMI 31.29 kg/m   BP Readings from Last 3 Encounters:  09/02/23 (!) 148/68  07/22/23 134/82  04/19/23 134/68    CC: 6 wk f/u visit  Subjective:   HPI: Jeffrey Villarreal is a 83 y.o. male presenting on 09/02/2023 for Medical Management of Chronic Issues (Here for 6 wk f/u.)   See prior note for details.  Last visit described 2 months of R>L leg numbness to anterior thighs and feet, found to have vit B12 deficiency (169) started on weekly B12 shots (received 3 total, last dose 08/18/2023). Notes significant improvement since starting b12 shots!  He notes he doesn't eat pork or beef. He does like baked fish and chicken. Avoids fried foods.   Lumbar spine films 03/2023 through ortho: AP lateral radiographs lumbar spine reviewed. 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 Significant degenerative disc disease also present seen on the lateral view at L5-S1. Moderate anterior spurring is present. Moderate degenerative disc disease without bone-on-bone changes noted from L1-L4. No spondylolisthesis or compression fractures.   HTN - Compliant with current antihypertensive regimen of amlodipine 10mg  daily, lisinopril 20mg  daily, atenolol 25mg  daily.  Does not regularly check blood pressures at home: last checked 4 wks ago and normal. No low blood pressure readings or symptoms of dizziness/syncope. Denies HA, vision changes, CP/tightness, SOB, leg swelling.      Relevant past medical, surgical, family and social history reviewed and updated as indicated. Interim medical history since our last visit reviewed. Allergies and medications reviewed and  updated. Outpatient Medications Prior to Visit  Medication Sig Dispense Refill   amLODipine (NORVASC) 10 MG tablet Take 1 tablet (10 mg total) by mouth daily. For blood pressure 90 tablet 4   aspirin EC 81 MG tablet Take 81 mg by mouth daily.     atenolol (TENORMIN) 25 MG tablet Take 1 tablet (25 mg total) by mouth daily. For blood pressure 90 tablet 4   Cholecalciferol (VITAMIN D3) 1.25 MG (50000 UT) TABS Take 1 tablet by mouth once a week. 12 tablet 3   lisinopril (ZESTRIL) 20 MG tablet Take 1 tablet (20 mg total) by mouth daily. For blood pressure 90 tablet 4   polyethylene glycol powder (GLYCOLAX/MIRALAX) powder Take 17 g by mouth daily as needed for moderate constipation. 850 g 1   pravastatin (PRAVACHOL) 80 MG tablet Take 1 tablet (80 mg total) by mouth daily. For cholesterol 90 tablet 4   tiZANidine (ZANAFLEX) 2 MG tablet TAKE 1 TABLET BY MOUTH TWICE DAILY AS NEEDED FOR MUSCLE SPASMS, MAY CAUSE DROWSINESS 30 tablet 0   No facility-administered medications prior to visit.     Per HPI unless specifically indicated in ROS section below Review of Systems  Objective:  BP (!) 148/68 (BP Location: Right Arm, Cuff Size: Large)   Pulse (!) 59   Temp 98 F (36.7 C) (Oral)   Ht 5' 4.5" (1.638 m)   Wt 185 lb 2 oz (84 kg)   SpO2 97%   BMI 31.29 kg/m   Wt Readings from  Last 3 Encounters:  09/02/23 185 lb 2 oz (84 kg)  08/18/23 187 lb (84.8 kg)  07/22/23 187 lb 6 oz (85 kg)      Physical Exam Vitals and nursing note reviewed.  Constitutional:      Appearance: Normal appearance. He is not ill-appearing.  HENT:     Mouth/Throat:     Mouth: Mucous membranes are moist.     Pharynx: Oropharynx is clear. No oropharyngeal exudate or posterior oropharyngeal erythema.  Eyes:     Extraocular Movements: Extraocular movements intact.     Conjunctiva/sclera: Conjunctivae normal.     Pupils: Pupils are equal, round, and reactive to light.  Cardiovascular:     Rate and Rhythm: Normal rate and  regular rhythm.     Pulses: Normal pulses.     Heart sounds: Murmur (2/6 systolic USB) heard.  Pulmonary:     Effort: Pulmonary effort is normal. No respiratory distress.     Breath sounds: Normal breath sounds. No wheezing, rhonchi or rales.  Musculoskeletal:     Right lower leg: No edema.     Left lower leg: No edema.  Skin:    General: Skin is warm and dry.     Findings: No rash.  Neurological:     Mental Status: He is alert.  Psychiatric:        Mood and Affect: Mood normal.        Behavior: Behavior normal.       Results for orders placed or performed in visit on 07/22/23  Vitamin B12   Collection Time: 07/22/23 10:48 AM  Result Value Ref Range   Vitamin B-12 169 (L) 211 - 911 pg/mL  CK   Collection Time: 07/22/23 10:48 AM  Result Value Ref Range   Total CK 328 (H) 7 - 232 U/L  Sedimentation rate   Collection Time: 07/22/23 10:48 AM  Result Value Ref Range   Sed Rate 27 (H) 0 - 20 mm/hr  TSH   Collection Time: 07/22/23 10:48 AM  Result Value Ref Range   TSH 2.26 0.35 - 5.50 uIU/mL  C-reactive protein   Collection Time: 07/22/23 10:48 AM  Result Value Ref Range   CRP <1.0 0.5 - 20.0 mg/dL    Assessment & Plan:   Problem List Items Addressed This Visit     Hypertension   Chronic, deteriorated today but improves on recheck.  He just recently took am BP meds.  BP log sheet provided and I did ask him to start monitoring BP more closely - and let me know if consistently >140/90 to titrate antihypertensive accordingly.       Hyperlipidemia associated with type 2 diabetes mellitus (HCC)   Discussed optimal dosing of pravastatin at night time.       Vitamin B12 deficiency - Primary   Rpt b12 shot today then start monthly x6 months the consider transition to oral replacement. Suspect dietary deficiency contributory - discussed dietary sources of B12. Consider checking IF Ab next labwork given marked low levels.       Bilateral leg numbness   Significant  improvement since replacing B12 vitamin levels.        Meds ordered this encounter  Medications   cyanocobalamin (VITAMIN B12) injection 1,000 mcg    No orders of the defined types were placed in this encounter.   Patient Instructions  B12 shot today  Then schedule monthly B12 shots for 6 months (nurse visit).  Try pravastatin for cholesterol at night time Blood  pressures were elevated today - start monitoring BP more closely at home, BP log sheet provided today to check a few times a week and jot down readings. Let me know if consistently >140/90 to consider medication change.  Schedule physical /wellness visit for after 10/13/2023.   Follow up plan: Return in about 2 months (around 11/02/2023) for annual exam, prior fasting for blood work, medicare wellness visit.  Eustaquio Boyden, MD

## 2023-09-02 NOTE — Assessment & Plan Note (Addendum)
 Chronic, deteriorated today but improves on recheck.  He just recently took am BP meds.  BP log sheet provided and I did ask him to start monitoring BP more closely - and let me know if consistently >140/90 to titrate antihypertensive accordingly.

## 2023-09-02 NOTE — Patient Instructions (Addendum)
 B12 shot today  Then schedule monthly B12 shots for 6 months (nurse visit).  Try pravastatin for cholesterol at night time Blood pressures were elevated today - start monitoring BP more closely at home, BP log sheet provided today to check a few times a week and jot down readings. Let me know if consistently >140/90 to consider medication change.  Schedule physical /wellness visit for after 10/13/2023.

## 2023-09-02 NOTE — Assessment & Plan Note (Signed)
 Discussed optimal dosing of pravastatin at night time.

## 2023-09-22 ENCOUNTER — Ambulatory Visit (INDEPENDENT_AMBULATORY_CARE_PROVIDER_SITE_OTHER): Payer: Medicare Other

## 2023-09-22 DIAGNOSIS — E538 Deficiency of other specified B group vitamins: Secondary | ICD-10-CM

## 2023-09-22 MED ORDER — CYANOCOBALAMIN 1000 MCG/ML IJ SOLN
1000.0000 ug | Freq: Once | INTRAMUSCULAR | Status: AC
  Administered 2023-09-22: 1000 ug via INTRAMUSCULAR

## 2023-09-22 NOTE — Progress Notes (Signed)
 Per orders of Dr. Crawford Givens, injection of B-12 given by Nanci Pina in right deltoid. Patient tolerated injection well.

## 2023-10-04 ENCOUNTER — Ambulatory Visit: Admitting: General Practice

## 2023-10-07 ENCOUNTER — Ambulatory Visit (INDEPENDENT_AMBULATORY_CARE_PROVIDER_SITE_OTHER): Admitting: Family Medicine

## 2023-10-07 ENCOUNTER — Encounter: Payer: Self-pay | Admitting: Family Medicine

## 2023-10-07 VITALS — BP 148/78 | HR 57 | Temp 98.0°F | Ht 64.5 in | Wt 182.2 lb

## 2023-10-07 DIAGNOSIS — E785 Hyperlipidemia, unspecified: Secondary | ICD-10-CM | POA: Diagnosis not present

## 2023-10-07 DIAGNOSIS — R079 Chest pain, unspecified: Secondary | ICD-10-CM | POA: Diagnosis not present

## 2023-10-07 DIAGNOSIS — E538 Deficiency of other specified B group vitamins: Secondary | ICD-10-CM

## 2023-10-07 DIAGNOSIS — I10 Essential (primary) hypertension: Secondary | ICD-10-CM

## 2023-10-07 DIAGNOSIS — E1169 Type 2 diabetes mellitus with other specified complication: Secondary | ICD-10-CM | POA: Diagnosis not present

## 2023-10-07 DIAGNOSIS — R011 Cardiac murmur, unspecified: Secondary | ICD-10-CM | POA: Diagnosis not present

## 2023-10-07 LAB — CBC WITH DIFFERENTIAL/PLATELET
Basophils Absolute: 0 10*3/uL (ref 0.0–0.1)
Basophils Relative: 0.7 % (ref 0.0–3.0)
Eosinophils Absolute: 0.4 10*3/uL (ref 0.0–0.7)
Eosinophils Relative: 7.2 % — ABNORMAL HIGH (ref 0.0–5.0)
HCT: 42.3 % (ref 39.0–52.0)
Hemoglobin: 14.2 g/dL (ref 13.0–17.0)
Lymphocytes Relative: 26.4 % (ref 12.0–46.0)
Lymphs Abs: 1.3 10*3/uL (ref 0.7–4.0)
MCHC: 33.6 g/dL (ref 30.0–36.0)
MCV: 84.7 fl (ref 78.0–100.0)
Monocytes Absolute: 0.4 10*3/uL (ref 0.1–1.0)
Monocytes Relative: 7.9 % (ref 3.0–12.0)
Neutro Abs: 2.9 10*3/uL (ref 1.4–7.7)
Neutrophils Relative %: 57.8 % (ref 43.0–77.0)
Platelets: 279 10*3/uL (ref 150.0–400.0)
RBC: 4.99 Mil/uL (ref 4.22–5.81)
RDW: 13.6 % (ref 11.5–15.5)
WBC: 5.1 10*3/uL (ref 4.0–10.5)

## 2023-10-07 LAB — LIPID PANEL
Cholesterol: 109 mg/dL (ref 0–200)
HDL: 42.4 mg/dL (ref >39.00)
LDL Cholesterol: 55 mg/dL (ref 0–99)
NonHDL: 67.05
Total CHOL/HDL Ratio: 3
Triglycerides: 60 mg/dL (ref 0.0–149.0)
VLDL: 12 mg/dL (ref 0.0–40.0)

## 2023-10-07 LAB — BASIC METABOLIC PANEL WITH GFR
BUN: 17 mg/dL (ref 6–23)
CO2: 30 meq/L (ref 19–32)
Calcium: 9.3 mg/dL (ref 8.4–10.5)
Chloride: 104 meq/L (ref 96–112)
Creatinine, Ser: 1.27 mg/dL (ref 0.40–1.50)
GFR: 52.46 mL/min — ABNORMAL LOW (ref >60.00)
Glucose, Bld: 103 mg/dL — ABNORMAL HIGH (ref 70–99)
Potassium: 3.9 meq/L (ref 3.5–5.1)
Sodium: 141 meq/L (ref 135–145)

## 2023-10-07 LAB — VITAMIN B12: Vitamin B-12: 526 pg/mL (ref 211–911)

## 2023-10-07 LAB — HEMOGLOBIN A1C: Hgb A1c MFr Bld: 6.3 % (ref 4.6–6.5)

## 2023-10-07 MED ORDER — HYDRALAZINE HCL 25 MG PO TABS: 25.0000 mg | ORAL_TABLET | Freq: Two times a day (BID) | ORAL | 3 refills | Status: DC

## 2023-10-07 NOTE — Assessment & Plan Note (Addendum)
 Update echocardiogram in setting of recent chest pain.

## 2023-10-07 NOTE — Assessment & Plan Note (Signed)
 Continues monthly B12 shots latest 09/27/2023 - update b12 levels today with IF Ab.

## 2023-10-07 NOTE — Patient Instructions (Addendum)
 Check up front if wellness visit 5/1 is in person or phone call  Labs today  EKG today  Stop atenolol . In its place start hydralazine 25mg  twice daily.  I'd like to refer you to cardiology for further investigation of recent chest pain.  Return in 1-2 months for physical

## 2023-10-07 NOTE — Assessment & Plan Note (Signed)
 Update FLP on continued pravastatin  80mg  daily.  The ASCVD Risk score (Arnett DK, et al., 2019) failed to calculate for the following reasons:   The 2019 ASCVD risk score is only valid for ages 40 to 82

## 2023-10-07 NOTE — Progress Notes (Signed)
 Ph: [phone] Fax: [phone]   Patient ID: Jeffrey Villarreal, male    DOB: 1941/03/15, 83 y.o.   MRN: [id number]  This visit was conducted in person.  BP (!) 148/78   Pulse (!) 57   Temp 98 F (36.7 C) (Oral)   Ht 5' 4.5" (1.638 m)   Wt 182 lb 4 oz (82.7 kg)   SpO2 97%   BMI 30.80 kg/m    CC: med f/u visit  Subjective:   HPI: Jeffrey Villarreal is a 83 y.o. male presenting on 10/07/2023 for Medical Management of Chronic Issues (Here to discuss meds. )   AMW scheduled for 10/13/2023. Last CPE 10/13/2022 - too soon for this.   Episode of chest pain last week while at rest - EMS called out to house - BP 114/51, HR 54, O2 99%. He was treated with aspirin 325mg  with resolution of chest pain. Describes "hard" heavy pain worse with moving R arm past midline.  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 Pretty similar to prior EKG 2019 but with new AV block No dyspnea, diaphoresis, nausea with this. No jaw pain or L arm pain.   HTN - Compliant with current antihypertensive regimen of amlodipine  10mg  daily, atenolol  25mg  daily, and lisinporil 20mg  daily.  Does not check blood pressures at home. No low blood pressure readings or symptoms of dizziness/syncope. Denies HA, vision changes, CP/tightness, SOB, leg swelling.    Vit B12 deficiency - receiving monthly B12 shots for 6 months then will transition to oral replacement. Lab Results  Component Value Date   VITAMINB12 169 (L) 07/22/2023       Relevant past medical, surgical, family and social history reviewed and updated as indicated. Interim medical history since our last visit reviewed. Allergies and medications reviewed and updated. Outpatient Medications Prior to Visit  Medication Sig Dispense Refill   amLODipine  (NORVASC ) 10 MG tablet Take 1 tablet (10 mg total) by mouth daily. For blood pressure 90 tablet 4   aspirin EC 81 MG tablet Take 81 mg by mouth daily.     Cholecalciferol  (VITAMIN D3) 1.25 MG (50000 UT) TABS Take 1 tablet by mouth once a week. 12 tablet 3   lisinopril  (ZESTRIL ) 20 MG tablet Take 1 tablet (20 mg total) by mouth daily. For blood pressure 90 tablet 4   polyethylene glycol powder (GLYCOLAX /MIRALAX ) powder Take 17 g by mouth daily as needed for moderate constipation. 850 g 1   pravastatin  (PRAVACHOL ) 80 MG tablet Take 1 tablet (80 mg total) by mouth daily. For cholesterol 90 tablet 4   tiZANidine  (ZANAFLEX ) 2 MG tablet TAKE 1 TABLET BY MOUTH TWICE DAILY AS NEEDED FOR MUSCLE SPASMS, MAY CAUSE DROWSINESS 30 tablet 0   atenolol  (TENORMIN ) 25 MG tablet Take 1 tablet (25 mg total) by mouth daily. For blood pressure 90 tablet 4   No facility-administered medications prior to visit.     Per HPI unless specifically indicated in ROS section below Review of Systems  Objective:  BP (!) 148/78   Pulse (!) 57   Temp 98 F (36.7 C) (Oral)   Ht 5' 4.5" (1.638 m)   Wt 182 lb 4 oz (82.7 kg)   SpO2 97%   BMI 30.80 kg/m   Wt Readings from Last 3 Encounters:  10/07/23 182 lb 4 oz (82.7 kg)  09/02/23 185 lb 2 oz (84 kg)  08/18/23 187 lb (84.8 kg)  Physical Exam Vitals and nursing note reviewed.  Constitutional:      Appearance: Normal appearance. He is not ill-appearing.  HENT:     Head: Normocephalic and atraumatic.     Mouth/Throat:     Mouth: Mucous membranes are moist.     Pharynx: Oropharynx is clear. No oropharyngeal exudate or posterior oropharyngeal erythema.  Eyes:     Extraocular Movements: Extraocular movements intact.     Conjunctiva/sclera: Conjunctivae normal.     Pupils: Pupils are equal, round, and reactive to light.  Cardiovascular:     Rate and Rhythm: Normal rate and regular rhythm.     Pulses: Normal pulses.     Heart sounds: Murmur (3/6 systolic USB) heard.  Pulmonary:     Effort: Pulmonary effort is normal. No respiratory distress.     Breath sounds: Normal breath sounds. No wheezing, rhonchi or rales.     Comments: No  reproducible chest wall tenderness Chest:     Chest wall: No tenderness.  Musculoskeletal:     Right lower leg: No edema.     Left lower leg: No edema.  Skin:    General: Skin is warm and dry.     Findings: No rash.  Neurological:     Mental Status: He is alert.  Psychiatric:        Mood and Affect: Mood normal.        Behavior: Behavior normal.       Results for orders placed or performed in visit on 07/22/23  Vitamin B12   Collection Time: 07/22/23 10:48 AM  Result Value Ref Range   Vitamin B-12 169 (L) 211 - 911 pg/mL  CK   Collection Time: 07/22/23 10:48 AM  Result Value Ref Range   Total CK 328 (H) 7 - 232 U/L  Sedimentation rate   Collection Time: 07/22/23 10:48 AM  Result Value Ref Range   Sed Rate 27 (H) 0 - 20 mm/hr  TSH   Collection Time: 07/22/23 10:48 AM  Result Value Ref Range   TSH 2.26 0.35 - 5.50 uIU/mL  C-reactive protein   Collection Time: 07/22/23 10:48 AM  Result Value Ref Range   CRP <1.0 0.5 - 20.0 mg/dL   Lab Results  Component Value Date   HGBA1C 5.9 (A) 04/19/2023   Lab Results  Component Value Date   NA 140 10/13/2022   CL 104 10/13/2022   K 4.3 10/13/2022   CO2 29 10/13/2022   BUN 13 10/13/2022   CREATININE 1.35 10/13/2022   GFR 49.09 (L) 10/13/2022   CALCIUM  9.5 10/13/2022   PHOS 2.9 02/03/2021   ALBUMIN 4.3 10/13/2022   GLUCOSE 97 10/13/2022    Lab Results  Component Value Date   CHOL 129 10/13/2022   HDL 41.60 10/13/2022   LDLCALC 75 10/13/2022   LDLDIRECT 86.0 02/20/2015   TRIG 64.0 10/13/2022   CHOLHDL 3 10/13/2022    Lab Results  Component Value Date   WBC 6.0 10/13/2022   HGB 14.7 10/13/2022   HCT 43.2 10/13/2022   MCV 83.8 10/13/2022   PLT 308.0 10/13/2022   EKG - sinus bradycardia, PR 200 (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   Assessment & Plan:   Problem List Items Addressed This Visit     Hypertension   Chronic, BP remaining above goal.  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  Stop atenolol , start hydralazine 25mg  BID. Continue lisinopril  and amlodipine .  Update labs as per below.       Relevant Medications   hydrALAZINE (APRESOLINE) 25 MG tablet   Other Relevant Orders   Basic metabolic panel with GFR   ECHOCARDIOGRAM COMPLETE   Ambulatory referral to Cardiology   Type 2 diabetes mellitus with other specified complication (HCC)   Update A1c - previously in prediabetes range.       Relevant Orders   Hemoglobin A1c   Hyperlipidemia associated with type 2 diabetes mellitus (HCC)   Update FLP on continued pravastatin  80mg  daily.  The ASCVD Risk score (Arnett DK, et al., 2019) failed to calculate for the following reasons:   The 2019 ASCVD risk score is only valid for ages 60 to 48       Relevant Medications   hydrALAZINE (APRESOLINE) 25 MG tablet   Other Relevant Orders   Lipid panel   Ambulatory referral to Cardiology   Vitamin B12 deficiency   Continues monthly B12 shots latest 09/27/2023 - update b12 levels today with IF Ab.       Relevant Orders   Vitamin B12   Intrinsic Factor Antibodies   Systolic murmur   Update echocardiogram in setting of recent chest pain.       Relevant Orders   ECHOCARDIOGRAM COMPLETE   Ambulatory referral to Cardiology   Chest pain - Primary   New overall atypical - not substernal, not exertional or relieved with rest.  Does have cardiac risk factors.  EKG in field with borderline AVB, LAD, LAFB, overall improved on recheck in office. Will proceed with echocardiogram.  Offered proceeding with cardiac CT with calcium  score to further assess personal cardiovascular risk vs cardiology referral - he requests cardiology evaluation. Referral placed.       Relevant Orders   Basic metabolic panel with GFR   CBC with Differential/Platelet   EKG 12-Lead (Completed)   ECHOCARDIOGRAM COMPLETE   Ambulatory referral to Cardiology     Meds ordered this encounter   Medications   hydrALAZINE (APRESOLINE) 25 MG tablet    Sig: Take 1 tablet (25 mg total) by mouth in the morning and at bedtime.    Dispense:  60 tablet    Refill:  3    To replace atenolol     Orders Placed This Encounter  Procedures   Basic metabolic panel with GFR   Vitamin B12   CBC with Differential/Platelet   Lipid panel   Hemoglobin A1c   Intrinsic Factor Antibodies   Ambulatory referral to Cardiology    Referral Priority:   Routine    Referral Type:   Consultation    Referral Reason:   Specialty Services Required    Number of Visits Requested:   1   EKG 12-Lead   ECHOCARDIOGRAM COMPLETE    Standing Status:   Future    Expiration Date:   10/06/2024    Where should this test be performed:   [facility])    Does the patient weigh less than or greater than 250 lbs?:   Patient weighs less than 250 lbs    Perflutren DEFINITY (image enhancing agent) should be administered unless hypersensitivity or allergy exist:   Administer Perflutren    Reason for exam-Echo:   Murmur R01.1    Patient Instructions  Check up front if wellness visit 5/1 is in person or phone call  Labs today  EKG today  Stop atenolol . In its place start hydralazine 25mg  twice daily.  I'd like to refer you to  cardiology for further investigation of recent chest pain.  Return in 1-2 months for physical  Follow up plan: Return in about 6 weeks (around 11/18/2023), or if symptoms worsen or fail to improve, for annual exam, prior fasting for blood work.  Claire Crick, MD

## 2023-10-07 NOTE — Assessment & Plan Note (Addendum)
 Chronic, BP remaining above goal.  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  Stop atenolol , start hydralazine 25mg  BID. Continue lisinopril  and amlodipine . Update labs as per below.

## 2023-10-07 NOTE — Assessment & Plan Note (Addendum)
 New overall atypical - not substernal, not exertional or relieved with rest.  Does have cardiac risk factors.  EKG in field with borderline AVB, LAD, LAFB, overall improved on recheck in office. Will proceed with echocardiogram.  Offered proceeding with cardiac CT with calcium  score to further assess personal cardiovascular risk vs cardiology referral - he requests cardiology evaluation. Referral placed.

## 2023-10-07 NOTE — Assessment & Plan Note (Signed)
 Update A1c - previously in prediabetes range.

## 2023-10-10 ENCOUNTER — Encounter: Payer: Self-pay | Admitting: Family Medicine

## 2023-10-10 DIAGNOSIS — D510 Vitamin B12 deficiency anemia due to intrinsic factor deficiency: Secondary | ICD-10-CM | POA: Insufficient documentation

## 2023-10-10 LAB — INTRINSIC FACTOR ANTIBODIES: Intrinsic Factor: POSITIVE — AB

## 2023-10-13 ENCOUNTER — Ambulatory Visit: Payer: Medicare Other

## 2023-10-13 VITALS — BP 122/78 | Ht 64.5 in | Wt 181.2 lb

## 2023-10-13 DIAGNOSIS — Z0000 Encounter for general adult medical examination without abnormal findings: Secondary | ICD-10-CM

## 2023-10-13 NOTE — Patient Instructions (Signed)
 Jeffrey Villarreal , Thank you for taking time to come for your Medicare Wellness Visit. I appreciate your ongoing commitment to your health goals. Please review the following plan we discussed and let me know if I can assist you in the future.   Referrals/Orders/Follow-Ups/Clinician Recommendations: none  This is a list of the screening recommended for you and due dates:  Health Maintenance  Topic Date Due   Zoster (Shingles) Vaccine (1 of 2) Never done   DTaP/Tdap/Td vaccine (2 - Tdap) 06/14/2017   Eye exam for diabetics  11/26/2022   COVID-19 Vaccine (4 - 2024-25 season) 02/13/2023   Yearly kidney health urinalysis for diabetes  10/13/2023   Flu Shot  01/13/2024   Hemoglobin A1C  04/07/2024   Complete foot exam   04/18/2024   Yearly kidney function blood test for diabetes  10/06/2024   Medicare Annual Wellness Visit  10/12/2024   Pneumonia Vaccine  Completed   HPV Vaccine  Aged Out   Meningitis B Vaccine  Aged Out   Hepatitis C Screening  Discontinued    Advanced directives: (Copy Requested) Please bring a copy of your health care power of attorney and living will to the office to be added to your chart at your convenience. You can mail to [facility] [address] [place], [place] [address] or email to [email or web address]  Next Medicare Annual Wellness Visit scheduled for next year: Yes 10/16/24 @ 3:40pm  Have you seen your provider in the last 6 months (3 months if uncontrolled diabetes)? Yes

## 2023-10-13 NOTE — Progress Notes (Addendum)
 Please attest and cosign this visit due to patients primary care provider not being in the office at the time the visit was completed.    Subjective:   Jeffrey Villarreal is a 83 y.o. who presents for a Medicare Wellness preventive visit.  Visit Complete: In person  Persons Participating in Visit: Patient.  AWV Questionnaire: No: Patient Medicare AWV questionnaire was not completed prior to this visit.  Cardiac Risk Factors include: advanced age (>49men, >62 women);diabetes mellitus;dyslipidemia;hypertension;male gender;obesity (BMI >30kg/m2)     Objective:    Today's Vitals   10/13/23 1503  BP: 122/78  Weight: 181 lb 3.2 oz (82.2 kg)  Height: 5' 4.5" (1.638 m)   Body mass index is 30.62 kg/m.     10/13/2023    3:19 PM 08/02/2022    2:33 PM 07/29/2020    1:13 PM 11/22/2018    9:34 AM 03/28/2014   10:43 PM  Advanced Directives  Does Patient Have a Medical Advance Directive? No No No Yes No  Type of Aeronautical engineer of [place];Living will   Copy of Healthcare Power of Attorney in Chart?    No - copy requested   Would patient like information on creating a medical advance directive?  No - Patient declined No - Patient declined  No - patient declined information    Current Medications (verified) Outpatient Encounter Medications as of 10/13/2023  Medication Sig   amLODipine  (NORVASC ) 10 MG tablet Take 1 tablet (10 mg total) by mouth daily. For blood pressure   aspirin EC 81 MG tablet Take 81 mg by mouth daily.   Cholecalciferol (VITAMIN D3) 1.25 MG (50000 UT) TABS Take 1 tablet by mouth once a week.   hydrALAZINE  (APRESOLINE ) 25 MG tablet Take 1 tablet (25 mg total) by mouth in the morning and at bedtime.   lisinopril  (ZESTRIL ) 20 MG tablet Take 1 tablet (20 mg total) by mouth daily. For blood pressure   polyethylene glycol powder (GLYCOLAX /MIRALAX ) powder Take 17 g by mouth daily as needed for moderate constipation.   pravastatin  (PRAVACHOL ) 80 MG tablet  Take 1 tablet (80 mg total) by mouth daily. For cholesterol   tiZANidine  (ZANAFLEX ) 2 MG tablet TAKE 1 TABLET BY MOUTH TWICE DAILY AS NEEDED FOR MUSCLE SPASMS, MAY CAUSE DROWSINESS   No facility-administered encounter medications on file as of 10/13/2023.    Allergies (verified) Patient has no known allergies.   History: Past Medical History:  Diagnosis Date   Carotid stenosis 08/2014   1-39% bilateral, rpt US  2 yrs   Diet-controlled type 2 diabetes mellitus (HCC)    DSME 02/2013   History of seizures as a child remote   Hyperlipidemia    Hypertension    Past Surgical History:  Procedure Laterality Date   CATARACT EXTRACTION Right 2013   COLONOSCOPY  08/2009   large int hemorrhoids, benign lymphoid polyp, rpt due 10 yrs Nickey Barn)   DOBUTAMINE  STRESS ECHO  2016   WNL Jacquelynn Matter)   Family History  Problem Relation Age of Onset   Hypertension Mother    Hypertension Father    Cancer Sister        lung   Cancer Sister        lung   Coronary artery disease Neg Hx    Stroke Neg Hx    Diabetes Neg Hx    Kidney disease Neg Hx    Hyperlipidemia Neg Hx    Social History   Socioeconomic History   Marital  status: Married    Spouse name: Not on file   Number of children: 7   Years of education: 1th grade   Highest education level: Not on file  Occupational History   Occupation: retired    Associate Professor: Retired    Comment: used to work at [employer], [employer]  Tobacco Use   Smoking status: Former    Current packs/day: 0.00    Types: Cigarettes    Quit date: 06/14/1985    Years since quitting: 38.3    Passive exposure: Past   Smokeless tobacco: Never  Vaping Use   Vaping status: Never Used  Substance and Sexual Activity   Alcohol use: No    Comment: Quit 1987   Drug use: No   Sexual activity: Not Currently  Other Topics Concern   Not on file  Social History Narrative   Caffeine: 1 cup coffee.   Lives with wife, step-daughter (29yo), no pets   Occupation: retired   Activity: walks  daily   Diet: good water, fruits/vegetables daily   Social Drivers of Corporate investment banker Strain: Low Risk  (10/13/2023)   Overall Financial Resource Strain (CARDIA)    Difficulty of Paying Living Expenses: Not hard at all  Food Insecurity: No Food Insecurity (10/13/2023)   Hunger Vital Sign    Worried About Running Out of Food in the Last Year: Never true    Ran Out of Food in the Last Year: Never true  Transportation Needs: No Transportation Needs (10/13/2023)   PRAPARE - Administrator, Civil Service (Medical): No    Lack of Transportation (Non-Medical): No  Physical Activity: Insufficiently Active (10/13/2023)   Exercise Vital Sign    Days of Exercise per Week: 3 days    Minutes of Exercise per Session: 30 min  Stress: No Stress Concern Present (10/13/2023)   [employer] of Occupational Health - Occupational Stress Questionnaire    Feeling of Stress : Not at all  Social Connections: Moderately Integrated (10/13/2023)   Social Connection and Isolation Panel [NHANES]    Frequency of Communication with Friends and Family: More than three times a week    Frequency of Social Gatherings with Friends and Family: More than three times a week    Attends Religious Services: More than 4 times per year    Active Member of [employer] or Organizations: No    Attends Engineer, structural: Never    Marital Status: Married    Tobacco Counseling Counseling given: Not Answered    Clinical Intake:  Pre-visit preparation completed: Yes  Pain : No/denies pain     BMI - recorded: 30.62 Nutritional Status: BMI > 30  Obese Nutritional Risks: None Diabetes: Yes CBG done?: No Did pt. bring in CBG monitor from home?: No  Lab Results  Component Value Date   HGBA1C 6.3 10/07/2023   HGBA1C 5.9 (A) 04/19/2023   HGBA1C 6.3 10/13/2022     How often do you need to have someone help you when you read instructions, pamphlets, or other written materials from your doctor or  pharmacy?: 1 - Never  Interpreter Needed?: No  Comments: lives with wife Information entered by :: B.Marshawn Normoyle,LPN   Activities of Daily Living      10/13/2023    3:33 PM  In your present state of health, do you have any difficulty performing the following activities:  Hearing? 0  Vision? 0  Difficulty concentrating or making decisions? 0  Walking or climbing stairs? 0  Dressing or bathing? 0  Doing errands, shopping? 0  Preparing Food and eating ? N  Using the Toilet? N  In the past six months, have you accidently leaked urine? N  Do you have problems with loss of bowel control? N  Managing your Medications? N  Managing your Finances? N  Housekeeping or managing your Housekeeping? N    Patient Care Team: Claire Crick, MD as PCP - General (Family Medicine) Alta Ast, [facility] (Inactive) as Pharmacist (Pharmacist) Sidra Dredge, MD as Consulting Physician (Ophthalmology)  Indicate any recent Medical Services you may have received from other than [facility] providers in the past year (date may be approximate).     Assessment:   This is a routine wellness examination for Jeffrey Villarreal.  Hearing/Vision screen Hearing Screening - Comments:: Pt says hearing is alright Vision Screening - Comments:: Pt says vision is good w/glasses Dr Diedre Fox   Goals Addressed             This Visit's Progress    Patient Stated   On track    10/13/23 I will continue to take medications as prescribed.      Patient Stated   On track    10/13/23- I will continue to do Silver Sneakers at the gym 1 day a week for about 1 hour.      Patient Stated   On track    10/13/23-Would like to maintain current goal      Patient Stated   On track    10/13/23-Like to go fishing.       Depression Screen     10/13/2023    3:08 PM 10/07/2023    9:33 AM 09/02/2023   11:27 AM 07/22/2023   10:09 AM 04/19/2023   11:07 AM 08/02/2022    2:30 PM 07/30/2021    1:32 PM  PHQ 2/9 Scores  PHQ - 2 Score 0 0 0 0  0 0 0    Fall Risk     10/13/2023    3:06 PM 10/07/2023    9:33 AM 10/07/2023    8:12 AM 04/19/2023   11:07 AM 08/02/2022    2:26 PM  Fall Risk   Falls in the past year? 0 0 0 0 0  Number falls in past yr: 0    0  Injury with Fall? 0    0  Risk for fall due to : No Fall Risks    No Fall Risks  Follow up Education provided;Falls prevention discussed    Falls prevention discussed;Falls evaluation completed    MEDICARE RISK AT HOME:  Medicare Risk at Home Any stairs in or around the home?: Yes If so, are there any without handrails?: Yes Home free of loose throw rugs in walkways, pet beds, electrical cords, etc?: Yes Adequate lighting in your home to reduce risk of falls?: Yes Life alert?: No Use of a cane, walker or w/c?: No Grab bars in the bathroom?: Yes Shower chair or bench in shower?: No Elevated toilet seat or a handicapped toilet?: No  TIMED UP AND GO:  Was the test performed?  Yes  Length of time to ambulate 10 feet: 12 sec Gait steady and fast without use of assistive device  Cognitive Function: 6CIT completed    07/29/2020    1:17 PM 11/22/2018    6:07 PM  MMSE - Mini Mental State Exam  Not completed: Refused   Orientation to time  5  Orientation to Place  5  Registration  3  Attention/ Calculation  0  Recall  3  Language- name 2 objects  0  Language- repeat  1  Language- follow 3 step command  0  Language- read & follow direction  0  Write a sentence  0  Copy design  0  Total score  17        10/13/2023    3:35 PM 08/02/2022    2:42 PM  6CIT Screen  What Year? 0 points 0 points  What month? 0 points 0 points  What time? 0 points 0 points  Count back from 20 0 points 0 points  Months in reverse 0 points 0 points  Repeat phrase 0 points 2 points  Total Score 0 points 2 points    Immunizations Immunization History  Administered Date(s) Administered   Fluad Trivalent(High Dose 65+) 04/19/2023   PFIZER(Purple Top)SARS-COV-2 Vaccination 09/15/2019,  10/10/2019, 05/21/2020   PNEUMOCOCCAL CONJUGATE-20 10/13/2022   Pneumococcal Polysaccharide-23 08/05/2020   Td 06/15/2007    Screening Tests Health Maintenance  Topic Date Due   Zoster Vaccines- Shingrix (1 of 2) Never done   DTaP/Tdap/Td (2 - Tdap) 06/14/2017   OPHTHALMOLOGY EXAM  11/26/2022   COVID-19 Vaccine (4 - 2024-25 season) 02/13/2023   Diabetic kidney evaluation - Urine ACR  10/13/2023   INFLUENZA VACCINE  01/13/2024   HEMOGLOBIN A1C  04/07/2024   FOOT EXAM  04/18/2024   Diabetic kidney evaluation - eGFR measurement  10/06/2024   Medicare Annual Wellness (AWV)  10/12/2024   Pneumonia Vaccine 74+ Years old  Completed   HPV VACCINES  Aged Out   Meningococcal B Vaccine  Aged Out   Hepatitis C Screening  Discontinued    Health Maintenance  Health Maintenance Due  Topic Date Due   Zoster Vaccines- Shingrix (1 of 2) Never done   DTaP/Tdap/Td (2 - Tdap) 06/14/2017   OPHTHALMOLOGY EXAM  11/26/2022   COVID-19 Vaccine (4 - 2024-25 season) 02/13/2023   Diabetic kidney evaluation - Urine ACR  10/13/2023   Health Maintenance Items Addressed: None needed  Additional Screening:  Vision Screening: Recommended annual ophthalmology exams for early detection of glaucoma and other disorders of the eye.  Dental Screening: Recommended annual dental exams for proper oral hygiene  Community Resource Referral / Chronic Care Management: CRR required this visit?  No   CCM required this visit?  Appt scheduled with PCP     Plan:     I have personally reviewed and noted the following in the patient's chart:   Medical and social history Use of alcohol, tobacco or illicit drugs  Current medications and supplements including opioid prescriptions. Patient is not currently taking opioid prescriptions. Functional ability and status Nutritional status Physical activity Advanced directives List of other physicians Hospitalizations, surgeries, and ER visits in previous 12  months Vitals Screenings to include cognitive, depression, and falls Referrals and appointments  In addition, I have reviewed and discussed with patient certain preventive protocols, quality metrics, and best practice recommendations. A written personalized care plan for preventive services as well as general preventive health recommendations were provided to patient.     Nerissa Bannister, LPN   06/17/863   After Visit Summary: (MyChart) Due to this being a telephonic visit, the after visit summary with patients personalized plan was offered to patient via MyChart   Notes: Nothing significant to report at this time.

## 2023-10-20 ENCOUNTER — Ambulatory Visit: Admitting: Podiatry

## 2023-10-27 ENCOUNTER — Ambulatory Visit: Payer: Medicare Other

## 2023-10-27 DIAGNOSIS — E538 Deficiency of other specified B group vitamins: Secondary | ICD-10-CM

## 2023-10-27 MED ORDER — CYANOCOBALAMIN 1000 MCG/ML IJ SOLN
1000.0000 ug | Freq: Once | INTRAMUSCULAR | Status: AC
  Administered 2023-10-27: 1000 ug via INTRAMUSCULAR

## 2023-10-27 NOTE — Progress Notes (Signed)
 Per orders of Dr. Claire Crick, who is out of office and Dr Richrd Char who is in office, injection of vitamin b 12 given by Claretha Crocker in right deltoid.(pt request to get in right deltoid) Patient tolerated injection well. Patient will make appointment for 1 month.

## 2023-11-10 ENCOUNTER — Other Ambulatory Visit: Payer: Self-pay | Admitting: Family Medicine

## 2023-11-10 DIAGNOSIS — I10 Essential (primary) hypertension: Secondary | ICD-10-CM

## 2023-11-10 DIAGNOSIS — E785 Hyperlipidemia, unspecified: Secondary | ICD-10-CM

## 2023-11-10 NOTE — Telephone Encounter (Signed)
 Per 10/07/23 OV notes, atenolol  was stopped and changed to hydralazine  25 mg BID. Rx sent 10/07/23, #60/3 refills to [facility].  Atenolol  request denied.

## 2023-11-21 ENCOUNTER — Ambulatory Visit: Admitting: Podiatry

## 2023-11-25 ENCOUNTER — Encounter: Payer: Self-pay | Admitting: Podiatry

## 2023-11-25 ENCOUNTER — Ambulatory Visit (INDEPENDENT_AMBULATORY_CARE_PROVIDER_SITE_OTHER): Admitting: Podiatry

## 2023-11-25 DIAGNOSIS — E1169 Type 2 diabetes mellitus with other specified complication: Secondary | ICD-10-CM | POA: Diagnosis not present

## 2023-11-25 DIAGNOSIS — M79675 Pain in left toe(s): Secondary | ICD-10-CM

## 2023-11-25 DIAGNOSIS — M79674 Pain in right toe(s): Secondary | ICD-10-CM | POA: Diagnosis not present

## 2023-11-25 DIAGNOSIS — N289 Disorder of kidney and ureter, unspecified: Secondary | ICD-10-CM

## 2023-11-25 DIAGNOSIS — B351 Tinea unguium: Secondary | ICD-10-CM

## 2023-11-25 NOTE — Progress Notes (Signed)
 This patient returns to my office for at risk foot care.  This patient requires this care by a professional since this patient will be at risk due to having diabetes  and RI.   Patient denies diabetes. This patient is unable to cut nails himself since the patient cannot reach his nails.These nails are painful walking and wearing shoes.   Patient has not been seen in over 6 months. This patient presents for at risk foot care today.  General Appearance  Alert, conversant and in no acute stress.  Vascular  Dorsalis pedis and posterior tibial  pulses are weakly  palpable  bilaterally.  Capillary return is within normal limits  bilaterally. Temperature is within normal limits  bilaterally.  Neurologic  Senn-Weinstein monofilament wire test within normal limits/diminished   bilaterally. Muscle power within normal limits bilaterally.  Nails Thick disfigured discolored nails with subungual debris  from hallux to fifth toes bilaterally. No evidence of bacterial infection or drainage bilaterally.  Orthopedic  No limitations of motion  feet .  No crepitus or effusions noted.  No bony pathology or digital deformities noted.  Skin  normotropic skin with no porokeratosis noted bilaterally.  No signs of infections or ulcers noted.     Onychomycosis  Pain in right toes  Pain in left toes  Consent was obtained for treatment procedures.   Mechanical debridement of nails 1-5  bilaterally performed with a nail nipper.  Filed with dremel without incident.    Return office visit    10 weeks                 Told patient to return for periodic foot care and evaluation due to potential at risk complications.   Helane Gunther DPM

## 2023-11-29 ENCOUNTER — Ambulatory Visit (HOSPITAL_COMMUNITY)
Admission: RE | Admit: 2023-11-29 | Discharge: 2023-11-29 | Disposition: A | Discharge status: Home / Self Care | Source: Ambulatory Visit | Attending: Cardiovascular Disease | Admitting: Cardiovascular Disease

## 2023-11-29 DIAGNOSIS — R011 Cardiac murmur, unspecified: Secondary | ICD-10-CM

## 2023-11-29 DIAGNOSIS — R079 Chest pain, unspecified: Secondary | ICD-10-CM | POA: Diagnosis not present

## 2023-11-29 DIAGNOSIS — I10 Essential (primary) hypertension: Secondary | ICD-10-CM

## 2023-11-29 LAB — ECHOCARDIOGRAM COMPLETE
AR max vel: 1.34 cm2
AV Area VTI: 1.37 cm2
AV Area mean vel: 1.32 cm2
AV Mean grad: 11.3 mmHg
AV Peak grad: 20.6 mmHg
Ao pk vel: 2.27 m/s
Area-P 1/2: 3.29 cm2
S' Lateral: 2.6 cm

## 2023-12-01 ENCOUNTER — Ambulatory Visit (INDEPENDENT_AMBULATORY_CARE_PROVIDER_SITE_OTHER): Payer: Medicare Other

## 2023-12-01 DIAGNOSIS — E538 Deficiency of other specified B group vitamins: Secondary | ICD-10-CM

## 2023-12-01 MED ORDER — CYANOCOBALAMIN 1000 MCG/ML IJ SOLN: 1000.0000 ug | Freq: Once | INTRAMUSCULAR | Status: AC

## 2023-12-01 NOTE — Progress Notes (Signed)
 Per orders of Dr. Curt Dover, injection of B-12 given by Kris Pester in left deltoid. Patient tolerated injection well.

## 2023-12-06 ENCOUNTER — Ambulatory Visit: Payer: Self-pay | Admitting: Family Medicine

## 2023-12-06 DIAGNOSIS — I350 Nonrheumatic aortic (valve) stenosis: Secondary | ICD-10-CM

## 2024-01-05 ENCOUNTER — Ambulatory Visit (INDEPENDENT_AMBULATORY_CARE_PROVIDER_SITE_OTHER): Payer: Medicare Other

## 2024-01-05 DIAGNOSIS — E538 Deficiency of other specified B group vitamins: Secondary | ICD-10-CM | POA: Diagnosis not present

## 2024-01-05 MED ORDER — CYANOCOBALAMIN 1000 MCG/ML IJ SOLN
1000.0000 ug | Freq: Once | INTRAMUSCULAR | Status: AC
  Administered 2024-01-05: 1000 ug via INTRAMUSCULAR

## 2024-01-05 NOTE — Progress Notes (Signed)
 After obtaining consent, and per orders of Dr. Watt, injection of b12 given IM in right deltoid by Sebastian Rosina Helling. Patient tolerated injection well.   Nurse visit sent to Dr. Watt due to Dr. Rilla being out fo the office today.

## 2024-01-09 ENCOUNTER — Encounter: Payer: Self-pay | Admitting: Family Medicine

## 2024-01-09 ENCOUNTER — Ambulatory Visit (INDEPENDENT_AMBULATORY_CARE_PROVIDER_SITE_OTHER): Admitting: Family Medicine

## 2024-01-09 VITALS — BP 136/70 | HR 83 | Temp 98.7°F | Ht 64.5 in | Wt 179.5 lb

## 2024-01-09 DIAGNOSIS — I350 Nonrheumatic aortic (valve) stenosis: Secondary | ICD-10-CM | POA: Diagnosis not present

## 2024-01-09 DIAGNOSIS — Z7189 Other specified counseling: Secondary | ICD-10-CM

## 2024-01-09 DIAGNOSIS — E538 Deficiency of other specified B group vitamins: Secondary | ICD-10-CM

## 2024-01-09 DIAGNOSIS — E66811 Obesity, class 1: Secondary | ICD-10-CM

## 2024-01-09 DIAGNOSIS — Z0000 Encounter for general adult medical examination without abnormal findings: Secondary | ICD-10-CM

## 2024-01-09 DIAGNOSIS — E559 Vitamin D deficiency, unspecified: Secondary | ICD-10-CM

## 2024-01-09 DIAGNOSIS — E785 Hyperlipidemia, unspecified: Secondary | ICD-10-CM | POA: Diagnosis not present

## 2024-01-09 DIAGNOSIS — I10 Essential (primary) hypertension: Secondary | ICD-10-CM | POA: Diagnosis not present

## 2024-01-09 DIAGNOSIS — E1169 Type 2 diabetes mellitus with other specified complication: Secondary | ICD-10-CM | POA: Diagnosis not present

## 2024-01-09 DIAGNOSIS — N289 Disorder of kidney and ureter, unspecified: Secondary | ICD-10-CM

## 2024-01-09 DIAGNOSIS — H5702 Anisocoria: Secondary | ICD-10-CM

## 2024-01-09 DIAGNOSIS — I6523 Occlusion and stenosis of bilateral carotid arteries: Secondary | ICD-10-CM | POA: Diagnosis not present

## 2024-01-09 DIAGNOSIS — D510 Vitamin B12 deficiency anemia due to intrinsic factor deficiency: Secondary | ICD-10-CM

## 2024-01-09 LAB — MICROALBUMIN / CREATININE URINE RATIO
Creatinine,U: 166.6 mg/dL
Microalb Creat Ratio: 5.1 mg/g (ref 0.0–30.0)
Microalb, Ur: 0.8 mg/dL (ref 0.0–1.9)

## 2024-01-09 MED ORDER — AMLODIPINE BESYLATE 10 MG PO TABS: 10.0000 mg | ORAL_TABLET | Freq: Every day | ORAL | 3 refills | Status: AC

## 2024-01-09 MED ORDER — HYDRALAZINE HCL 25 MG PO TABS: 25.0000 mg | ORAL_TABLET | Freq: Two times a day (BID) | ORAL | 3 refills | Status: AC

## 2024-01-09 MED ORDER — PRAVASTATIN SODIUM 80 MG PO TABS: 80.0000 mg | ORAL_TABLET | Freq: Every day | ORAL | 3 refills | Status: AC

## 2024-01-09 MED ORDER — VITAMIN D (ERGOCALCIFEROL) 1.25 MG (50000 UNIT) PO CAPS: 50000.0000 [IU] | ORAL_CAPSULE | ORAL | 3 refills | Status: AC

## 2024-01-09 MED ORDER — LISINOPRIL 20 MG PO TABS
20.0000 mg | ORAL_TABLET | Freq: Every day | ORAL | 3 refills | Status: DC
Start: 2024-01-09 — End: 2024-04-19

## 2024-01-09 NOTE — Progress Notes (Signed)
 Ph: [phone] Fax: [phone]   Patient ID: Jeffrey Villarreal, male    DOB: 1941/03/25, 83 y.o.   MRN: [id number]  This visit was conducted in person.  BP 136/70   Pulse 83   Temp 98.7 F (37.1 C) (Oral)   Ht 5' 4.5 (1.638 m)   Wt 179 lb 8 oz (81.4 kg)   SpO2 98%   BMI 30.34 kg/m    CC: CPE Subjective:   HPI: Jeffrey Villarreal is a 84 y.o. male presenting on 01/09/2024 for Annual Exam   Saw health advisor 10/2023 for medicare wellness visit. Note reviewed.   No results found.  Flowsheet Row Office Visit from 01/09/2024 in [facility] [place]  PHQ-2 Total Score 0       10/13/2023    3:06 PM 10/07/2023    9:33 AM 10/07/2023    8:12 AM 04/19/2023   11:07 AM 08/02/2022    2:26 PM  Fall Risk   Falls in the past year? 0 0 0 0 0  Number falls in past yr: 0    0  Injury with Fall? 0    0  Risk for fall due to : No Fall Risks    No Fall Risks  Follow up Education provided;Falls prevention discussed    Falls prevention discussed;Falls evaluation completed    B12 def with positive IF Ab - pernicious anemia. Continues vit B12 shots monthly, has 2 shots left.  Lab Results  Component Value Date   VITAMINB12 526 10/07/2023    Sister has severe arthritis - non-ambulatory due to this.  He's noted bilateral shoulder and hip pain - manages with tylenol arthritis with limited benefit.   Preventative: Colonoscopy 08/2009, large int hemorrhoids, benign lymphoid polyp, rpt due 10 yrs Verlee). Denies BM changes or blood in stool. iFOB normal 07/2021. Age out.  Prostate - aged out. No previous measures. Nocturia x2  Lung cancer screening - not eligible  Never taken flu shot - declines  COVID vaccine Pfizer 09/2019 x2, 05/2020 booster Tetanus - unsure, thinks ~2009  Pneumovax-23 07/2020, prevnar-20 10/2022 Shingrix - discussed, declines Advanced directives - has not completed. Would want wife to be HCPOA. Has packet at home. reviewed desires. Would be ok with  CPR/compressions, shock, wouldn't want prolonged life support if terminal condition.  Seat belt use discussed  Sunscreen use discussed. No changing moles on skin  Ex smoker - quit 1987  Alcohol - none - quit 1987  Dentist - does not see - has full dentures  Eye exam - sees Groat yearly  Bowel - occasional constipation managed with prn miralax   Bladder - no incontinence    Caffeine: 1 cup coffee.   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 Interim medical history since our last visit reviewed. Allergies and medications reviewed and updated. Outpatient Medications Prior to Visit  Medication Sig Dispense Refill   aspirin EC 81 MG tablet Take 81 mg by mouth daily.     polyethylene glycol powder (GLYCOLAX /MIRALAX ) powder Take 17 g by mouth daily as needed for moderate constipation. 850 g 1   amLODipine  (NORVASC ) 10 MG tablet TAKE 1 TABLET(10 MG) BY MOUTH DAILY FOR BLOOD PRESSURE  90 tablet 0   Cholecalciferol (VITAMIN D3) 1.25 MG (50000 UT) TABS Take 1 tablet by mouth once a week. 12 tablet 3   hydrALAZINE  (APRESOLINE ) 25 MG tablet Take 1 tablet (25 mg total) by mouth in the morning and at bedtime. 60 tablet 3   lisinopril  (ZESTRIL ) 20 MG tablet TAKE 1 TABLET(20 MG) BY MOUTH DAILY FOR BLOOD PRESSURE 90 tablet 0   pravastatin  (PRAVACHOL ) 80 MG tablet TAKE 1 TABLET(80 MG) BY MOUTH DAILY FOR CHOLESTEROL 90 tablet 0   tiZANidine  (ZANAFLEX ) 2 MG tablet TAKE 1 TABLET BY MOUTH TWICE DAILY AS NEEDED FOR MUSCLE SPASMS, MAY CAUSE DROWSINESS 30 tablet 0   No facility-administered medications prior to visit.     Per HPI unless specifically indicated in ROS section below Review of Systems   Constitutional:  Negative for activity change, appetite change, chills, fatigue, fever and unexpected weight change.  HENT:  Negative for hearing loss.   Eyes:  Negative for visual disturbance.  Respiratory:  Negative for cough, chest tightness, shortness of breath and wheezing.   Cardiovascular:  Negative for chest pain, palpitations and leg swelling.  Gastrointestinal:  Positive for constipation (prn miralax , MOM). Negative for abdominal distention, abdominal pain, blood in stool, diarrhea, nausea and vomiting.  Genitourinary:  Negative for difficulty urinating and hematuria.  Musculoskeletal:  Negative for arthralgias, myalgias and neck pain.  Skin:  Negative for rash.  Neurological:  Negative for dizziness, seizures, syncope and headaches.  Hematological:  Negative for adenopathy. Does not bruise/bleed easily.  Psychiatric/Behavioral:  Negative for dysphoric mood. The patient is not nervous/anxious.     Objective:  BP 136/70   Pulse 83   Temp 98.7 F (37.1 C) (Oral)   Ht 5' 4.5 (1.638 m)   Wt 179 lb 8 oz (81.4 kg)   SpO2 98%   BMI 30.34 kg/m   Wt Readings from Last 3 Encounters:  01/09/24 179 lb 8 oz (81.4 kg)  10/13/23 181 lb 3.2 oz (82.2 kg)  10/07/23 182 lb 4 oz (82.7 kg)      Physical Exam Vitals and nursing note reviewed.  Constitutional:      General: He is not in acute distress.    Appearance: Normal appearance. He is well-developed. He is not ill-appearing.  HENT:     Head: Normocephalic and atraumatic.     Right Ear: Hearing, tympanic membrane, ear canal and external ear normal.     Left Ear: Hearing, tympanic membrane, ear canal and external ear normal.     Mouth/Throat:     Mouth: Mucous membranes are moist.     Pharynx: Oropharynx is clear. No oropharyngeal exudate or posterior oropharyngeal erythema.  Eyes:     General: No scleral icterus.    Extraocular Movements: Extraocular movements intact.     Conjunctiva/sclera: Conjunctivae normal.     Pupils:  Pupils are equal, round, and reactive to light.  Neck:     Thyroid : No thyroid  mass or thyromegaly.     Vascular: Carotid bruit (referred from heart) present.  Cardiovascular:     Rate and Rhythm: Normal rate and regular rhythm.     Pulses: Normal pulses.          Radial pulses are 2+ on the right side and 2+ on the left side.     Heart sounds: Murmur (3/6 systolic USB) heard.  Pulmonary:     Effort: Pulmonary effort is normal. No respiratory distress.     Breath sounds: Normal breath sounds. No wheezing, rhonchi  or rales.  Abdominal:     General: Bowel sounds are normal. There is no distension.     Palpations: Abdomen is soft. There is no mass.     Tenderness: There is no abdominal tenderness. There is no guarding or rebound.     Hernia: No hernia is present.  Musculoskeletal:        General: Normal range of motion.     Cervical back: Normal range of motion and neck supple.     Right lower leg: No edema.     Left lower leg: No edema.  Lymphadenopathy:     Cervical: No cervical adenopathy.  Skin:    General: Skin is warm and dry.     Findings: No rash.  Neurological:     General: No focal deficit present.     Mental Status: He is alert and oriented to person, place, and time.  Psychiatric:        Mood and Affect: Mood normal.        Behavior: Behavior normal.        Thought Content: Thought content normal.        Judgment: Judgment normal.       Results for orders placed or performed during the hospital encounter of 11/29/23  ECHOCARDIOGRAM COMPLETE   Collection Time: 11/29/23  8:53 AM  Result Value Ref Range   Area-P 1/2 3.29 cm2   S' Lateral 2.60 cm   AV Area mean vel 1.32 cm2   AR max vel 1.34 cm2   AV Area VTI 1.37 cm2   Ao pk vel 2.27 m/s   AV Mean grad 11.3 mmHg   AV Peak grad 20.6 mmHg   Est EF 70 - 75%    Lab Results  Component Value Date   HGBA1C 6.3 10/07/2023    Lab Results  Component Value Date   CHOL 109 10/07/2023   HDL 42.40 10/07/2023    LDLCALC 55 10/07/2023   LDLDIRECT 86.0 02/20/2015   TRIG 60.0 10/07/2023   CHOLHDL 3 10/07/2023    Lab Results  Component Value Date   NA 141 10/07/2023   CL 104 10/07/2023   K 3.9 10/07/2023   CO2 30 10/07/2023   BUN 17 10/07/2023   CREATININE 1.27 10/07/2023   GFR 52.46 (L) 10/07/2023   CALCIUM  9.3 10/07/2023   PHOS 2.9 02/03/2021   ALBUMIN 4.3 10/13/2022   GLUCOSE 103 (H) 10/07/2023    Lab Results  Component Value Date   ALT 19 10/13/2022   AST 26 10/13/2022   ALKPHOS 90 10/13/2022   BILITOT 0.5 10/13/2022    Lab Results  Component Value Date   WBC 5.1 10/07/2023   HGB 14.2 10/07/2023   HCT 42.3 10/07/2023   MCV 84.7 10/07/2023   PLT 279.0 10/07/2023    Lab Results  Component Value Date   MICROALBUR 0.8 08/16/2014   Echocardiogram 11/2023: LVEF 70-75%, hyperdynamic, no RWMA, mod concentric LVH with G1DD, normal RV function, trivial MR without stenosis, tricuspid but calcified AV with mild-mod AS, technically difficult study   Assessment & Plan:   Problem List Items Addressed This Visit     Health maintenance examination - Primary (Chronic)   Preventative protocols reviewed and updated unless pt declined. Discussed healthy diet and lifestyle.       Advanced care planning/counseling discussion (Chronic)   Previously discussed      Hypertension   Chronic, stable. Continue current regimen.       Relevant Medications  amLODipine  (NORVASC ) 10 MG tablet   lisinopril  (ZESTRIL ) 20 MG tablet   pravastatin  (PRAVACHOL ) 80 MG tablet   hydrALAZINE  (APRESOLINE ) 25 MG tablet   Type 2 diabetes mellitus with other specified complication (HCC)   Chronic, remaining well controlled off antihyperglycemic medication      Relevant Medications   lisinopril  (ZESTRIL ) 20 MG tablet   pravastatin  (PRAVACHOL ) 80 MG tablet   Hyperlipidemia associated with type 2 diabetes mellitus (HCC)   Chronic, continue high dose pravastatin .  The ASCVD Risk score (Arnett DK, et al.,  2019) failed to calculate for the following reasons:   The 2019 ASCVD risk score is only valid for ages 49 to 56       Relevant Medications   amLODipine  (NORVASC ) 10 MG tablet   lisinopril  (ZESTRIL ) 20 MG tablet   pravastatin  (PRAVACHOL ) 80 MG tablet   hydrALAZINE  (APRESOLINE ) 25 MG tablet   Other Relevant Orders   Microalbumin/Creatinine Ratio, Urine   Obesity, Class I, BMI 30-34.9   Encourage healthy diet and lifestyle choices to affect sustainable weight loss.       Carotid stenosis   Mild on latest carotid US  10/2022 - anticipate referred from known AS      Relevant Medications   amLODipine  (NORVASC ) 10 MG tablet   lisinopril  (ZESTRIL ) 20 MG tablet   pravastatin  (PRAVACHOL ) 80 MG tablet   hydrALAZINE  (APRESOLINE ) 25 MG tablet   Anisocoria   Vitamin B12 deficiency   Continue monthly B12 shots for 6 months then reassess control. IF Ab positive 09/2023.       Renal insufficiency   Recent GFR stable 50s - continue to monitor.  Update Umicroalb      Aortic stenosis   Pending cardiology f/u - # provided to schedule appt       Relevant Medications   amLODipine  (NORVASC ) 10 MG tablet   lisinopril  (ZESTRIL ) 20 MG tablet   pravastatin  (PRAVACHOL ) 80 MG tablet   hydrALAZINE  (APRESOLINE ) 25 MG tablet   Vitamin D  deficiency   Continue weekly replacement - refilled today.       Pernicious anemia   IFAb positive 09/2023.  No previous h/o anemia.  Currently continues monthly b12 shots        Meds ordered this encounter  Medications   amLODipine  (NORVASC ) 10 MG tablet    Sig: Take 1 tablet (10 mg total) by mouth daily.    Dispense:  90 tablet    Refill:  3   lisinopril  (ZESTRIL ) 20 MG tablet    Sig: Take 1 tablet (20 mg total) by mouth daily.    Dispense:  90 tablet    Refill:  3   pravastatin  (PRAVACHOL ) 80 MG tablet    Sig: Take 1 tablet (80 mg total) by mouth daily.    Dispense:  90 tablet    Refill:  3   hydrALAZINE  (APRESOLINE ) 25 MG tablet    Sig: Take 1  tablet (25 mg total) by mouth in the morning and at bedtime.    Dispense:  180 tablet    Refill:  3   Vitamin D , Ergocalciferol , (DRISDOL ) 1.25 MG (50000 UNIT) CAPS capsule    Sig: Take 1 capsule (50,000 Units total) by mouth once a week.    Dispense:  12 capsule    Refill:  3    Orders Placed This Encounter  Procedures   Microalbumin/Creatinine Ratio, Urine    Patient Instructions  Urine test today.  Call Phone: [phone] heart doctor's office in [place] to  schedule appointment to follow aortic valve stenosis.  Good to see you today Return in 4 months for diabetes and follow up visit.   Follow up plan: Return in about 4 months (around 05/11/2024) for follow up visit.  Anton Blas, MD

## 2024-01-09 NOTE — Assessment & Plan Note (Signed)
 Chronic, remaining well controlled off antihyperglycemic medication

## 2024-01-09 NOTE — Patient Instructions (Addendum)
 Urine test today.  Call Phone: [phone] heart doctor's office in [place] to schedule appointment to follow aortic valve stenosis.  Good to see you today Return in 4 months for diabetes and follow up visit.

## 2024-01-09 NOTE — Assessment & Plan Note (Signed)
 Continue weekly replacement - refilled today.

## 2024-01-09 NOTE — Assessment & Plan Note (Signed)
 Pending cardiology f/u - # provided to schedule appt

## 2024-01-09 NOTE — Assessment & Plan Note (Signed)
 Chronic, stable. Continue current regimen.

## 2024-01-09 NOTE — Assessment & Plan Note (Signed)
 Chronic, continue high dose pravastatin .  The ASCVD Risk score (Arnett DK, et al., 2019) failed to calculate for the following reasons:   The 2019 ASCVD risk score is only valid for ages 8 to 43

## 2024-01-09 NOTE — Assessment & Plan Note (Signed)
 Mild on latest carotid US  10/2022 - anticipate referred from known AS

## 2024-01-09 NOTE — Assessment & Plan Note (Signed)
 Encourage healthy diet and lifestyle choices to affect sustainable weight loss

## 2024-01-09 NOTE — Assessment & Plan Note (Addendum)
 IFAb positive 09/2023.  No previous h/o anemia.  Currently continues monthly b12 shots

## 2024-01-09 NOTE — Assessment & Plan Note (Signed)
 Preventative protocols reviewed and updated unless pt declined. Discussed healthy diet and lifestyle.

## 2024-01-09 NOTE — Assessment & Plan Note (Addendum)
 Recent GFR stable 50s - continue to monitor.  Update Umicroalb

## 2024-01-09 NOTE — Assessment & Plan Note (Signed)
 Continue monthly B12 shots for 6 months then reassess control. IF Ab positive 09/2023.

## 2024-01-09 NOTE — Assessment & Plan Note (Signed)
 Previously discussed.

## 2024-01-12 ENCOUNTER — Ambulatory Visit: Payer: Self-pay | Admitting: Family Medicine

## 2024-01-31 ENCOUNTER — Encounter: Payer: Self-pay | Admitting: *Deleted

## 2024-02-09 ENCOUNTER — Ambulatory Visit: Payer: Medicare Other

## 2024-02-09 ENCOUNTER — Telehealth: Payer: Self-pay

## 2024-02-09 DIAGNOSIS — E538 Deficiency of other specified B group vitamins: Secondary | ICD-10-CM

## 2024-02-09 MED ORDER — CYANOCOBALAMIN 1000 MCG/ML IJ SOLN
1000.0000 ug | Freq: Once | INTRAMUSCULAR | Status: AC
  Administered 2024-02-09: 1000 ug via INTRAMUSCULAR

## 2024-02-09 NOTE — Telephone Encounter (Signed)
Noted. Agree. Thanks.

## 2024-02-09 NOTE — Progress Notes (Signed)
 Per orders of Dr Anton Blas who is out of office and Dr. Arlyss Solian, who is in office today injection of vitamin b 12 given by Laray Arenas in left deltoid. Patient tolerated injection well. Patient will make appointment for 1 month.

## 2024-02-09 NOTE — Telephone Encounter (Signed)
 Pt came to [facility] for [place] and while here pt said for couple of days he had developed prod cough with clear phlegm and on and off loose diarrhea. No fever, no SOB or CP. Pt last diarrhea was 02/08/24. No available appts at [facility] or [facility]. Offered pt appt at [facility] on 02/10/24 and pt said he will go to UC today. UC & ED precautions given and pt voiced understanding. Sending note to Dr Rilla who is out of office and Dr Cleatus who is in office.

## 2024-02-10 NOTE — Telephone Encounter (Signed)
 I don't see [facility] eval yesterday. Can we call for update on symptoms, see where he went to UC? Thanks.

## 2024-02-10 NOTE — Telephone Encounter (Signed)
 Left message to return call to our office.

## 2024-02-14 ENCOUNTER — Ambulatory Visit (INDEPENDENT_AMBULATORY_CARE_PROVIDER_SITE_OTHER): Admitting: Family Medicine

## 2024-02-14 ENCOUNTER — Encounter: Payer: Self-pay | Admitting: Family Medicine

## 2024-02-14 VITALS — BP 120/68 | HR 60 | Temp 98.2°F | Ht 64.5 in | Wt 174.0 lb

## 2024-02-14 DIAGNOSIS — U071 COVID-19: Secondary | ICD-10-CM | POA: Diagnosis not present

## 2024-02-14 DIAGNOSIS — I10 Essential (primary) hypertension: Secondary | ICD-10-CM

## 2024-02-14 DIAGNOSIS — R059 Cough, unspecified: Secondary | ICD-10-CM

## 2024-02-14 DIAGNOSIS — E1169 Type 2 diabetes mellitus with other specified complication: Secondary | ICD-10-CM | POA: Diagnosis not present

## 2024-02-14 LAB — POC COVID19 BINAXNOW: SARS Coronavirus 2 Ag: POSITIVE — AB

## 2024-02-14 NOTE — Assessment & Plan Note (Addendum)
 This is first time he contracts COVID. Reviewed currently approved antiviral treatments - currently outside of window for treatment.  Reviewed expected course of illness, anticipated course of recovery, as well as red flags to suggest COVID pneumonia and/or to seek urgent in-person care. Reviewed CDC isolation/quarantine guidelines.  Encouraged fluids and rest. Reviewed further supportive care measures at home including vit C 500mg  bid, vit D 2000 IU daily, zinc 100mg  daily, tylenol PRN, pepcid 20mg  BID PRN.   Recommend:  Supportive measures as per above.

## 2024-02-14 NOTE — Patient Instructions (Addendum)
 You did test positive for COVID infection today Encourage fluids and rest. May try vit C 500mg  twice daily, vit D 2000 IU daily, zinc 100mg  daily, tylenol as needed over next few days as supportive measures   Let us  know if fever >101, worsening productive cough, or more trouble catching your breath for further evaluation/recommendations.

## 2024-02-14 NOTE — Telephone Encounter (Signed)
 Pt had OV today at 2:00 with Dr KANDICE.

## 2024-02-14 NOTE — Progress Notes (Signed)
 Ph: [phone] Fax: [phone]   Patient ID: Jeffrey Villarreal, male    DOB: Aug 03, 1940, 83 y.o.   MRN: [id number]  This visit was conducted in person.  BP 120/68   Pulse 60   Temp 98.2 F (36.8 C) (Oral)   Ht 5' 4.5 (1.638 m)   Wt 174 lb (78.9 kg)   SpO2 99%   BMI 29.41 kg/m    CC: cough, congestion Subjective:   HPI: Jeffrey Villarreal is a 83 y.o. male presenting on 02/14/2024 for Cough (Productive, denies any fever. )   1 wk h/o cough, sneezing, head > chest congestion, clear mucous, loose stools for several days, ST today.   First day of symptoms: 02/07/2024 Tested COVID positive: 02/14/2024  Current symptoms: cough - that is improving, ST.  No: fevers/chills, ear or tooth pain, abd pain, nausea, fatigue, or dyspnea or wheezing Cough is not keeping him up at night.  Wife also sick recently.   Has not had COVID in the past.   Treatments to date: ibuprofen  Risk factors include: HTN, age, gender, DM  COVID vaccination status: x at least 3      Relevant past medical, surgical, family and social history reviewed and updated as indicated. Interim medical history since our last visit reviewed. Allergies and medications reviewed and updated. Outpatient Medications Prior to Visit  Medication Sig Dispense Refill   amLODipine  (NORVASC ) 10 MG tablet Take 1 tablet (10 mg total) by mouth daily. 90 tablet 3   aspirin EC 81 MG tablet Take 81 mg by mouth daily.     hydrALAZINE  (APRESOLINE ) 25 MG tablet Take 1 tablet (25 mg total) by mouth in the morning and at bedtime. 180 tablet 3   lisinopril  (ZESTRIL ) 20 MG tablet Take 1 tablet (20 mg total) by mouth daily. 90 tablet 3   polyethylene glycol powder (GLYCOLAX /MIRALAX ) powder Take 17 g by mouth daily as needed for moderate constipation. 850 g 1   pravastatin  (PRAVACHOL ) 80 MG tablet Take 1 tablet (80 mg total) by mouth daily. 90 tablet 3   Vitamin D , Ergocalciferol , (DRISDOL ) 1.25 MG (50000 UNIT) CAPS capsule Take 1 capsule  (50,000 Units total) by mouth once a week. 12 capsule 3   No facility-administered medications prior to visit.     Per HPI unless specifically indicated in ROS section below Review of Systems  Objective:  BP 120/68   Pulse 60   Temp 98.2 F (36.8 C) (Oral)   Ht 5' 4.5 (1.638 m)   Wt 174 lb (78.9 kg)   SpO2 99%   BMI 29.41 kg/m   Wt Readings from Last 3 Encounters:  02/14/24 174 lb (78.9 kg)  01/09/24 179 lb 8 oz (81.4 kg)  10/13/23 181 lb 3.2 oz (82.2 kg)      Physical Exam Vitals and nursing note reviewed.  Constitutional:      Appearance: Normal appearance. He is not ill-appearing.  HENT:     Head: Normocephalic and atraumatic.     Right Ear: Hearing, ear canal and external ear normal. There is impacted cerumen.     Left Ear: Hearing, ear canal and external ear normal. There is impacted cerumen.     Nose:     Right Sinus: No maxillary sinus tenderness or frontal sinus tenderness.     Left Sinus: No maxillary sinus tenderness or frontal sinus tenderness.     Mouth/Throat:     Comments: Wearing mask Eyes:     Extraocular Movements: Extraocular  movements intact.     Conjunctiva/sclera: Conjunctivae normal.     Pupils: Pupils are equal, round, and reactive to light.  Cardiovascular:     Rate and Rhythm: Normal rate and regular rhythm.     Pulses: Normal pulses.     Heart sounds: Murmur (3/6 mid systolic murmur throughout) heard.  Pulmonary:     Effort: Pulmonary effort is normal. No respiratory distress.     Breath sounds: Normal breath sounds. No wheezing, rhonchi or rales.     Comments: Lungs clear Musculoskeletal:     Cervical back: Normal range of motion and neck supple. No rigidity.     Right lower leg: No edema.     Left lower leg: No edema.  Lymphadenopathy:     Cervical: No cervical adenopathy.  Skin:    General: Skin is warm and dry.     Findings: No rash.  Neurological:     Mental Status: He is alert.  Psychiatric:        Mood and Affect: Mood  normal.        Behavior: Behavior normal.       Results for orders placed or performed in visit on 02/14/24  POC COVID-19   Collection Time: 02/14/24  2:03 PM  Result Value Ref Range   SARS Coronavirus 2 Ag Positive (A) Negative    Assessment & Plan:   Problem List Items Addressed This Visit     Hypertension   Type 2 diabetes mellitus with other specified complication (HCC)   COVID-19 virus infection - Primary   This is first time he contracts COVID. Reviewed currently approved antiviral treatments - currently outside of window for treatment.  Reviewed expected course of illness, anticipated course of recovery, as well as red flags to suggest COVID pneumonia and/or to seek urgent in-person care. Reviewed CDC isolation/quarantine guidelines.  Encouraged fluids and rest. Reviewed further supportive care measures at home including vit C 500mg  bid, vit D 2000 IU daily, zinc 100mg  daily, tylenol PRN, pepcid 20mg  BID PRN.   Recommend:  Supportive measures as per above.       Other Visit Diagnoses       Cough, unspecified type       Relevant Orders   POC COVID-19 (Completed)        No orders of the defined types were placed in this encounter.   Orders Placed This Encounter  Procedures   POC COVID-19    Patient Instructions  You did test positive for COVID infection today Encourage fluids and rest. May try vit C 500mg  twice daily, vit D 2000 IU daily, zinc 100mg  daily, tylenol as needed over next few days as supportive measures   Let us  know if fever >101, worsening productive cough, or more trouble catching your breath for further evaluation/recommendations.   Follow up plan: Return if symptoms worsen or fail to improve.  Anton Blas, MD

## 2024-02-23 ENCOUNTER — Telehealth: Payer: Self-pay

## 2024-02-23 DIAGNOSIS — Z23 Encounter for immunization: Secondary | ICD-10-CM

## 2024-02-23 MED ORDER — COVID-19 MRNA VAC-TRIS(PFIZER) 30 MCG/0.3ML IM SUSY: 0.3000 mL | PREFILLED_SYRINGE | Freq: Once | INTRAMUSCULAR | 0 refills | Status: AC

## 2024-02-23 NOTE — Telephone Encounter (Signed)
 I signed COVID Rx. Thanks.

## 2024-02-23 NOTE — Telephone Encounter (Signed)
 Copied from CRM [id number]. Topic: Clinical - Medication Question >> Feb 23, 2024 11:08 AM Thersia BROCKS wrote: Reason for CRM: Patient called in would like covid prescription sent to  [id number] - [place], [place] - [address] AT [facility] [address] [place] [address] Phone: [phone] Fax: [phone]

## 2024-02-24 ENCOUNTER — Encounter: Payer: Self-pay | Admitting: Podiatry

## 2024-02-24 ENCOUNTER — Ambulatory Visit (INDEPENDENT_AMBULATORY_CARE_PROVIDER_SITE_OTHER): Admitting: Podiatry

## 2024-02-24 DIAGNOSIS — M79675 Pain in left toe(s): Secondary | ICD-10-CM

## 2024-02-24 DIAGNOSIS — N289 Disorder of kidney and ureter, unspecified: Secondary | ICD-10-CM

## 2024-02-24 DIAGNOSIS — E1169 Type 2 diabetes mellitus with other specified complication: Secondary | ICD-10-CM | POA: Diagnosis not present

## 2024-02-24 DIAGNOSIS — B351 Tinea unguium: Secondary | ICD-10-CM

## 2024-02-24 DIAGNOSIS — M79674 Pain in right toe(s): Secondary | ICD-10-CM

## 2024-02-24 NOTE — Progress Notes (Signed)
 This patient returns to my office for at risk foot care.  This patient requires this care by a professional since this patient will be at risk due to having diabetes  and RI.   Patient denies diabetes. This patient is unable to cut nails himself since the patient cannot reach his nails.These nails are painful walking and wearing shoes.   Patient has not been seen in over 6 months. This patient presents for at risk foot care today.  General Appearance  Alert, conversant and in no acute stress.  Vascular  Dorsalis pedis and posterior tibial  pulses are weakly  palpable  bilaterally.  Capillary return is within normal limits  bilaterally. Temperature is within normal limits  bilaterally.  Neurologic  Senn-Weinstein monofilament wire test within normal limits/diminished   bilaterally. Muscle power within normal limits bilaterally.  Nails Thick disfigured discolored nails with subungual debris  from hallux to fifth toes bilaterally. No evidence of bacterial infection or drainage bilaterally.  Orthopedic  No limitations of motion  feet .  No crepitus or effusions noted.  No bony pathology or digital deformities noted.  Skin  normotropic skin with no porokeratosis noted bilaterally.  No signs of infections or ulcers noted.     Onychomycosis  Pain in right toes  Pain in left toes  Consent was obtained for treatment procedures.   Mechanical debridement of nails 1-5  bilaterally performed with a nail nipper.  Filed with dremel without incident.    Return office visit    12 weeks                 Told patient to return for periodic foot care and evaluation due to potential at risk complications.   Cordella Bold DPM

## 2024-02-29 ENCOUNTER — Ambulatory Visit

## 2024-03-15 ENCOUNTER — Ambulatory Visit: Payer: Medicare Other

## 2024-03-20 ENCOUNTER — Ambulatory Visit

## 2024-03-27 ENCOUNTER — Ambulatory Visit (INDEPENDENT_AMBULATORY_CARE_PROVIDER_SITE_OTHER)

## 2024-03-27 ENCOUNTER — Telehealth: Payer: Self-pay

## 2024-03-27 DIAGNOSIS — D510 Vitamin B12 deficiency anemia due to intrinsic factor deficiency: Secondary | ICD-10-CM

## 2024-03-27 DIAGNOSIS — E538 Deficiency of other specified B group vitamins: Secondary | ICD-10-CM

## 2024-03-27 MED ORDER — VITAMIN B-12 1000 MCG PO TABS: 1000.0000 ug | ORAL_TABLET | Freq: Every day | ORAL | Status: AC

## 2024-03-27 MED ORDER — CYANOCOBALAMIN 1000 MCG/ML IJ SOLN
1000.0000 ug | Freq: Once | INTRAMUSCULAR | Status: AC
  Administered 2024-03-27: 1000 ug via INTRAMUSCULAR

## 2024-03-27 NOTE — Telephone Encounter (Signed)
 Pt had nurse visit today for vitamin b 12 inj. Pt said it was his last injection. Per 12/20/23 OV Dr KANDICE said B 12 inj x 6 months and then would reassess. Pt said today was supposed to be his last B 12 inj. Sending FYI to Dr KANDICE.

## 2024-03-27 NOTE — Progress Notes (Signed)
 Per orders of Dr. Anton Blas, injection of vitamin b 12 given by Laray Arenas in right deltoid. Patient tolerated injection well. Pt said this was last B 12 inj.

## 2024-03-27 NOTE — Telephone Encounter (Signed)
 Recommend he start taking b12 vitamin 1000mcg daily OTC, ideally dissolvable tablets under the tongue if he can find them. However, he should know that in h/o pernicious anemia he may need to continue monthly shots due to trouble with absorption of oral B12.  I recommend he start oral b12 1000mcg daily OTC and schedule lab visit in 4-6 weeks to recheck b12 levels on oral replacement to ensure staying well controlled. If again low, then will need to continue monthly b12 shots indefinitely  B12 lab ordered.

## 2024-03-28 NOTE — Telephone Encounter (Signed)
 Spoke with pt relaying Dr Talmadge message. Pt verbalizes understanding and will call around to see if anyone has dissolvable vit B12 100 mcg tabs. Also, pt scheduled lab visit on 05/01/24 at 10:15 to recheck vit B12.

## 2024-03-30 ENCOUNTER — Telehealth: Payer: Self-pay

## 2024-03-30 NOTE — Telephone Encounter (Signed)
 Dissolvable tablets are better absorbed because the absorption starts in the mouth  With his issue (pernicious anemia) his GI tract b12 absorption is likely more limited so ideally would get dissolvable sublingual tablets however these are generally harder to find.

## 2024-03-30 NOTE — Telephone Encounter (Signed)
 Copied from CRM [id number]. Topic: Clinical - Medication Question >> Mar 30, 2024  9:18 AM Viola F wrote: Reason for CRM: Patient spouse Sherron called regarding the b12 vitamin 1000mcg daily OTC - they spoke with medical assistant yesterday and wants to know what's the difference between getting the dissolvable tables and the hard tablets? Please call [phone] (H)

## 2024-03-30 NOTE — Telephone Encounter (Signed)
 Left message to return call to our office.

## 2024-03-30 NOTE — Telephone Encounter (Unsigned)
 Copied from CRM [id number]. Topic: General - Other >> Mar 30, 2024  2:39 PM Alfonso HERO wrote: Reason for CRM: patients wife called back we sat on hold for a long time so I decided to just send you a direct message. The patients wife is asking that you call her back she missed your call thinking you were spam. If she doesn't answer you can leave her a detailed message.

## 2024-04-02 NOTE — Telephone Encounter (Signed)
 Called wife reviewed b12 instructions with her. She will order some now.   While on the phone wanted to know if he can be sent anything for productive cough. Started about 3 days ago. It has improved with over the counter medications. Patient denies any SOB, wheezing or fever. Advised patient to continue otc meds and make sure getting plenty of water. If does not continue to improve to give our office a call to be seen. Have reviewed all red words with wife and she repeated back to me if any will get seen asap.

## 2024-04-19 ENCOUNTER — Encounter: Payer: Self-pay | Admitting: Cardiology

## 2024-04-19 ENCOUNTER — Ambulatory Visit: Attending: Cardiology | Admitting: Cardiology

## 2024-04-19 ENCOUNTER — Other Ambulatory Visit: Payer: Self-pay | Admitting: *Deleted

## 2024-04-19 VITALS — BP 161/76 | HR 64 | Resp 16 | Ht 64.0 in | Wt 177.8 lb

## 2024-04-19 DIAGNOSIS — I10 Essential (primary) hypertension: Secondary | ICD-10-CM

## 2024-04-19 DIAGNOSIS — I350 Nonrheumatic aortic (valve) stenosis: Secondary | ICD-10-CM | POA: Diagnosis not present

## 2024-04-19 DIAGNOSIS — R072 Precordial pain: Secondary | ICD-10-CM

## 2024-04-19 DIAGNOSIS — I6523 Occlusion and stenosis of bilateral carotid arteries: Secondary | ICD-10-CM | POA: Diagnosis not present

## 2024-04-19 DIAGNOSIS — Z79899 Other long term (current) drug therapy: Secondary | ICD-10-CM

## 2024-04-19 DIAGNOSIS — E782 Mixed hyperlipidemia: Secondary | ICD-10-CM

## 2024-04-19 MED ORDER — METOPROLOL TARTRATE 50 MG PO TABS: 50.0000 mg | ORAL_TABLET | ORAL | 0 refills | Status: AC

## 2024-04-19 MED ORDER — LISINOPRIL 40 MG PO TABS: 40.0000 mg | ORAL_TABLET | Freq: Every day | ORAL | 3 refills | Status: AC

## 2024-04-19 NOTE — Progress Notes (Signed)
 Cardiology Office Note:    Date:  04/19/2024  NAME:  Jeffrey Villarreal    MRN: [id number] DOB:  [birth year]   PCP:  Rilla Baller, MD  Former Cardiology Providers: N/A Primary Cardiologist:  Madonna Large, DO, [facility] (established care 04/19/2024) Electrophysiologist:  None   Referring MD: Rilla Baller, MD  Reason of Consult: Chest pain, murmur  Chief Complaint  Patient presents with   New Patient (Initial Visit)    Chest pain evaluation    History of Present Illness:    Jeffrey Villarreal is a 83 y.o. African-American male whose past medical history and cardiovascular risk factors includes: Aortic stenosis, carotid artery atherosclerosis, hypertension, hyperlipidemia. He is being seen today for the evaluation of chest pain at the request of Rilla Baller, MD.  Patient was referred to the practice for evaluation of chest pain.  Patient saw his PCP back in April 2025 and endorsed having chest pain severe enough to call EMS to his house.  He later followed up with PCP and cardiology consult was placed, echocardiogram was ordered.  Echocardiogram noted hyperdynamic LV function, grade 1 diastolic dysfunction, right ventricular size and function normal, mild to moderate aortic stenosis.  Chest Pain Onset: 4-5 months ago Last occurrence: yesterday Location: left chest Intensity: 5/10 Frequency: intermittent, daily Duration: hour or so.  Improving factors: none Worsening factors: none Do symptoms worsen with effort related activities: No Do symptoms improve with resting or sublingual nitroglycerin tabs: No Are symptoms self limited: Yes Are symptoms positional / pleuritic: no Risk Factors: Carotid Disease, History of Smoking, and Hypertension   Current Medications: Current Meds  Medication Sig   amLODipine  (NORVASC ) 10 MG tablet Take 1 tablet (10 mg total) by mouth daily.   aspirin EC 81 MG tablet Take 81 mg by mouth daily.   cyanocobalamin  (VITAMIN B12) 1000 MCG tablet  Take 1 tablet (1,000 mcg total) by mouth daily.   hydrALAZINE  (APRESOLINE ) 25 MG tablet Take 1 tablet (25 mg total) by mouth in the morning and at bedtime.   lisinopril  (ZESTRIL ) 40 MG tablet Take 1 tablet (40 mg total) by mouth daily.   metoprolol tartrate (LOPRESSOR) 50 MG tablet Take 1 tablet (50 mg total) by mouth as directed. Take one tablet (2) hours before your CT   polyethylene glycol powder (GLYCOLAX /MIRALAX ) powder Take 17 g by mouth daily as needed for moderate constipation.   pravastatin  (PRAVACHOL ) 80 MG tablet Take 1 tablet (80 mg total) by mouth daily.   Vitamin D , Ergocalciferol , (DRISDOL ) 1.25 MG (50000 UNIT) CAPS capsule Take 1 capsule (50,000 Units total) by mouth once a week.   [DISCONTINUED] lisinopril  (ZESTRIL ) 20 MG tablet Take 1 tablet (20 mg total) by mouth daily.     Allergies:    Patient has no known allergies.   Past Medical History: Past Medical History:  Diagnosis Date   Carotid stenosis 08/2014   1-39% bilateral, rpt US  2 yrs   Diet-controlled type 2 diabetes mellitus (HCC)    DSME 02/2013   History of seizures as a child remote   Hyperlipidemia    Hypertension     Past Surgical History: Past Surgical History:  Procedure Laterality Date   CATARACT EXTRACTION Right 2013   COLONOSCOPY  08/2009   large int hemorrhoids, benign lymphoid polyp, rpt due 10 yrs Verlee)   DOBUTAMINE  STRESS ECHO  2016   WNL Laverna)    Social History: Social History   Tobacco Use   Smoking status: Former    Current  packs/day: 0.00    Types: Cigarettes    Quit date: 06/14/1985    Years since quitting: 38.8    Passive exposure: Past   Smokeless tobacco: Never  Vaping Use   Vaping status: Never Used  Substance Use Topics   Alcohol use: No    Comment: Quit 1987   Drug use: No    Family History: Family History  Problem Relation Age of Onset   Hypertension Mother    Hypertension Father    Cancer Sister        lung   Cancer Sister        lung   Coronary artery  disease Neg Hx    Stroke Neg Hx    Diabetes Neg Hx    Kidney disease Neg Hx    Hyperlipidemia Neg Hx     ROS:   Review of Systems  Cardiovascular:  Positive for chest pain. Negative for claudication, irregular heartbeat, leg swelling, near-syncope, orthopnea, palpitations, paroxysmal nocturnal dyspnea and syncope.  Respiratory:  Negative for shortness of breath.   Hematologic/Lymphatic: Negative for bleeding problem.    EKGs/Labs/Other Studies Reviewed:   EKG: EKG Interpretation Date/Time:  Thursday April 19 2024 11:12:03 EST Ventricular Rate:  62 PR Interval:  190 QRS Duration:  102 QT Interval:  396 QTC Calculation: 401 R Axis:   -55  Text Interpretation: Sinus rhythm with Premature atrial complexes in a pattern of bigeminy Left axis deviation Incomplete right bundle branch block Possible Anterior infarct , age undetermined When compared with ECG of 17-Dec-1999 05:27, Premature atrial complexes are now Present Incomplete right bundle branch block is now Present Borderline criteria for Anterior infarct are now Present Confirmed by Amarie Viles [phone]) on 04/19/2024 11:44:31 AM  Echocardiogram: 11/29/2023  1. Left ventricular ejection fraction, by estimation, is 70 to 75%. The  left ventricle has hyperdynamic function. The left ventricle has no  regional wall motion abnormalities. There is moderate concentric left  ventricular hypertrophy. Left ventricular  diastolic parameters are consistent with Grade I diastolic dysfunction  (impaired relaxation).   2. Right ventricular systolic function is normal. The right ventricular  size is normal. There is normal pulmonary artery systolic pressure. The  estimated right ventricular systolic pressure is 33.9 mmHg.   3. The mitral valve is abnormal. Trivial mitral valve regurgitation. No  evidence of mitral stenosis.   4. The aortic valve is calcified. The right coronary cusp is fixed. The  aortic valve is tricuspid. There is moderate  calcification of the aortic  valve. There is moderate thickening of the aortic valve. Aortic valve  regurgitation is not visualized. Mild  to moderate aortic valve stenosis. Aortic valve area, by VTI measures 1.37  cm. Aortic valve mean gradient measures 11.2 mmHg. Aortic valve Vmax  measures 2.27 m/s.   5. The inferior vena cava is normal in size with greater than 50%  respiratory variability, suggesting right atrial pressure of 3 mmHg.   Carotid duplex: 10/2022 Right Carotid: Velocities in the right ICA are consistent with a 1-39%  stenosis.   Left Carotid: Velocities in the left ICA are consistent with a 1-39%  stenosis.   Vertebrals: Bilateral vertebral arteries demonstrate antegrade flow.  Subclavians: Normal flow hemodynamics were seen in bilateral subclavian               arteries.   Labs:    Latest Ref Rng & Units 10/07/2023    9:08 AM 10/13/2022   12:49 PM 07/31/2021    8:19 AM  CBC  WBC 4.0 - 10.5 K/uL 5.1  6.0  6.7   Hemoglobin 13.0 - 17.0 g/dL 85.7  85.2  85.2   Hematocrit 39.0 - 52.0 % 42.3  43.2  43.6   Platelets 150.0 - 400.0 K/uL 279.0  308.0  344.0        Latest Ref Rng & Units 10/07/2023    9:08 AM 10/13/2022   12:49 PM 07/31/2021    8:19 AM  BMP  Glucose 70 - 99 mg/dL 896  97  878   BUN 6 - 23 mg/dL 17  13  11    Creatinine 0.40 - 1.50 mg/dL 8.72  8.64  8.80   Sodium 135 - 145 mEq/L 141  140  140   Potassium 3.5 - 5.1 mEq/L 3.9  4.3  4.1   Chloride 96 - 112 mEq/L 104  104  103   CO2 19 - 32 mEq/L 30  29  33   Calcium  8.4 - 10.5 mg/dL 9.3  9.5  9.7       Latest Ref Rng & Units 10/07/2023    9:08 AM 10/13/2022   12:49 PM 07/31/2021    8:19 AM  CMP  Glucose 70 - 99 mg/dL 896  97  878   BUN 6 - 23 mg/dL 17  13  11    Creatinine 0.40 - 1.50 mg/dL 8.72  8.64  8.80   Sodium 135 - 145 mEq/L 141  140  140   Potassium 3.5 - 5.1 mEq/L 3.9  4.3  4.1   Chloride 96 - 112 mEq/L 104  104  103   CO2 19 - 32 mEq/L 30  29  33   Calcium  8.4 - 10.5 mg/dL 9.3  9.5  9.7    Total Protein 6.0 - 8.3 g/dL  7.1  7.4   Total Bilirubin 0.2 - 1.2 mg/dL  0.5  0.5   Alkaline Phos 39 - 117 U/L  90  97   AST 0 - 37 U/L  26  18   ALT 0 - 53 U/L  19  15     Lab Results  Component Value Date   CHOL 109 10/07/2023   HDL 42.40 10/07/2023   LDLCALC 55 10/07/2023   LDLDIRECT 86.0 02/20/2015   TRIG 60.0 10/07/2023   CHOLHDL 3 10/07/2023   No results for input(s): LIPOA in the last 8760 hours. No components found for: NTPROBNP No results for input(s): PROBNP in the last 8760 hours. Recent Labs    07/22/23 1048  TSH 2.26    Physical Exam:    Today's Vitals   04/19/24 1110  BP: (!) 161/76  Pulse: 64  Resp: 16  SpO2: 99%  Weight: 177 lb 12.8 oz (80.6 kg)  Height: 5' 4 (1.626 m)   Body mass index is 30.52 kg/m. Wt Readings from Last 3 Encounters:  04/19/24 177 lb 12.8 oz (80.6 kg)  02/14/24 174 lb (78.9 kg)  01/09/24 179 lb 8 oz (81.4 kg)    Physical Exam  Constitutional: No distress.  hemodynamically stable  Neck: No JVD present.  Cardiovascular: Normal rate, regular rhythm, S1 normal and S2 normal. Exam reveals no gallop, no S3 and no S4.  Murmur heard. Harsh midsystolic murmur is present with a grade of 3/6 at the upper right sternal border radiating to the neck. Pulses:      Carotid pulses are  on the right side with bruit and  on the left side with bruit. Pulmonary/Chest: Effort normal and  breath sounds normal. No stridor. He has no wheezes. He has no rales.  Musculoskeletal:        General: No edema.     Cervical back: Neck supple.  Skin: Skin is warm.     Impression & Recommendation(s):  Impression:   ICD-10-CM   1. Precordial pain  R07.2 metoprolol tartrate (LOPRESSOR) 50 MG tablet    CT CORONARY MORPH W/CTA COR W/SCORE W/CA W/CM &/OR WO/CM    2. Benign hypertension  I10 EKG 12-Lead    lisinopril  (ZESTRIL ) 40 MG tablet    Basic metabolic panel with GFR    3. Nonrheumatic aortic valve stenosis  I35.0     4. Atherosclerosis  of both carotid arteries  I65.23     5. Mixed hyperlipidemia  E78.2        Recommendation(s):  Precordial pain Predominantly noncardiac. Ongoing for the last 4 to 5 months EKG nonischemic Echo will be ordered to evaluate for structural heart disease and left ventricular systolic function. Coronary CTA to evaluate for CAC, plaque burden, and obstructive disease Lopressor to 50 mg p.o. x one, 2 hours prior to the scan Follow-up in 8 weeks or sooner if needed  Benign hypertension Office blood pressures are not well-controlled. Home blood pressures are not checked. Recommend increasing lisinopril  to 40 mg p.o. daily. BMP in one week to check renal function and electrolytes  Nonrheumatic aortic valve stenosis Asymptomatic Mild to moderate aortic stenosis Recommend 1 year follow-up study to evaluate disease progression, will order at the next visit  Atherosclerosis of both carotid arteries Recent carotid duplex notes disease bilaterally <40% Currently on aspirin and lipid-lowering agents LDL is well-controlled  Mixed hyperlipidemia Currently on pravastatin  80 mg p.o. daily.   He denies myalgia or other side effects. Most recent lipids dated 09/2023, independently reviewed as noted above.  LDL is 55 mg/dL.  Cardiology is following peripherally.  Orders Placed:  Orders Placed This Encounter  Procedures   CT CORONARY MORPH W/CTA COR W/SCORE W/CA W/CM &/OR WO/CM    Standing Status:   Future    Expected Date:   04/26/2024    Expiration Date:   04/19/2025    If indicated for the ordered procedure, I authorize the administration of contrast media per Radiology protocol:   Yes    [facility]   Basic metabolic panel with GFR    Standing Status:   Future    Number of Occurrences:   1    Expected Date:   04/26/2024    Expiration Date:   04/19/2025   EKG 12-Lead     Final Medication List:    Meds ordered this encounter  Medications    lisinopril  (ZESTRIL ) 40 MG tablet    Sig: Take 1 tablet (40 mg total) by mouth daily.    Dispense:  90 tablet    Refill:  3   metoprolol tartrate (LOPRESSOR) 50 MG tablet    Sig: Take 1 tablet (50 mg total) by mouth as directed. Take one tablet (2) hours before your CT    Dispense:  1 tablet    Refill:  0    Medications Discontinued During This Encounter  Medication Reason   lisinopril  (ZESTRIL ) 20 MG tablet Dose change     Current Outpatient Medications:    amLODipine  (NORVASC ) 10 MG tablet, Take 1 tablet (10 mg total) by mouth daily., Disp: 90 tablet, Rfl: 3   aspirin EC 81 MG tablet, Take 81 mg  by mouth daily., Disp: , Rfl:    cyanocobalamin  (VITAMIN B12) 1000 MCG tablet, Take 1 tablet (1,000 mcg total) by mouth daily., Disp: , Rfl:    hydrALAZINE  (APRESOLINE ) 25 MG tablet, Take 1 tablet (25 mg total) by mouth in the morning and at bedtime., Disp: 180 tablet, Rfl: 3   lisinopril  (ZESTRIL ) 40 MG tablet, Take 1 tablet (40 mg total) by mouth daily., Disp: 90 tablet, Rfl: 3   metoprolol tartrate (LOPRESSOR) 50 MG tablet, Take 1 tablet (50 mg total) by mouth as directed. Take one tablet (2) hours before your CT, Disp: 1 tablet, Rfl: 0   polyethylene glycol powder (GLYCOLAX /MIRALAX ) powder, Take 17 g by mouth daily as needed for moderate constipation., Disp: 850 g, Rfl: 1   pravastatin  (PRAVACHOL ) 80 MG tablet, Take 1 tablet (80 mg total) by mouth daily., Disp: 90 tablet, Rfl: 3   Vitamin D , Ergocalciferol , (DRISDOL ) 1.25 MG (50000 UNIT) CAPS capsule, Take 1 capsule (50,000 Units total) by mouth once a week., Disp: 12 capsule, Rfl: 3  Consent:   N/A  Disposition:   8-week follow-up sooner if needed Patient may be asked to follow-up sooner based on the results of the above-mentioned testing.  His questions and concerns were addressed to his satisfaction. He voices understanding of the recommendations provided during this encounter.    Signed, Madonna Michele HAS, [facility]  [facility]  A Division of [facility] [address] [place], [place] [address]  04/19/2024 1:03 PM

## 2024-04-19 NOTE — Patient Instructions (Signed)
 Medication Instructions:  Please increase Lisinopril  to 40 mg a day. Continue all other medications as listed.  *If you need a refill on your cardiac medications before your next appointment, please call your pharmacy*  Lab Work: Please have blood work in 1 week (BMP) If you have labs (blood work) drawn today and your tests are completely normal, you will receive your results only by: MyChart Message (if you have MyChart) OR A paper copy in the mail If you have any lab test that is abnormal or we need to change your treatment, we will call you to review the results.  Testing/Procedures:   Your cardiac CT will be scheduled at:   [facility] [address]  [place], [place] [address]  Please enter the parking lot using the [address] entrance and use the FREE valet service at the patient drop-off area. Enter the building and check-in with registration on the main floor.  Please follow these instructions carefully (unless otherwise directed):  An IV will be required for this test and Nitroglycerin will be given.  Hold all erectile dysfunction medications at least 3 days (72 hrs) prior to test. (Ie viagra, cialis, sildenafil, tadalafil, etc)   On the Night Before the Test: Be sure to Drink plenty of water. Do not consume any caffeinated/decaffeinated beverages or chocolate 12 hours prior to your test. Do not take any antihistamines 12 hours prior to your test.  On the Day of the Test: Drink plenty of water until 1 hour prior to the test. Do not eat any food 1 hour prior to test. You may take your regular medications prior to the test.  Take metoprolol (Lopressor) two hours prior to test. If you take Furosemide/Hydrochlorothiazide /Spironolactone/Chlorthalidone, please HOLD on the morning of the test. Patients who wear a continuous glucose monitor MUST remove the device prior to scanning.   After the Test: Drink plenty of water. After receiving  IV contrast, you may experience a mild flushed feeling. This is normal. On occasion, you may experience a mild rash up to 24 hours after the test. This is not dangerous. If this occurs, you can take Benadryl 25 mg, Zyrtec, Claritin, or Allegra and increase your fluid intake. (Patients taking Tikosyn should avoid Benadryl, and may take Zyrtec, Claritin, or Allegra) If you experience trouble breathing, this can be serious. If it is severe call 911 IMMEDIATELY. If it is mild, please call our office.  We will call to schedule your test 2-4 weeks out understanding that some insurance companies will need an authorization prior to the service being performed.   For more information and frequently asked questions, please visit our website : [email or web address]  For non-scheduling related questions, please contact the cardiac imaging nurse navigator should you have any questions/concerns: Cardiac Imaging Nurse Navigators Direct Office Dial: [phone]   For scheduling needs, including cancellations and rescheduling, please call Brittany, [phone].  Follow-Up: At [facility], you and your health needs are our priority.  As part of our continuing mission to provide you with exceptional heart care, our providers are all part of one team.  This team includes your primary Cardiologist (physician) and Advanced Practice Providers or APPs (Physician Assistants and Nurse Practitioners) who all work together to provide you with the care you need, when you need it.  Your next appointment:   8 week(s)  Provider:   Dr Michele      We recommend signing up for the patient portal called MyChart.  Sign  up information is provided on this After Visit Summary.  MyChart is used to connect with patients for Virtual Visits (Telemedicine).  Patients are able to view lab/test results, encounter notes, upcoming appointments, etc.  Non-urgent messages can be sent to your provider as well.   To  learn more about what you can do with MyChart, go to [email or web address].

## 2024-04-25 ENCOUNTER — Ambulatory Visit: Payer: Self-pay

## 2024-04-25 NOTE — Telephone Encounter (Signed)
  FYI Only or Action Required?: FYI only for provider: appointment scheduled on 04/27/2024.  Patient was last seen in primary care on 02/14/2024 by Rilla Baller, MD.  Called Nurse Triage reporting Shoulder Pain.  Symptoms began several weeks ago.  Interventions attempted: OTC medications: tylenol and advil and Prescription medications: muscle relaxers.  Symptoms are: unchanged.  Triage Disposition: See PCP When Office is Open (Within 3 Days)  Patient/caregiver understands and will follow disposition?: Yes  Copied from CRM [id number]. Topic: Clinical - Red Word Triage >> Apr 25, 2024 11:49 AM Drema MATSU wrote: Red Word that prompted transfer to Nurse Triage: Pt arm from left chest/ shoulder down has been hurting for weeks and thinks it could be a pulled muscle. Pt is constantly popping pills and it is not helping.   ----------------------------------------------------------------------- From previous Reason for Contact - Scheduling: Patient/patient representative is calling to schedule an appointment. Refer to attachments for appointment information. Reason for Disposition  [1] MODERATE pain (e.g., interferes with normal activities) AND [2] present > 3 days  Answer Assessment - Initial Assessment Questions 1. ONSET: When did the pain start?     A while ago at least one week ago 2. LOCATION: Where is the pain located?     Left shoulder and left arm 3. PAIN: How bad is the pain? (Scale 1-10; or mild, moderate, severe)     Severe pain 4. WORK OR EXERCISE: Has there been any recent work or exercise that involved this part of the body?     denies 5. CAUSE: What do you think is causing the shoulder pain?     unknown 6. OTHER SYMPTOMS: Do you have any other symptoms? (e.g., neck pain, swelling, rash, fever, numbness, weakness)     Denies  Has been using tylenol, ibuprofen, and muscle relaxers for pain with out relief  Has been evaluated by cardiology this week and  ruled out cardiac cause of pain  Protocols used: Shoulder Pain-A-AH

## 2024-04-25 NOTE — Telephone Encounter (Signed)
 Appointment with Dr. Avelina 03/27/24.

## 2024-04-25 NOTE — Telephone Encounter (Signed)
 Appreciate Dr Avelina seeing this pleasant patient

## 2024-04-27 ENCOUNTER — Encounter: Payer: Self-pay | Admitting: Family Medicine

## 2024-04-27 ENCOUNTER — Ambulatory Visit (INDEPENDENT_AMBULATORY_CARE_PROVIDER_SITE_OTHER)
Admission: RE | Admit: 2024-04-27 | Discharge: 2024-04-27 | Disposition: A | Discharge status: Home / Self Care | Source: Ambulatory Visit | Attending: Family Medicine | Admitting: Family Medicine

## 2024-04-27 ENCOUNTER — Ambulatory Visit: Payer: Self-pay | Admitting: Family Medicine

## 2024-04-27 ENCOUNTER — Ambulatory Visit (INDEPENDENT_AMBULATORY_CARE_PROVIDER_SITE_OTHER): Admitting: Family Medicine

## 2024-04-27 VITALS — BP 128/60 | HR 72 | Temp 98.4°F | Ht 64.5 in | Wt 180.0 lb

## 2024-04-27 DIAGNOSIS — M542 Cervicalgia: Secondary | ICD-10-CM | POA: Diagnosis not present

## 2024-04-27 DIAGNOSIS — R202 Paresthesia of skin: Secondary | ICD-10-CM

## 2024-04-27 DIAGNOSIS — R200 Anesthesia of skin: Secondary | ICD-10-CM

## 2024-04-27 DIAGNOSIS — M25512 Pain in left shoulder: Secondary | ICD-10-CM

## 2024-04-27 MED ORDER — CYCLOBENZAPRINE HCL 5 MG PO TABS: 5.0000 mg | ORAL_TABLET | Freq: Every day | ORAL | 1 refills | Status: AC

## 2024-04-27 MED ORDER — PREDNISONE 20 MG PO TABS
ORAL_TABLET | ORAL | 0 refills | Status: AC
Start: 2024-04-27 — End: ?

## 2024-04-27 NOTE — Progress Notes (Signed)
 Patient ID: Jeffrey Villarreal, male    DOB: Apr 16, 1941, 83 y.o.   MRN: [id number]  This visit was conducted in person.  BP 128/60   Pulse 72   Temp 98.4 F (36.9 C) (Temporal)   Ht 5' 4.5 (1.638 m)   Wt 180 lb (81.6 kg)   SpO2 98%   BMI 30.42 kg/m    CC:  Chief Complaint  Patient presents with   Shoulder Pain    Left-Radiates down arm with some hand numbness    Subjective:   HPI: Jeffrey Villarreal is a 83 y.o. male presenting on 04/27/2024 for Shoulder Pain (Left-Radiates down arm with some hand numbness)    New onset left shoulder pain,  ongoing x 6-7 months. No recent injury, or fall, no change in activity.  Pain mainly  in upper neck/shoulder.. pain radiates to left hand.  Tingling/numbness in left hand... more recnetly.  No weakness.    Using advil 400 mg  tylenol off and on not helping.  Muscle relaxant     Reviewed recent Cardiology OV  04/19/2024    05/2023  Mild to moderate change in right shoulder.   Lab Results  Component Value Date   HGBA1C 6.3 10/07/2023     Relevant past medical, surgical, family and social history reviewed and updated as indicated. Interim medical history since our last visit reviewed. Allergies and medications reviewed and updated. Outpatient Medications Prior to Visit  Medication Sig Dispense Refill   amLODipine  (NORVASC ) 10 MG tablet Take 1 tablet (10 mg total) by mouth daily. 90 tablet 3   aspirin EC 81 MG tablet Take 81 mg by mouth daily.     cyanocobalamin  (VITAMIN B12) 1000 MCG tablet Take 1 tablet (1,000 mcg total) by mouth daily.     hydrALAZINE  (APRESOLINE ) 25 MG tablet Take 1 tablet (25 mg total) by mouth in the morning and at bedtime. 180 tablet 3   lisinopril  (ZESTRIL ) 40 MG tablet Take 1 tablet (40 mg total) by mouth daily. 90 tablet 3   metoprolol tartrate (LOPRESSOR) 50 MG tablet Take 1 tablet (50 mg total) by mouth as directed. Take one tablet (2) hours before your CT 1 tablet 0   polyethylene glycol powder  (GLYCOLAX /MIRALAX ) powder Take 17 g by mouth daily as needed for moderate constipation. 850 g 1   pravastatin  (PRAVACHOL ) 80 MG tablet Take 1 tablet (80 mg total) by mouth daily. 90 tablet 3   Vitamin D , Ergocalciferol , (DRISDOL ) 1.25 MG (50000 UNIT) CAPS capsule Take 1 capsule (50,000 Units total) by mouth once a week. 12 capsule 3   No facility-administered medications prior to visit.     Per HPI unless specifically indicated in ROS section below Review of Systems  Constitutional:  Negative for fatigue and fever.  HENT:  Negative for ear pain.   Eyes:  Negative for pain.  Respiratory:  Negative for cough and shortness of breath.   Cardiovascular:  Negative for chest pain, palpitations and leg swelling.  Gastrointestinal:  Negative for abdominal pain.  Genitourinary:  Negative for dysuria.  Musculoskeletal:  Positive for arthralgias, neck pain and neck stiffness. Negative for myalgias.  Neurological:  Negative for syncope, light-headedness and headaches.  Psychiatric/Behavioral:  Negative for dysphoric mood.    Objective:  BP 128/60   Pulse 72   Temp 98.4 F (36.9 C) (Temporal)   Ht 5' 4.5 (1.638 m)   Wt 180 lb (81.6 kg)   SpO2 98%   BMI 30.42 kg/m  Wt Readings from Last 3 Encounters:  04/27/24 180 lb (81.6 kg)  04/19/24 177 lb 12.8 oz (80.6 kg)  02/14/24 174 lb (78.9 kg)      Physical Exam Constitutional:      Appearance: He is well-developed.  HENT:     Head: Normocephalic.     Right Ear: Hearing normal.     Left Ear: Hearing normal.     Nose: Nose normal.  Neck:     Thyroid : No thyroid  mass or thyromegaly.     Vascular: No carotid bruit.     Trachea: Trachea normal.  Cardiovascular:     Rate and Rhythm: Normal rate and regular rhythm.     Pulses: Normal pulses.     Heart sounds: Heart sounds not distant. No murmur heard.    No friction rub. No gallop.     Comments: No peripheral edema Pulmonary:     Effort: Pulmonary effort is normal. No respiratory  distress.     Breath sounds: Normal breath sounds.  Musculoskeletal:     Right shoulder: Normal.     Left shoulder: Tenderness present. No swelling, deformity, effusion or bony tenderness. Decreased range of motion.     Cervical back: Spasms, tenderness and crepitus present. No bony tenderness. Pain with movement present. Decreased range of motion.     Thoracic back: Normal.     Lumbar back: Normal.     Comments: TTP over trapezius ans well as anterior sub acromial joint Pain with Spurlings but no electric pain down arm  Negative tinel and phalen, negative ulnar compression See if can decrease in range of motion in neck  Skin:    General: Skin is warm and dry.     Findings: No rash.  Psychiatric:        Speech: Speech normal.        Behavior: Behavior normal.        Thought Content: Thought content normal.       Results for orders placed or performed in visit on 02/14/24  POC COVID-19   Collection Time: 02/14/24  2:03 PM  Result Value Ref Range   SARS Coronavirus 2 Ag Positive (A) Negative    Assessment and Plan  Acute neck pain -     DG Cervical Spine Complete; Future  Acute pain of left shoulder -     DG Shoulder Left; Future  Numbness and tingling in left hand -     DG Cervical Spine Complete; Future  Other orders -     predniSONE ; 3 tabs by mouth daily x 3 days, then 2 tabs by mouth daily x 2 days then 1 tab by mouth daily x 2 days  Dispense: 15 tablet; Refill: 0 -     Cyclobenzaprine  HCl; Take 1-2 tablets (5-10 mg total) by mouth at bedtime.  Dispense: 15 tablet; Refill: 1  Acute worsening of chronic upper neck pain and left shoulder pain.  Given element of numbness there is some concern for cervical radiculopathy but patient also has pain with testing of rotator cuff. Will evaluate with plain film of cervical spine as well as left shoulder joint. Will treat with prednisone  taper.  Hold NSAIDs while on but then can restart. Start home physical therapy for left  shoulder but do not do repetitive motion of neck.  Return and ER precautions provided.  No follow-ups on file.   Greig Ring, MD

## 2024-05-01 ENCOUNTER — Other Ambulatory Visit

## 2024-05-08 LAB — BASIC METABOLIC PANEL WITH GFR
BUN/Creatinine Ratio: 10 (ref 10–24)
BUN: 13 mg/dL (ref 8–27)
CO2: 26 mmol/L (ref 20–29)
Calcium: 9.3 mg/dL (ref 8.6–10.2)
Chloride: 102 mmol/L (ref 96–106)
Creatinine, Ser: 1.32 mg/dL — ABNORMAL HIGH (ref 0.76–1.27)
Glucose: 93 mg/dL (ref 70–99)
Potassium: 4.4 mmol/L (ref 3.5–5.2)
Sodium: 140 mmol/L (ref 134–144)
eGFR: 54 mL/min/1.73 — ABNORMAL LOW (ref >59)

## 2024-05-09 ENCOUNTER — Ambulatory Visit: Payer: Self-pay | Admitting: Cardiology

## 2024-05-15 ENCOUNTER — Telehealth: Payer: Self-pay | Admitting: Family Medicine

## 2024-05-15 NOTE — Telephone Encounter (Signed)
 Called patient gave information for office. Will call and set up visit.

## 2024-05-15 NOTE — Telephone Encounter (Signed)
 Copied from CRM #[id number]. Topic: Referral - Question >> May 15, 2024 12:55 PM Deaijah H wrote: Reason for CRM: Patients wife would like the past ENT phone number, missed last appointment

## 2024-05-25 ENCOUNTER — Ambulatory Visit: Admitting: Podiatry

## 2024-05-25 ENCOUNTER — Encounter: Payer: Self-pay | Admitting: Podiatry

## 2024-05-25 DIAGNOSIS — M79674 Pain in right toe(s): Secondary | ICD-10-CM | POA: Diagnosis not present

## 2024-05-25 DIAGNOSIS — B351 Tinea unguium: Secondary | ICD-10-CM

## 2024-05-25 DIAGNOSIS — M79675 Pain in left toe(s): Secondary | ICD-10-CM

## 2024-05-25 DIAGNOSIS — E1169 Type 2 diabetes mellitus with other specified complication: Secondary | ICD-10-CM | POA: Diagnosis not present

## 2024-05-25 NOTE — Progress Notes (Signed)
 This patient returns to my office for at risk foot care.  This patient requires this care by a professional since this patient will be at risk due to having diabetes  and RI.   Patient denies diabetes. This patient is unable to cut nails himself since the patient cannot reach his nails.These nails are painful walking and wearing shoes.   Patient has not been seen in over 6 months. This patient presents for at risk foot care today.  General Appearance  Alert, conversant and in no acute stress.  Vascular  Dorsalis pedis and posterior tibial  pulses are weakly  palpable  bilaterally.  Capillary return is within normal limits  bilaterally. Temperature is within normal limits  bilaterally.  Neurologic  Senn-Weinstein monofilament wire test within normal limits/diminished   bilaterally. Muscle power within normal limits bilaterally.  Nails Thick disfigured discolored nails with subungual debris  from hallux to fifth toes bilaterally. No evidence of bacterial infection or drainage bilaterally.  Orthopedic  No limitations of motion  feet .  No crepitus or effusions noted.  No bony pathology or digital deformities noted.  Skin  normotropic skin with no porokeratosis noted bilaterally.  No signs of infections or ulcers noted.     Onychomycosis  Pain in right toes  Pain in left toes  Consent was obtained for treatment procedures.   Mechanical debridement of nails 1-5  bilaterally performed with a nail nipper.  Filed with dremel without incident.    Return office visit    12 weeks                 Told patient to return for periodic foot care and evaluation due to potential at risk complications.   Cordella Bold DPM

## 2024-06-04 ENCOUNTER — Encounter (HOSPITAL_COMMUNITY): Payer: Self-pay

## 2024-06-08 ENCOUNTER — Ambulatory Visit (HOSPITAL_COMMUNITY)
Admission: RE | Admit: 2024-06-08 | Discharge: 2024-06-08 | Disposition: A | Discharge status: Home / Self Care | Source: Ambulatory Visit | Attending: Cardiology | Admitting: Cardiology

## 2024-06-08 DIAGNOSIS — R072 Precordial pain: Secondary | ICD-10-CM | POA: Insufficient documentation

## 2024-06-08 MED ORDER — DILTIAZEM HCL 25 MG/5ML IV SOLN: 10.0000 mg | INTRAVENOUS | Status: DC | PRN

## 2024-06-08 MED ORDER — METOPROLOL TARTRATE 5 MG/5ML IV SOLN
10.0000 mg | INTRAVENOUS | Status: DC | PRN
  Administered 2024-06-08: 5 mg via INTRAVENOUS

## 2024-06-08 MED ORDER — IOHEXOL 350 MG/ML SOLN
100.0000 mL | Freq: Once | INTRAVENOUS | Status: AC | PRN
  Administered 2024-06-08: 100 mL via INTRAVENOUS

## 2024-06-08 MED ORDER — NITROGLYCERIN 0.4 MG SL SUBL
0.8000 mg | SUBLINGUAL_TABLET | Freq: Once | SUBLINGUAL | Status: AC
  Administered 2024-06-08: 0.8 mg via SUBLINGUAL

## 2024-06-20 ENCOUNTER — Ambulatory Visit: Attending: Cardiovascular Disease | Admitting: Cardiology

## 2024-06-20 ENCOUNTER — Encounter: Payer: Self-pay | Admitting: Cardiology

## 2024-06-20 VITALS — BP 116/63 | HR 73 | Ht 62.0 in | Wt 183.2 lb

## 2024-06-20 DIAGNOSIS — I350 Nonrheumatic aortic (valve) stenosis: Secondary | ICD-10-CM

## 2024-06-20 DIAGNOSIS — I10 Essential (primary) hypertension: Secondary | ICD-10-CM

## 2024-06-20 DIAGNOSIS — I700 Atherosclerosis of aorta: Secondary | ICD-10-CM | POA: Diagnosis not present

## 2024-06-20 DIAGNOSIS — I6523 Occlusion and stenosis of bilateral carotid arteries: Secondary | ICD-10-CM

## 2024-06-20 DIAGNOSIS — I2584 Coronary atherosclerosis due to calcified coronary lesion: Secondary | ICD-10-CM

## 2024-06-20 DIAGNOSIS — E782 Mixed hyperlipidemia: Secondary | ICD-10-CM

## 2024-06-20 NOTE — Progress Notes (Signed)
 "   Cardiology Office Note:    Date:  06/20/2024  NAME:  Jeffrey Villarreal    MRN: [id number] DOB:  1941/01/12   PCP:  Rilla Baller, MD  Former Cardiology Providers: N/A Primary Cardiologist:  Madonna Large, DO, [facility] (established care 04/19/2024) Electrophysiologist:  None   Chief Complaint  Patient presents with   Precordial pain   Follow-up    History of Present Illness:    Jeffrey Villarreal is a 84 y.o. African-American male whose past medical history and cardiovascular risk factors includes: Severe coronary artery calcification, aortic atherosclerosis, aortic stenosis, carotid artery atherosclerosis, hypertension, hyperlipidemia.  Patient was referred to cardiology for evaluation of chest pain and cardiac murmur.    In November 2025 when he establish care his precordial pain was predominantly noncardiac but ongoing for the last 4 to 5 months with multiple cardiovascular risk factors.  Shared decision was to proceed with coronary CTA for further evaluation.  Results of both studies reviewed with him in detail and mentioned below for further reference.  Since last office visit patient denies anginal chest pain or heart failure symptoms. At the last office visit his lisinopril  dose was increased from 20 mg p.o. daily to 40 mg p.o. daily for better blood pressure management as his systolic blood pressures were running greater than 160 mmHg.  Today patient states that after increasing the dose of lisinopril  he has been experiencing soreness in his right wrist and numbness and tingling in his legs.  I reassured him that this is less likely secondary to increase in lisinopril .  He endorses that his blood pressures are better controlled now when compared to before and he overall feels well.  Reviewed the results of the coronary CTA.  He is aware that radiology will read still pending.  Current Medications: Current Meds  Medication Sig   amLODipine  (NORVASC ) 10 MG tablet Take 1 tablet (10 mg  total) by mouth daily.   aspirin EC 81 MG tablet Take 81 mg by mouth daily.   cyanocobalamin  (VITAMIN B12) 1000 MCG tablet Take 1 tablet (1,000 mcg total) by mouth daily.   cyclobenzaprine  (FLEXERIL ) 5 MG tablet Take 1-2 tablets (5-10 mg total) by mouth at bedtime.   hydrALAZINE  (APRESOLINE ) 25 MG tablet Take 1 tablet (25 mg total) by mouth in the morning and at bedtime.   lisinopril  (ZESTRIL ) 40 MG tablet Take 1 tablet (40 mg total) by mouth daily.   metoprolol  tartrate (LOPRESSOR ) 50 MG tablet Take 1 tablet (50 mg total) by mouth as directed. Take one tablet (2) hours before your CT   polyethylene glycol powder (GLYCOLAX /MIRALAX ) powder Take 17 g by mouth daily as needed for moderate constipation.   pravastatin  (PRAVACHOL ) 80 MG tablet Take 1 tablet (80 mg total) by mouth daily.   predniSONE  (DELTASONE ) 20 MG tablet 3 tabs by mouth daily x 3 days, then 2 tabs by mouth daily x 2 days then 1 tab by mouth daily x 2 days   Vitamin D , Ergocalciferol , (DRISDOL ) 1.25 MG (50000 UNIT) CAPS capsule Take 1 capsule (50,000 Units total) by mouth once a week.     Allergies:    Patient has no known allergies.   Past Medical History: Past Medical History:  Diagnosis Date   Carotid stenosis 08/2014   1-39% bilateral, rpt US  2 yrs   Diet-controlled type 2 diabetes mellitus (HCC)    DSME 02/2013   History of seizures as a child remote   Hyperlipidemia    Hypertension  Past Surgical History: Past Surgical History:  Procedure Laterality Date   CATARACT EXTRACTION Right 2013   COLONOSCOPY  08/2009   large int hemorrhoids, benign lymphoid polyp, rpt due 10 yrs Verlee)   DOBUTAMINE  STRESS ECHO  2016   WNL Laverna)    Social History: Social History   Tobacco Use   Smoking status: Former    Current packs/day: 0.00    Types: Cigarettes    Quit date: 06/14/1985    Years since quitting: 39.0    Passive exposure: Past   Smokeless tobacco: Never  Vaping Use   Vaping status: Never Used  Substance  Use Topics   Alcohol use: No    Comment: Quit 1987   Drug use: No    Family History: Family History  Problem Relation Age of Onset   Hypertension Mother    Hypertension Father    Cancer Sister        lung   Cancer Sister        lung   Coronary artery disease Neg Hx    Stroke Neg Hx    Diabetes Neg Hx    Kidney disease Neg Hx    Hyperlipidemia Neg Hx     ROS:   Review of Systems  Cardiovascular:  Negative for chest pain, claudication, irregular heartbeat, leg swelling, near-syncope, orthopnea, palpitations, paroxysmal nocturnal dyspnea and syncope.  Respiratory:  Negative for shortness of breath.   Hematologic/Lymphatic: Negative for bleeding problem.    EKGs/Labs/Other Studies Reviewed:   Echocardiogram: 11/29/2023  1. Left ventricular ejection fraction, by estimation, is 70 to 75%. The  left ventricle has hyperdynamic function. The left ventricle has no  regional wall motion abnormalities. There is moderate concentric left  ventricular hypertrophy. Left ventricular  diastolic parameters are consistent with Grade I diastolic dysfunction  (impaired relaxation).   2. Right ventricular systolic function is normal. The right ventricular  size is normal. There is normal pulmonary artery systolic pressure. The  estimated right ventricular systolic pressure is 33.9 mmHg.   3. The mitral valve is abnormal. Trivial mitral valve regurgitation. No  evidence of mitral stenosis.   4. The aortic valve is calcified. The right coronary cusp is fixed. The  aortic valve is tricuspid. There is moderate calcification of the aortic  valve. There is moderate thickening of the aortic valve. Aortic valve  regurgitation is not visualized. Mild  to moderate aortic valve stenosis. Aortic valve area, by VTI measures 1.37  cm. Aortic valve mean gradient measures 11.2 mmHg. Aortic valve Vmax  measures 2.27 m/s.   5. The inferior vena cava is normal in size with greater than 50%  respiratory  variability, suggesting right atrial pressure of 3 mmHg.   Carotid duplex: 10/2022 Right Carotid: Velocities in the right ICA are consistent with a 1-39% stenosis.   Left Carotid: Velocities in the left ICA are consistent with a 1-39% stenosis.   Vertebrals: Bilateral vertebral arteries demonstrate antegrade flow.  Subclavians: Normal flow hemodynamics were seen in bilateral subclavian arteries.   CCTA 06/08/2024 1. Coronary calcium  score of 802. This was 78th percentile for age-,sex, and race-matched controls.   2. There is mild (25-49%) plaque in the RCA and LAD.  CAD-RADS 2.   3. Normal coronary origin with right dominance.   4. Aotic atherosclerosis.   5. Aortic valve calcification.  6.  Radiology overread is pending   RECOMMENDATIONS: CAD-RADS 2: Mild non-obstructive CAD (25-49%). Consider non-atherosclerotic causes of chest pain. Consider preventive therapy and risk factor modification.  Labs:    Latest Ref Rng & Units 10/07/2023    9:08 AM 10/13/2022   12:49 PM 07/31/2021    8:19 AM  CBC  WBC 4.0 - 10.5 K/uL 5.1  6.0  6.7   Hemoglobin 13.0 - 17.0 g/dL 85.7  85.2  85.2   Hematocrit 39.0 - 52.0 % 42.3  43.2  43.6   Platelets 150.0 - 400.0 K/uL 279.0  308.0  344.0        Latest Ref Rng & Units 05/08/2024    9:28 AM 10/07/2023    9:08 AM 10/13/2022   12:49 PM  BMP  Glucose 70 - 99 mg/dL 93  896  97   BUN 8 - 27 mg/dL 13  17  13    Creatinine 0.76 - 1.27 mg/dL 8.67  8.72  8.64   BUN/Creat Ratio 10 - 24 10     Sodium 134 - 144 mmol/L 140  141  140   Potassium 3.5 - 5.2 mmol/L 4.4  3.9  4.3   Chloride 96 - 106 mmol/L 102  104  104   CO2 20 - 29 mmol/L 26  30  29    Calcium  8.6 - 10.2 mg/dL 9.3  9.3  9.5       Latest Ref Rng & Units 05/08/2024    9:28 AM 10/07/2023    9:08 AM 10/13/2022   12:49 PM  CMP  Glucose 70 - 99 mg/dL 93  896  97   BUN 8 - 27 mg/dL 13  17  13    Creatinine 0.76 - 1.27 mg/dL 8.67  8.72  8.64   Sodium 134 - 144 mmol/L 140  141  140    Potassium 3.5 - 5.2 mmol/L 4.4  3.9  4.3   Chloride 96 - 106 mmol/L 102  104  104   CO2 20 - 29 mmol/L 26  30  29    Calcium  8.6 - 10.2 mg/dL 9.3  9.3  9.5   Total Protein 6.0 - 8.3 g/dL   7.1   Total Bilirubin 0.2 - 1.2 mg/dL   0.5   Alkaline Phos 39 - 117 U/L   90   AST 0 - 37 U/L   26   ALT 0 - 53 U/L   19     Lab Results  Component Value Date   CHOL 109 10/07/2023   HDL 42.40 10/07/2023   LDLCALC 55 10/07/2023   LDLDIRECT 86.0 02/20/2015   TRIG 60.0 10/07/2023   CHOLHDL 3 10/07/2023   No results for input(s): LIPOA in the last 8760 hours. No components found for: NTPROBNP No results for input(s): PROBNP in the last 8760 hours. Recent Labs    07/22/23 1048  TSH 2.26    Physical Exam:    Today's Vitals   06/20/24 0940  BP: 116/63  Pulse: 73  SpO2: 97%  Weight: 183 lb 3.2 oz (83.1 kg)  Height: 5' 2 (1.575 m)   Body mass index is 33.51 kg/m. Wt Readings from Last 3 Encounters:  06/20/24 183 lb 3.2 oz (83.1 kg)  04/27/24 180 lb (81.6 kg)  04/19/24 177 lb 12.8 oz (80.6 kg)    Physical Exam  Constitutional: No distress.  hemodynamically stable  Neck: No JVD present.  Cardiovascular: Normal rate, regular rhythm, S1 normal and S2 normal. Exam reveals no gallop, no S3 and no S4.  Murmur heard. Harsh midsystolic murmur is present with a grade of 3/6 at the upper right sternal border radiating to the neck.  Pulses:      Carotid pulses are  on the right side with bruit and  on the left side with bruit. Pulmonary/Chest: Effort normal and breath sounds normal. No stridor. He has no wheezes. He has no rales.  Musculoskeletal:        General: No edema.     Cervical back: Neck supple.  Skin: Skin is warm.     Impression & Recommendation(s):  Impression:   ICD-10-CM   1. 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 Atherosclerosis of aorta  I70.0     3. Nonrheumatic aortic valve stenosis  I35.0 ECHOCARDIOGRAM COMPLETE     4. Atherosclerosis of both carotid arteries  I65.23     5. Benign hypertension  I10     6. Mixed hyperlipidemia  E78.2         Recommendation(s):  Coronary atherosclerosis due to severely calcified coronary lesion Atherosclerosis of aorta Coronary CTA December 2025: Total CAC 802, mild nonobstructive coronary artery disease Continue aspirin 81 mg p.o. daily. Continue pravastatin  80 mg p.o. nightly Reemphasized the importance of secondary prevention with focus on improving the modifiable cardiovascular risk factors such as glycemic control, lipid management, blood pressure control, weight loss.  Nonrheumatic aortic valve stenosis Asymptomatic. Mild to moderate aortic stenosis, June 2025 Newly discovered in June 2025, will order follow-up echo in June 2026 to evaluate disease progression.  If relatively stable we will space out his follow-up echocardiograms until unless change in clinical status.  Atherosclerosis of both carotid arteries Asymptomatic. Continue aspirin 81 mg p.o. daily. Continue pravastatin  80 mg p.o. nightly  Benign hypertension Office blood pressures are now very well-controlled. Continue amlodipine  10 mg p.o. daily. Continue hydralazine  25 mg p.o. twice daily. Continue lisinopril  40 mg p.o. daily  Mixed hyperlipidemia Continue pravastatin  80 mg p.o. nightly LDL at goal, 55 mg/dL as of April 2025   Orders Placed:  Orders Placed This Encounter  Procedures   ECHOCARDIOGRAM COMPLETE    Standing Status:   Future    Expected Date:   11/12/2024    Expiration Date:   06/20/2025    Where should this test be performed:   [facility]    Does the patient weigh less than or greater than 250 lbs?:   Patient weighs less than 250 lbs    Perflutren DEFINITY (image enhancing agent) should be administered unless hypersensitivity or allergy exist:   Administer Perflutren    Reason for exam-Echo:   Aortic stenosis I35.0     Final Medication List:    No  orders of the defined types were placed in this encounter.   There are no discontinued medications.    Current Outpatient Medications:    amLODipine  (NORVASC ) 10 MG tablet, Take 1 tablet (10 mg total) by mouth daily., Disp: 90 tablet, Rfl: 3   aspirin EC 81 MG tablet, Take 81 mg by mouth daily., Disp: , Rfl:    cyanocobalamin  (VITAMIN B12) 1000 MCG tablet, Take 1 tablet (1,000 mcg total) by mouth daily., Disp: , Rfl:    cyclobenzaprine  (FLEXERIL ) 5 MG tablet, Take 1-2 tablets (5-10 mg total) by mouth at bedtime., Disp: 15 tablet, Rfl: 1   hydrALAZINE  (APRESOLINE ) 25 MG tablet, Take 1 tablet (25 mg total) by mouth in the morning and at bedtime., Disp: 180 tablet, Rfl: 3   lisinopril  (ZESTRIL ) 40 MG tablet, Take 1 tablet (40 mg total) by mouth daily., Disp: 90 tablet, Rfl: 3  metoprolol  tartrate (LOPRESSOR ) 50 MG tablet, Take 1 tablet (50 mg total) by mouth as directed. Take one tablet (2) hours before your CT, Disp: 1 tablet, Rfl: 0   polyethylene glycol powder (GLYCOLAX /MIRALAX ) powder, Take 17 g by mouth daily as needed for moderate constipation., Disp: 850 g, Rfl: 1   pravastatin  (PRAVACHOL ) 80 MG tablet, Take 1 tablet (80 mg total) by mouth daily., Disp: 90 tablet, Rfl: 3   predniSONE  (DELTASONE ) 20 MG tablet, 3 tabs by mouth daily x 3 days, then 2 tabs by mouth daily x 2 days then 1 tab by mouth daily x 2 days, Disp: 15 tablet, Rfl: 0   Vitamin D , Ergocalciferol , (DRISDOL ) 1.25 MG (50000 UNIT) CAPS capsule, Take 1 capsule (50,000 Units total) by mouth once a week., Disp: 12 capsule, Rfl: 3  Consent:   N/A  Disposition:   1 year follow-up sooner if needed  His questions and concerns were addressed to his satisfaction. He voices understanding of the recommendations provided during this encounter.    Signed, Madonna Michele HAS, [facility]  A Division of [facility] [address] [place], [place] [address]   "

## 2024-06-20 NOTE — Patient Instructions (Signed)
 Medication Instructions:  Your physician recommends that you continue on your current medications as directed. Please refer to the Current Medication list given to you today.  *If you need a refill on your cardiac medications before your next appointment, please call your pharmacy*  Lab Work: None ordered If you have labs (blood work) drawn today and your tests are completely normal, you will receive your results only by: MyChart Message (if you have MyChart) OR A paper copy in the mail If you have any lab test that is abnormal or we need to change your treatment, we will call you to review the results.  Testing/Procedures: Echocardiogram in June  Follow-Up: At [facility], you and your health needs are our priority.  As part of our continuing mission to provide you with exceptional heart care, our providers are all part of one team.  This team includes your primary Cardiologist (physician) and Advanced Practice Providers or APPs (Physician Assistants and Nurse Practitioners) who all work together to provide you with the care you need, when you need it.  Your next appointment:   1 year(s)  Provider:   Madonna Large, DO    We recommend signing up for the patient portal called MyChart.  Sign up information is provided on this After Visit Summary.  MyChart is used to connect with patients for Virtual Visits (Telemedicine).  Patients are able to view lab/test results, encounter notes, upcoming appointments, etc.  Non-urgent messages can be sent to your provider as well.   To learn more about what you can do with MyChart, go to [email or web address].   Other Instructions Your physician has requested that you have an echocardiogram. Echocardiography is a painless test that uses sound waves to create images of your heart. It provides your doctor with information about the size and shape of your heart and how well your hearts chambers and valves are working. This procedure takes  approximately one hour. There are no restrictions for this procedure. Please do NOT wear cologne, perfume, aftershave, or lotions (deodorant is allowed). Please arrive 15 minutes prior to your appointment time.  Please note: We ask at that you not bring children with you during ultrasound (echo/ vascular) testing. Due to room size and safety concerns, children are not allowed in the ultrasound rooms during exams. Our front office staff cannot provide observation of children in our lobby area while testing is being conducted. An adult accompanying a patient to their appointment will only be allowed in the ultrasound room at the discretion of the ultrasound technician under special circumstances. We apologize for any inconvenience.

## 2024-08-23 ENCOUNTER — Ambulatory Visit: Admitting: Podiatry

## 2024-10-16 ENCOUNTER — Ambulatory Visit

## 2024-11-19 ENCOUNTER — Ambulatory Visit (HOSPITAL_COMMUNITY)
